# Patient Record
Sex: Male | Born: 1940 | Hispanic: No | Marital: Married | Smoking: Former smoker
Health system: Southern US, Community
[De-identification: ages and names within clinical notes are randomized; demographics above are authoritative.]

## PROBLEM LIST (undated history)

## (undated) DIAGNOSIS — I10 Essential (primary) hypertension: Secondary | ICD-10-CM

## (undated) DIAGNOSIS — N189 Chronic kidney disease, unspecified: Secondary | ICD-10-CM

## (undated) DIAGNOSIS — R918 Other nonspecific abnormal finding of lung field: Secondary | ICD-10-CM

## (undated) DIAGNOSIS — E1169 Type 2 diabetes mellitus with other specified complication: Secondary | ICD-10-CM

## (undated) DIAGNOSIS — E119 Type 2 diabetes mellitus without complications: Secondary | ICD-10-CM

## (undated) DIAGNOSIS — K219 Gastro-esophageal reflux disease without esophagitis: Secondary | ICD-10-CM

## (undated) HISTORY — DX: Other nonspecific abnormal finding of lung field: R91.8

## (undated) HISTORY — DX: Type 2 diabetes mellitus with other specified complication: E11.69

## (undated) HISTORY — PX: OTHER SURGICAL HISTORY: SHX169

## (undated) HISTORY — DX: Chronic kidney disease, unspecified: N18.9

---

## 2020-06-11 DIAGNOSIS — N183 Chronic kidney disease, stage 3 unspecified: Secondary | ICD-10-CM | POA: Insufficient documentation

## 2020-06-11 DIAGNOSIS — K219 Gastro-esophageal reflux disease without esophagitis: Secondary | ICD-10-CM | POA: Insufficient documentation

## 2020-06-11 DIAGNOSIS — Z79899 Other long term (current) drug therapy: Secondary | ICD-10-CM | POA: Insufficient documentation

## 2020-06-14 ENCOUNTER — Other Ambulatory Visit: Payer: Self-pay | Admitting: Internal Medicine

## 2020-06-14 DIAGNOSIS — R918 Other nonspecific abnormal finding of lung field: Secondary | ICD-10-CM

## 2020-06-20 DIAGNOSIS — C3491 Malignant neoplasm of unspecified part of right bronchus or lung: Secondary | ICD-10-CM

## 2020-06-20 HISTORY — DX: Malignant neoplasm of unspecified part of right bronchus or lung: C34.91

## 2020-06-28 ENCOUNTER — Other Ambulatory Visit: Payer: Self-pay

## 2020-06-28 ENCOUNTER — Ambulatory Visit
Admission: RE | Admit: 2020-06-28 | Discharge: 2020-06-28 | Disposition: A | Discharge status: Home / Self Care | Payer: Medicare PPO | Source: Ambulatory Visit | Attending: Internal Medicine | Admitting: Internal Medicine

## 2020-06-28 DIAGNOSIS — R918 Other nonspecific abnormal finding of lung field: Secondary | ICD-10-CM | POA: Diagnosis present

## 2020-06-28 HISTORY — DX: Type 2 diabetes mellitus without complications: E11.9

## 2020-06-28 HISTORY — DX: Essential (primary) hypertension: I10

## 2020-06-28 MED ORDER — IOHEXOL 300 MG/ML  SOLN
75.0000 mL | Freq: Once | INTRAMUSCULAR | Status: AC | PRN
  Administered 2020-06-28: 75 mL via INTRAVENOUS

## 2020-07-04 ENCOUNTER — Other Ambulatory Visit: Payer: Self-pay

## 2020-07-04 ENCOUNTER — Encounter: Payer: Self-pay | Admitting: Internal Medicine

## 2020-07-04 ENCOUNTER — Other Ambulatory Visit: Payer: Self-pay | Admitting: *Deleted

## 2020-07-05 ENCOUNTER — Encounter (INDEPENDENT_AMBULATORY_CARE_PROVIDER_SITE_OTHER): Payer: Self-pay

## 2020-07-05 ENCOUNTER — Inpatient Hospital Stay: Payer: Medicare PPO

## 2020-07-05 ENCOUNTER — Encounter: Payer: Self-pay | Admitting: Internal Medicine

## 2020-07-05 ENCOUNTER — Encounter: Payer: Self-pay | Admitting: *Deleted

## 2020-07-05 ENCOUNTER — Inpatient Hospital Stay: Payer: Medicare PPO | Attending: Internal Medicine | Admitting: Internal Medicine

## 2020-07-05 DIAGNOSIS — Z7982 Long term (current) use of aspirin: Secondary | ICD-10-CM | POA: Diagnosis not present

## 2020-07-05 DIAGNOSIS — C3431 Malignant neoplasm of lower lobe, right bronchus or lung: Secondary | ICD-10-CM | POA: Diagnosis not present

## 2020-07-05 DIAGNOSIS — N189 Chronic kidney disease, unspecified: Secondary | ICD-10-CM | POA: Diagnosis not present

## 2020-07-05 DIAGNOSIS — R918 Other nonspecific abnormal finding of lung field: Secondary | ICD-10-CM | POA: Insufficient documentation

## 2020-07-05 DIAGNOSIS — Z87891 Personal history of nicotine dependence: Secondary | ICD-10-CM | POA: Diagnosis not present

## 2020-07-05 DIAGNOSIS — E1122 Type 2 diabetes mellitus with diabetic chronic kidney disease: Secondary | ICD-10-CM | POA: Diagnosis not present

## 2020-07-05 DIAGNOSIS — E785 Hyperlipidemia, unspecified: Secondary | ICD-10-CM | POA: Diagnosis not present

## 2020-07-05 DIAGNOSIS — I2510 Atherosclerotic heart disease of native coronary artery without angina pectoris: Secondary | ICD-10-CM | POA: Insufficient documentation

## 2020-07-05 DIAGNOSIS — K219 Gastro-esophageal reflux disease without esophagitis: Secondary | ICD-10-CM | POA: Diagnosis not present

## 2020-07-05 DIAGNOSIS — Z79899 Other long term (current) drug therapy: Secondary | ICD-10-CM | POA: Diagnosis not present

## 2020-07-05 DIAGNOSIS — I129 Hypertensive chronic kidney disease with stage 1 through stage 4 chronic kidney disease, or unspecified chronic kidney disease: Secondary | ICD-10-CM | POA: Insufficient documentation

## 2020-07-05 LAB — COMPREHENSIVE METABOLIC PANEL
ALT: 34 U/L (ref 0–44)
AST: 18 U/L (ref 15–41)
Albumin: 3.5 g/dL (ref 3.5–5.0)
Alkaline Phosphatase: 67 U/L (ref 38–126)
Anion gap: 10 (ref 5–15)
BUN: 26 mg/dL — ABNORMAL HIGH (ref 8–23)
CO2: 23 mmol/L (ref 22–32)
Calcium: 8.7 mg/dL — ABNORMAL LOW (ref 8.9–10.3)
Chloride: 103 mmol/L (ref 98–111)
Creatinine, Ser: 1.16 mg/dL (ref 0.61–1.24)
GFR calc Af Amer: >60 mL/min (ref >60)
GFR calc non Af Amer: >60 mL/min (ref >60)
Glucose, Bld: 136 mg/dL — ABNORMAL HIGH (ref 70–99)
Potassium: 4 mmol/L (ref 3.5–5.1)
Sodium: 136 mmol/L (ref 135–145)
Total Bilirubin: 0.6 mg/dL (ref 0.3–1.2)
Total Protein: 7.4 g/dL (ref 6.5–8.1)

## 2020-07-05 LAB — CBC WITH DIFFERENTIAL/PLATELET
Abs Immature Granulocytes: 0.02 10*3/uL (ref 0.00–0.07)
Basophils Absolute: 0 10*3/uL (ref 0.0–0.1)
Basophils Relative: 0 %
Eosinophils Absolute: 1.3 10*3/uL — ABNORMAL HIGH (ref 0.0–0.5)
Eosinophils Relative: 14 %
HCT: 41.8 % (ref 39.0–52.0)
Hemoglobin: 14.3 g/dL (ref 13.0–17.0)
Immature Granulocytes: 0 %
Lymphocytes Relative: 18 %
Lymphs Abs: 1.7 10*3/uL (ref 0.7–4.0)
MCH: 29.2 pg (ref 26.0–34.0)
MCHC: 34.2 g/dL (ref 30.0–36.0)
MCV: 85.3 fL (ref 80.0–100.0)
Monocytes Absolute: 0.6 10*3/uL (ref 0.1–1.0)
Monocytes Relative: 7 %
Neutro Abs: 5.8 10*3/uL (ref 1.7–7.7)
Neutrophils Relative %: 61 %
Platelets: 222 10*3/uL (ref 150–400)
RBC: 4.9 MIL/uL (ref 4.22–5.81)
RDW: 13 % (ref 11.5–15.5)
WBC: 9.5 10*3/uL (ref 4.0–10.5)
nRBC: 0 % (ref 0.0–0.2)

## 2020-07-05 LAB — PROTIME-INR
INR: 1.2 (ref 0.8–1.2)
Prothrombin Time: 14.4 seconds (ref 11.4–15.2)

## 2020-07-05 LAB — APTT: aPTT: 33 seconds (ref 24–36)

## 2020-07-05 NOTE — Progress Notes (Signed)
[facility] CONSULT NOTE  Patient Care Team: Ezequiel Kayser, MD as PCP - General (Internal Medicine) Telford Nab, RN as Oncology Nurse Navigator  CHIEF COMPLAINTS/PURPOSE OF CONSULTATION: lung nodule/mass  #  Oncology History Overview Note  # July 2021- RLL- 9.4 cm x 7.5 cm x 6.7 cm lobulated low-attenuation heterogeneous lung mass within the posteromedial aspect of the right lower lobe, at the level of the right hilum; positive for hilar; mediastinal adenopathy  # CAD- 1995 s/p cath; No stents; # ? 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  male history of smoking is here for further evaluation and recommendations for lung mass/nodule.  Patient noted wheezing along with cough over the last few months.  Progressive getting worse; denies any significant shortness of breath.  This led to further evaluation chest x-ray with PCP; which was abnormal.  This led to a CT scan of the chest-as summarized above.  Denies any weight loss.  Denies any headaches.  Any nausea vomiting. No tingling or numbness.   Review of Systems  Constitutional: Negative for chills, diaphoresis, fever, malaise/fatigue and weight loss.  HENT: Negative for nosebleeds and sore throat.   Eyes: Negative for double vision.  Respiratory: Positive for cough and wheezing. Negative for hemoptysis, sputum production and shortness of breath.   Cardiovascular: Negative for chest pain, palpitations, orthopnea and leg swelling.  Gastrointestinal: Negative for abdominal pain, blood in stool, constipation, diarrhea, heartburn, melena, nausea and vomiting.  Genitourinary: Negative for dysuria, frequency and urgency.  Musculoskeletal: Negative for back pain and joint pain.  Skin: Negative.  Negative for itching and rash.  Neurological: Negative for dizziness, tingling,  focal weakness, weakness and headaches.  Endo/Heme/Allergies: Does not bruise/bleed easily.  Psychiatric/Behavioral: Negative for depression. The patient is not nervous/anxious and does not have insomnia.      MEDICAL HISTORY:  Past Medical History:  Diagnosis Date  . Chronic renal insufficiency   . Diabetes mellitus without complication ([place])    Pt states it's diet controlled.  . Hyperlipidemia associated with type 2 diabetes mellitus ([place])   . Hypertension   . Lung mass     SURGICAL HISTORY: History reviewed. No pertinent surgical history.  SOCIAL HISTORY: Social History   Socioeconomic History  . Marital status: Married    Spouse name: Not on file  . Number of children: Not on file  . Years of education: Not on file  . Highest education level: Not on file  Occupational History  . Not on file  Tobacco Use  . Smoking status: Not on file  Substance and Sexual Activity  . Alcohol use: Not on file  . Drug use: Not on file  . Sexual activity: Not on file  Other Topics Concern  . Not on file  Social History Narrative   Moved from [place]; lives in [place] in 2021; daughter lives next door. Quit smoking- 13 years ago. Social alcohol. 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    Social Determinants of Health   Financial Resource Strain:   . Difficulty of Paying Living Expenses:   Food Insecurity:   . Worried About Charity fundraiser in the Last Year:   . Arboriculturist in the Last Year:   Transportation Needs:   . Lack of Transportation (  Medical):   Marland Kitchen Lack of Transportation (Non-Medical):   Physical Activity:   . Days of Exercise per Week:   . Minutes of Exercise per Session:   Stress:   . Feeling of Stress :   Social Connections:   . Frequency of Communication with Friends and Family:   . Frequency of Social Gatherings with Friends and Family:   . Attends Religious Services:   . Active Member of Clubs or Organizations:   . Attends  Archivist Meetings:   Marland Kitchen Marital Status:   Intimate Partner Violence:   . Fear of Current or Ex-Partner:   . Emotionally Abused:   Marland Kitchen Physically Abused:   . Sexually Abused:     FAMILY HISTORY: Family History  Problem Relation Age of Onset  . Liver cancer Sister   . Cancer - Other Brother        bone cancer [2001]    ALLERGIES:  is allergic to niacin and related.  MEDICATIONS:  Current Outpatient Medications  Medication Sig Dispense Refill  . albuterol (VENTOLIN HFA) 108 (90 Base) MCG/ACT inhaler Inhale 2 puffs into the lungs in the morning, at noon, in the evening, and at bedtime.    Marland Kitchen aspirin 325 MG EC tablet Take 1 tablet by mouth daily.    Marland Kitchen atenolol (TENORMIN) 25 MG tablet Take 1 tablet by mouth daily.    Marland Kitchen atorvastatin (LIPITOR) 40 MG tablet Take 1 tablet by mouth daily.    . hydrochlorothiazide (HYDRODIURIL) 12.5 MG tablet Take 12.5 mg by mouth daily.    Marland Kitchen losartan (COZAAR) 50 MG tablet Take 1 tablet by mouth daily.    . Melatonin 10 MG TABS Take 1 tablet by mouth at bedtime.    . Omega-3 1000 MG CAPS Take 1,000 mg by mouth in the morning and at bedtime.    Marland Kitchen omeprazole (PRILOSEC) 20 MG capsule Take 20 mg by mouth in the morning and at bedtime.    . tadalafil (CIALIS) 20 MG tablet Take 1 tablet by mouth daily.    . Zinc 22.5 MG TABS Take 22 mg by mouth daily.    Marland Kitchen acetaminophen (TYLENOL) 500 MG tablet Take 2 tablets by mouth every 8 (eight) hours. (Patient not taking: Reported on 07/04/2020)     No current facility-administered medications for this visit.      Marland Kitchen  PHYSICAL EXAMINATION: ECOG PERFORMANCE STATUS: 1 - Symptomatic but completely ambulatory  Vitals:   07/05/20 0900  BP: 139/62  Pulse: 62  Temp: (!) 97.1 F (36.2 C)  SpO2: 98%   Filed Weights   07/05/20 0900  Weight: 229 lb 3.2 oz (104 kg)    Physical Exam Constitutional:      Comments: Ambulating independently.  Accompanied by his wife.  HENT:     Head: Normocephalic and  atraumatic.     Mouth/Throat:     Pharynx: No oropharyngeal exudate.  Eyes:     Pupils: Pupils are equal, round, and reactive to light.  Cardiovascular:     Rate and Rhythm: Normal rate and regular rhythm.  Pulmonary:     Effort: No respiratory distress.     Breath sounds: No wheezing.     Comments: Positive for wheezing mostly on the right side. Abdominal:     General: Bowel sounds are normal. There is no distension.     Palpations: Abdomen is soft. There is no mass.     Tenderness: There is no abdominal tenderness. There is no guarding or rebound.  Musculoskeletal:        General: No tenderness. Normal range of motion.     Cervical back: Normal range of motion and neck supple.  Skin:    General: Skin is warm.  Neurological:     Mental Status: He is alert and oriented to person, place, and time.  Psychiatric:        Mood and Affect: Affect normal.      LABORATORY DATA:  I have reviewed the data as listed Lab Results  Component Value Date   WBC 9.5 07/05/2020   HGB 14.3 07/05/2020   HCT 41.8 07/05/2020   MCV 85.3 07/05/2020   PLT 222 07/05/2020   Recent Labs    07/05/20 1002  NA 136  K 4.0  CL 103  CO2 23  GLUCOSE 136*  BUN 26*  CREATININE 1.16  CALCIUM 8.7*  GFRNONAA >60  GFRAA >60  PROT 7.4  ALBUMIN 3.5  AST 18  ALT 34  ALKPHOS 67  BILITOT 0.6    RADIOGRAPHIC STUDIES: I have personally reviewed the radiological images as listed and agreed with the findings in the report. CT CHEST W CONTRAST  Result Date: 06/28/2020 CLINICAL DATA:  Wheezing with congestion x6 months. EXAM: CT CHEST WITH CONTRAST TECHNIQUE: Multidetector CT imaging of the chest was performed during intravenous contrast administration. CONTRAST:  78mL OMNIPAQUE IOHEXOL 300 MG/ML  SOLN COMPARISON:  None. FINDINGS: Cardiovascular: No significant vascular findings. Normal heart size. No pericardial effusion. Mediastinum/Nodes: There is mild right hilar and mediastinal lymphadenopathy. Thyroid  gland, trachea, and esophagus demonstrate no significant findings. Lungs/Pleura: A 9.4 cm x 7.5 cm x 6.7 cm lobulated low-attenuation heterogeneous lung mass is seen within the posteromedial aspect of the right lower lobe, at the level of the right hilum. There is no evidence of a pleural effusion or pneumothorax. Upper Abdomen: Multiple simple cysts are seen within both kidneys. Musculoskeletal: No chest wall abnormality. No acute or significant osseous findings. IMPRESSION: 1. 9.4 cm x 7.5 cm x 6.7 cm lobulated low-attenuation heterogeneous lung mass within the posteromedial aspect of the right lower lobe, at the level of the right hilum. This is concerning for a primary lung malignancy. 2. Mild right hilar and mediastinal lymphadenopathy. 3. Multiple simple cysts within both kidneys. Electronically Signed   By: Virgina Norfolk M.D.   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  No distant disease noted on the CT of the chest.  Would recommend a PET scan for further evaluation.  Also recommend a biopsy of the lung mass.  #Clinically based on imaging-concerning for stage III lung cancer.  Discussed the treatment options usually includes chemoradiation indefinitely.  Surgery less likely; however not ruled out unless absolutely after above staging/biopsy.  Discussed with Hildred Alamin.  # CAD- 1995 s/p cath; No stents-clinically stable.  Discussed regarding holding aspirin approximately 5 days prior to biopsy.  Thank you Dr.Theis for allowing me to participate in the care of your pleasant patient. Please do not hesitate to contact me with questions or concerns in the interim.  The above plan of care was discussed with patient and wife in detail.  Patient declined my offer to speak to rest of the family.  # DISPOSITION: # labs today- cbc/cmp/CEA/PT/PTT- please order # PET ASAP #  CT guided Lung mass Bx # Follow up in 3 days  post Biopsy- Dr.B  # I reviewed the blood work- with the patient in detail; also reviewed the imaging independently [as summarized above]; and with the patient in detail.     All questions were answered. The patient knows to call the clinic with any problems, questions or concerns.    Cammie Sickle, MD 07/08/2020 8:13 AM

## 2020-07-05 NOTE — Assessment & Plan Note (Addendum)
#   RLL mass- ~9 cm; with hilar/mediastinal adenopathy.  No distant disease noted on the CT of the chest.  Would recommend a PET scan for further evaluation.  Also recommend a biopsy of the lung mass.  #Clinically based on imaging-concerning for stage III lung cancer.  Discussed the treatment options usually includes chemoradiation indefinitely.  Surgery less likely; however not ruled out unless absolutely after above staging/biopsy.  Discussed with Hildred Alamin.  # CAD- 1995 s/p cath; No stents-clinically stable.  Discussed regarding holding aspirin approximately 5 days prior to biopsy.  Thank you Dr.Theis for allowing me to participate in the care of your pleasant patient. Please do not hesitate to contact me with questions or concerns in the interim.  The above plan of care was discussed with patient and wife in detail.  Patient declined my offer to speak to rest of the family.  # DISPOSITION: # labs today- cbc/cmp/CEA/PT/PTT- please order # PET ASAP # CT guided Lung mass Bx # Follow up in 3 days post Biopsy- Dr.B  # I reviewed the blood work- with the patient in detail; also reviewed the imaging independently [as summarized above]; and with the patient in detail.

## 2020-07-05 NOTE — Progress Notes (Signed)
  Oncology Nurse Navigator Documentation  Navigator Location: [facility] (07/05/20 1300) Referral Date to RadOnc/MedOnc: 07/04/20 (07/05/20 1300) )Navigator Encounter Type: Clinic/MDC (07/05/20 1300)   Abnormal Finding Date: 06/28/20 (07/05/20 1300)           Multidisiplinary Clinic Date: 07/05/20 (07/05/20 1300) Multidisiplinary Clinic Type: Thoracic (07/05/20 1300)   Patient Visit Type: MedOnc (07/05/20 1300) Treatment Phase: Abnormal Scans (07/05/20 1300) Barriers/Navigation Needs: Coordination of Care (07/05/20 1300)   Interventions: Coordination of Care (07/05/20 1300)   Coordination of Care: Appts;Radiology (07/05/20 1300)        Acuity: Level 2-Minimal Needs (1-2 Barriers Identified) (07/05/20 1300)  met with patient during initial consult with Dr. Rogue Bussing. All questions answered during visit. Reviewed upcoming appts. Pt informed that will be notified once biopsy has been scheduled. Contact info given and instructed to call with any further questions or needs. Pt verbalized understanding.        Time Spent with Patient: 60 (07/05/20 1300)

## 2020-07-06 LAB — CEA: CEA: 2.7 ng/mL (ref 0.0–4.7)

## 2020-07-08 ENCOUNTER — Encounter: Payer: Self-pay | Admitting: Internal Medicine

## 2020-07-11 ENCOUNTER — Other Ambulatory Visit: Payer: Medicare PPO

## 2020-07-11 ENCOUNTER — Ambulatory Visit
Admission: RE | Admit: 2020-07-11 | Discharge: 2020-07-11 | Disposition: A | Discharge status: Home / Self Care | Payer: Medicare PPO | Source: Ambulatory Visit | Attending: Internal Medicine | Admitting: Internal Medicine

## 2020-07-11 ENCOUNTER — Ambulatory Visit: Payer: Medicare PPO

## 2020-07-11 ENCOUNTER — Telehealth: Payer: Self-pay | Admitting: Internal Medicine

## 2020-07-11 ENCOUNTER — Other Ambulatory Visit: Payer: Self-pay

## 2020-07-11 DIAGNOSIS — R590 Localized enlarged lymph nodes: Secondary | ICD-10-CM | POA: Diagnosis not present

## 2020-07-11 DIAGNOSIS — I2510 Atherosclerotic heart disease of native coronary artery without angina pectoris: Secondary | ICD-10-CM | POA: Insufficient documentation

## 2020-07-11 DIAGNOSIS — C3490 Malignant neoplasm of unspecified part of unspecified bronchus or lung: Secondary | ICD-10-CM | POA: Insufficient documentation

## 2020-07-11 DIAGNOSIS — C7972 Secondary malignant neoplasm of left adrenal gland: Secondary | ICD-10-CM | POA: Diagnosis not present

## 2020-07-11 DIAGNOSIS — I700 Atherosclerosis of aorta: Secondary | ICD-10-CM | POA: Diagnosis not present

## 2020-07-11 DIAGNOSIS — R918 Other nonspecific abnormal finding of lung field: Secondary | ICD-10-CM | POA: Diagnosis not present

## 2020-07-11 LAB — GLUCOSE, CAPILLARY: Glucose-Capillary: 103 mg/dL — ABNORMAL HIGH (ref 70–99)

## 2020-07-11 MED ORDER — FLUDEOXYGLUCOSE F - 18 (FDG) INJECTION
11.9000 | Freq: Once | INTRAVENOUS | Status: AC | PRN
  Administered 2020-07-11: 11.9 via INTRAVENOUS

## 2020-07-11 NOTE — Telephone Encounter (Signed)
Pt is aware of the plan at this time. Per specialty scheduling, they will work on getting him in for biopsy early next week. I will give him a call to inform him of his appt for biopsy once he is scheduled. Thank you!

## 2020-07-11 NOTE — Telephone Encounter (Signed)
hayley- please reach out to pt today re: plan for biopsy under CT guidance as per recommendations from Tumor conference.   Also, please order the CT guided Biopsy ASAP.  I will reach out to pt also later in the afternoon.  Thanks, GB

## 2020-07-12 ENCOUNTER — Ambulatory Visit: Payer: Medicare PPO | Admitting: Internal Medicine

## 2020-07-12 NOTE — Progress Notes (Signed)
Tumor Board Documentation  Ralph Williams was presented by Dr Rogue Bussing at our Tumor Board on 07/11/2020, which included representatives from medical oncology, radiation oncology, internal medicine, navigation, pathology, radiology, surgical, pharmacy, genetics, palliative care, pulmonology.  Ralph Williams currently presents as a new patient, for discussion with history of the following treatments: active survellience.  Additionally, we reviewed previous medical and familial history, history of present illness, and recent lab results along with all available histopathologic and imaging studies. The tumor board considered available treatment options and made the following recommendations: Biopsy, Additional screening (CT Guided Core Biopsy, MRI Brain)    The following procedures/referrals were also placed: No orders of the defined types were placed in this encounter.   Clinical Trial Status: not discussed   Staging used:    AJCC Staging:       Group: Cancer of Right Lower Lobe of Lung   National site-specific guidelines   were discussed with respect to the case.  Tumor board is a meeting of clinicians from various specialty areas who evaluate and discuss patients for whom a multidisciplinary approach is being considered. Final determinations in the plan of care are those of the provider(s). The responsibility for follow up of recommendations given during tumor board is that of the provider.   Today's extended care, comprehensive team conference, Ralph Williams was not present for the discussion and was not examined.   Multidisciplinary Tumor Board is a multidisciplinary case peer review process.  Decisions discussed in the Multidisciplinary Tumor Board reflect the opinions of the specialists present at the conference without having examined the patient.  Ultimately, treatment and diagnostic decisions rest with the primary provider(s) and the patient.

## 2020-07-15 ENCOUNTER — Other Ambulatory Visit: Payer: Self-pay | Admitting: Radiology

## 2020-07-15 ENCOUNTER — Other Ambulatory Visit: Payer: Self-pay | Admitting: Student

## 2020-07-16 ENCOUNTER — Other Ambulatory Visit: Payer: Self-pay

## 2020-07-16 ENCOUNTER — Ambulatory Visit
Admission: RE | Admit: 2020-07-16 | Discharge: 2020-07-16 | Disposition: A | Discharge status: Home / Self Care | Payer: Medicare PPO | Source: Ambulatory Visit | Attending: Interventional Radiology | Admitting: Interventional Radiology

## 2020-07-16 ENCOUNTER — Ambulatory Visit
Admission: RE | Admit: 2020-07-16 | Discharge: 2020-07-16 | Disposition: A | Discharge status: Home / Self Care | Payer: Medicare PPO | Source: Ambulatory Visit | Attending: Internal Medicine | Admitting: Internal Medicine

## 2020-07-16 DIAGNOSIS — C3431 Malignant neoplasm of lower lobe, right bronchus or lung: Secondary | ICD-10-CM | POA: Diagnosis present

## 2020-07-16 DIAGNOSIS — Z7982 Long term (current) use of aspirin: Secondary | ICD-10-CM | POA: Diagnosis not present

## 2020-07-16 DIAGNOSIS — I10 Essential (primary) hypertension: Secondary | ICD-10-CM | POA: Diagnosis not present

## 2020-07-16 DIAGNOSIS — C7972 Secondary malignant neoplasm of left adrenal gland: Secondary | ICD-10-CM | POA: Insufficient documentation

## 2020-07-16 DIAGNOSIS — Z87891 Personal history of nicotine dependence: Secondary | ICD-10-CM | POA: Insufficient documentation

## 2020-07-16 DIAGNOSIS — N281 Cyst of kidney, acquired: Secondary | ICD-10-CM | POA: Diagnosis not present

## 2020-07-16 DIAGNOSIS — I2510 Atherosclerotic heart disease of native coronary artery without angina pectoris: Secondary | ICD-10-CM | POA: Insufficient documentation

## 2020-07-16 DIAGNOSIS — Z79899 Other long term (current) drug therapy: Secondary | ICD-10-CM | POA: Diagnosis not present

## 2020-07-16 DIAGNOSIS — E785 Hyperlipidemia, unspecified: Secondary | ICD-10-CM | POA: Diagnosis not present

## 2020-07-16 DIAGNOSIS — I700 Atherosclerosis of aorta: Secondary | ICD-10-CM | POA: Insufficient documentation

## 2020-07-16 DIAGNOSIS — Z9889 Other specified postprocedural states: Secondary | ICD-10-CM

## 2020-07-16 DIAGNOSIS — E119 Type 2 diabetes mellitus without complications: Secondary | ICD-10-CM | POA: Insufficient documentation

## 2020-07-16 DIAGNOSIS — N289 Disorder of kidney and ureter, unspecified: Secondary | ICD-10-CM | POA: Diagnosis not present

## 2020-07-16 DIAGNOSIS — R590 Localized enlarged lymph nodes: Secondary | ICD-10-CM | POA: Insufficient documentation

## 2020-07-16 DIAGNOSIS — R918 Other nonspecific abnormal finding of lung field: Secondary | ICD-10-CM

## 2020-07-16 HISTORY — DX: Gastro-esophageal reflux disease without esophagitis: K21.9

## 2020-07-16 LAB — PROTIME-INR
INR: 1.2 (ref 0.8–1.2)
Prothrombin Time: 14.4 seconds (ref 11.4–15.2)

## 2020-07-16 LAB — CBC
HCT: 41.6 % (ref 39.0–52.0)
Hemoglobin: 13.8 g/dL (ref 13.0–17.0)
MCH: 29 pg (ref 26.0–34.0)
MCHC: 33.2 g/dL (ref 30.0–36.0)
MCV: 87.4 fL (ref 80.0–100.0)
Platelets: 221 10*3/uL (ref 150–400)
RBC: 4.76 MIL/uL (ref 4.22–5.81)
RDW: 12.9 % (ref 11.5–15.5)
WBC: 10.3 10*3/uL (ref 4.0–10.5)
nRBC: 0 % (ref 0.0–0.2)

## 2020-07-16 MED ORDER — FENTANYL CITRATE (PF) 100 MCG/2ML IJ SOLN
INTRAMUSCULAR | Status: AC
  Filled 2020-07-16: qty 2

## 2020-07-16 MED ORDER — MIDAZOLAM HCL 2 MG/2ML IJ SOLN
INTRAMUSCULAR | Status: AC
  Filled 2020-07-16: qty 4

## 2020-07-16 MED ORDER — SODIUM CHLORIDE 0.9 % IV SOLN: INTRAVENOUS | Status: DC

## 2020-07-16 MED ORDER — MIDAZOLAM HCL 2 MG/2ML IJ SOLN
INTRAMUSCULAR | Status: AC | PRN
  Administered 2020-07-16 (×2): 0.5 mg via INTRAVENOUS

## 2020-07-16 MED ORDER — FENTANYL CITRATE (PF) 100 MCG/2ML IJ SOLN
INTRAMUSCULAR | Status: AC | PRN
  Administered 2020-07-16 (×2): 25 ug via INTRAVENOUS

## 2020-07-16 NOTE — H&P (Signed)
Chief Complaint: Patient was seen in consultation today for right lower lobe lung mass biopsy.  Referring Physician(s): Cammie Sickle  Supervising Physician: Sandi Mariscal  Patient Status: [facility] - Out-pt  History of Present Illness: Ralph Williams is a 79 y.o. male with a past medical history significant for renal insufficiency, HTN, HLD, DM and tobacco use who presents today for a right lower lobe lung mass biopsy. Ralph Williams began to experienced a persistent cough associated with wheezing for several months which progressively worsened. Ralph Williams was seen by his PCP and had a CXR which was noted to be abnormal so a follow up CT chest with contrast was obtained on 7/9. Results of this CT showed a 9.4 x 7.5 x 6.7 cm lobulated low-attenuation heterogeneous lung mass within the posteromedial aspect of the RLL at the level of the right hilum, concerning for primary lung malignancy. 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 IR has been asked to perform a percutaneous biopsy of the RLL mass to further direct care.  Ralph Williams denies any complaints today and is in good spirits. Ralph Williams is very pleased that Ralph Williams will have medicine to keep him comfortable during the biopsy. Ralph Williams states understanding of the procedure as well as the need for possible chest tube placement and is agreeable to proceed as planned.  Past Medical History:  Diagnosis Date  . Chronic renal insufficiency   . Diabetes mellitus without complication ([place])    Pt states it's diet controlled.  . Hyperlipidemia associated with type 2 diabetes mellitus ([place])   . Hypertension   . Lung mass     No past surgical history on file.  Allergies: Niacin and related  Medications: Prior to Admission  medications   Medication Sig Start Date End Date Taking? Authorizing Provider  acetaminophen (TYLENOL) 500 MG tablet Take 2 tablets by mouth every 8 (eight) hours. Patient not taking: Reported on 07/04/2020    [provider]  albuterol (VENTOLIN HFA) 108 (90 Base) MCG/ACT inhaler Inhale 2 puffs into the lungs in the morning, at noon, in the evening, and at bedtime. 06/11/20 06/11/21  [provider]  aspirin 325 MG EC tablet Take 1 tablet by mouth daily.    [provider]  atenolol (TENORMIN) 25 MG tablet Take 1 tablet by mouth daily. 06/11/20   [provider]  atorvastatin (LIPITOR) 40 MG tablet Take 1 tablet by mouth daily. 06/11/20   [provider]  hydrochlorothiazide (HYDRODIURIL) 12.5 MG tablet Take 12.5 mg by mouth daily. 06/11/20   [provider]  losartan (COZAAR) 50 MG tablet Take 1 tablet by mouth daily. 06/11/20   [provider]  Melatonin 10 MG TABS Take 1 tablet by mouth at bedtime.    [provider]  Omega-3 1000 MG CAPS Take 1,000 mg by mouth in the morning and at bedtime.    [provider]  omeprazole (PRILOSEC) 20 MG capsule Take 20 mg by mouth in the morning and at bedtime. 02/09/18   [provider]  tadalafil (CIALIS) 20 MG tablet Take 1 tablet by mouth daily.    [provider]  Zinc 22.5 MG TABS Take 22 mg by mouth daily.    [provider]     Family History  Problem Relation Age of Onset  .  Liver cancer Sister   . Cancer - Other Brother        bone cancer [2001]    Social History   Socioeconomic History  . Marital status: Married    Spouse name: Not on file  . Number of children: Not on file  . Years of education: Not on file  . Highest education level: Not on file  Occupational History  . Not on file  Tobacco Use  . Smoking status: Not on file  Substance and Sexual Activity  . Alcohol use: Not on file  . Drug use: Not on file  . Sexual activity:  Not on file  Other Topics Concern  . Not on file  Social History Narrative   Moved from [place]; lives in [place] in 2021; daughter lives next door. Quit smoking- 13 years ago. Social alcohol. 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    Social Determinants of Health   Financial Resource Strain:   . Difficulty of Paying Living Expenses:   Food Insecurity:   . Worried About Charity fundraiser in the Last Year:   . Arboriculturist in the Last Year:   Transportation Needs:   . Film/video editor (Medical):   Marland Kitchen Lack of Transportation (Non-Medical):   Physical Activity:   . Days of Exercise per Week:   . Minutes of Exercise per Session:   Stress:   . Feeling of Stress :   Social Connections:   . Frequency of Communication with Friends and Family:   . Frequency of Social Gatherings with Friends and Family:   . Attends Religious Services:   . Active Member of Clubs or Organizations:   . Attends Archivist Meetings:   Marland Kitchen Marital Status:      Review of Systems: A 12 point ROS discussed and pertinent positives are indicated in the HPI above.  All other systems are negative.  Review of Systems  Constitutional: Negative for chills and fever.  Respiratory: Positive for cough. Negative for shortness of breath.   Cardiovascular: Negative for chest pain.  Gastrointestinal: Negative for abdominal pain, diarrhea, nausea and vomiting.  Genitourinary: Negative for dysuria and hematuria.  Musculoskeletal: Negative for back pain.  Neurological: Negative for dizziness and headaches.    Vital Signs: BP (!) 131/63   Pulse 55   Resp 20   SpO2 97%   Physical Exam Vitals reviewed.  Constitutional:      General: Ralph Williams is not in acute distress.    Comments: Very pleasant, good historian.  HENT:     Mouth/Throat:     Mouth: Mucous membranes are moist.     Pharynx: Oropharynx is clear. No oropharyngeal exudate or posterior oropharyngeal erythema.   Cardiovascular:     Rate and Rhythm: Normal rate and regular rhythm.  Pulmonary:     Effort: Pulmonary effort is normal.     Breath sounds: Normal breath sounds.  Abdominal:     General: Bowel sounds are normal. There is no distension.     Palpations: Abdomen is soft.     Tenderness: There is no abdominal tenderness.  Skin:    General: Skin is warm and dry.  Neurological:     Mental Status: Ralph Williams is alert and oriented to person, place, and time.  Psychiatric:        Mood and Affect: Mood normal.        Behavior: Behavior normal.        Thought Content: Thought content normal.  Judgment: Judgment normal.      MD Evaluation Airway: WNL Heart: WNL Abdomen: WNL Chest/ Lungs: WNL ASA  Classification: 3 Mallampati/Airway Score: One   Imaging: CT CHEST W CONTRAST  Result Date: 06/28/2020 CLINICAL DATA:  Wheezing with congestion x6 months. EXAM: CT CHEST WITH CONTRAST TECHNIQUE: Multidetector CT imaging of the chest was performed during intravenous contrast administration. CONTRAST:  7mL OMNIPAQUE IOHEXOL 300 MG/ML  SOLN COMPARISON:  None. FINDINGS: Cardiovascular: No significant vascular findings. Normal heart size. No pericardial effusion. Mediastinum/Nodes: There is mild right hilar and mediastinal lymphadenopathy. Thyroid gland, trachea, and esophagus demonstrate no significant findings. Lungs/Pleura: A 9.4 cm x 7.5 cm x 6.7 cm lobulated low-attenuation heterogeneous lung mass is seen within the posteromedial aspect of the right lower lobe, at the level of the right hilum. There is no evidence of a pleural effusion or pneumothorax. Upper Abdomen: Multiple simple cysts are seen within both kidneys. Musculoskeletal: No chest wall abnormality. No acute or significant osseous findings. IMPRESSION: 1. 9.4 cm x 7.5 cm x 6.7 cm lobulated low-attenuation heterogeneous lung mass within the posteromedial aspect of the right lower lobe, at the level of the right hilum. This is concerning for  a primary lung malignancy. 2. Mild right hilar and mediastinal lymphadenopathy. 3. Multiple simple cysts within both kidneys. Electronically Signed   By: Virgina Norfolk M.D.   On: 06/28/2020 20:37   NM PET Image Initial (PI) Skull Base To Thigh  Result Date: 07/11/2020 CLINICAL DATA:  Initial treatment strategy for chest CT demonstrating right lower lobe lung mass, thoracic adenopathy. EXAM: NUCLEAR MEDICINE PET SKULL BASE TO THIGH TECHNIQUE: 12.6 mCi F-18 FDG was injected intravenously. Full-ring PET imaging was performed from the skull base to thigh after the radiotracer. CT data was obtained and used for attenuation correction and anatomic localization. Fasting blood glucose: 103 mg/dl COMPARISON:  Chest CT 06/28/2020 FINDINGS: Mediastinal blood pool activity: SUV max 3.0 Liver activity: SUV max NA NECK: No areas of abnormal hypermetabolism. Incidental CT findings: Right maxillary sinus mucous retention cyst or polyp. No cervical adenopathy. Left carotid atherosclerosis. CHEST: Bilateral mediastinal hypermetabolic adenopathy. Example AP window node at 1.2 cm and a S.U.V. max of 11.4 on 79/3. Subcarinal nodal mass measures 2.6 cm and a S.U.V. max of 13.7 on 93/3. Similar to on the prior exam. The central right lower lobe lung mass with direct mediastinal invasion measures 8.8 x 6.7 cm and a S.U.V. max of 18.8 on 102/3. Incidental CT findings: Aortic and coronary artery atherosclerosis. ABDOMEN/PELVIS: Left adrenal nodule measures 9 mm and a S.U.V. max of 7.1 on 143/3. This is similar in size to on the prior diagnostic CT. No abdominopelvic nodal hypermetabolism. Low anal hypermetabolism is without CT correlate and favored to be physiologic. Incidental CT findings: Aortic atherosclerosis. An upper pole right renal 1.4 cm lesion measures 30 HU on 148/3, up to 76 HU on the prior, postcontrast CT. An upper pole left renal lesion measures 25 HU on 148/3, up to 53 HU on the prior diagnostic CT. 1.4 cm. SKELETON:  No abnormal marrow activity. Incidental CT findings: No acute osseous abnormality. IMPRESSION: 1. 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 2. Bilateral indeterminate renal lesions. Suspicious for solid renal cell carcinoma or carcinomas, given comparison to prior contrast enhanced chest CT. These could either be re-evaluated at follow-up or more entirely characterized with pre and post contrast renal protocol CT or MRI. 3. Incidental findings, including: Coronary artery atherosclerosis. Aortic Atherosclerosis (  ICD10-I70.0). Sinus disease. Electronically Signed   By: Abigail Miyamoto M.D.   On: 07/11/2020 14:22    Labs:  CBC: Recent Labs    07/05/20 1002  WBC 9.5  HGB 14.3  HCT 41.8  PLT 222    COAGS: Recent Labs    07/05/20 1002  INR 1.2  APTT 33    BMP: Recent Labs    07/05/20 1002  NA 136  K 4.0  CL 103  CO2 23  GLUCOSE 136*  BUN 26*  CALCIUM 8.7*  CREATININE 1.16  GFRNONAA >60  GFRAA >60    LIVER FUNCTION TESTS: Recent Labs    07/05/20 1002  BILITOT 0.6  AST 18  ALT 34  ALKPHOS 67  PROT 7.4  ALBUMIN 3.5    TUMOR MARKERS: No results for input(s): AFPTM, CEA, CA199, CHROMGRNA in the last 8760 hours.  Assessment and Plan:  79 y/o M with approximately 6 months of worsening cough and wheezing found to have a right lower lobe lung mass. IR has been asked by oncology to perform a percutaneous biopsy to further direct care.  Patient has been NPO since midnight except for a small sip of water with his AM medications, no current anticoagulation/antiplatelet medications. Afebrile, WBC 10.3, hgb 13.8, plt 221, INR pending at time of this note writing.  Risks and benefits discussed with the patient including, but not limited to bleeding, hemoptysis, respiratory failure requiring intubation, infection, pneumothorax requiring chest tube placement, stroke from air embolism or even death.  All of the  patient's questions were answered, patient is agreeable to proceed.  Consent signed and in chart.  Thank you for this interesting consult.  I greatly enjoyed meeting Ralph Williams and look forward to participating in their care.  A copy of this report was sent to the requesting provider on this date.  Electronically Signed: Joaquim Nam, PA-C 07/16/2020, 8:08 AM   I spent a total of 30 Minutes  in face to face in clinical consultation, greater than 50% of which was counseling/coordinating care for RLL mass biopsy.

## 2020-07-16 NOTE — Procedures (Signed)
Pre procedural Dx: Right lower lobe mass Post procedural Dx: Same  Technically successful CT guided biopsy of right lower lobe pulmonary mass   EBL: None.   Complications: None immediate.   Ronny Bacon, MD Pager #: [phone]

## 2020-07-18 ENCOUNTER — Other Ambulatory Visit: Payer: Self-pay | Admitting: Internal Medicine

## 2020-07-18 LAB — SURGICAL PATHOLOGY

## 2020-07-19 ENCOUNTER — Other Ambulatory Visit: Payer: Self-pay | Admitting: Student

## 2020-07-19 ENCOUNTER — Encounter: Payer: Self-pay | Admitting: *Deleted

## 2020-07-19 ENCOUNTER — Inpatient Hospital Stay (HOSPITAL_BASED_OUTPATIENT_CLINIC_OR_DEPARTMENT_OTHER): Payer: Medicare PPO | Admitting: Internal Medicine

## 2020-07-19 ENCOUNTER — Other Ambulatory Visit: Payer: Self-pay

## 2020-07-19 DIAGNOSIS — C3431 Malignant neoplasm of lower lobe, right bronchus or lung: Secondary | ICD-10-CM | POA: Diagnosis not present

## 2020-07-19 DIAGNOSIS — R918 Other nonspecific abnormal finding of lung field: Secondary | ICD-10-CM | POA: Diagnosis not present

## 2020-07-19 DIAGNOSIS — C3490 Malignant neoplasm of unspecified part of unspecified bronchus or lung: Secondary | ICD-10-CM | POA: Diagnosis not present

## 2020-07-19 DIAGNOSIS — Z7189 Other specified counseling: Secondary | ICD-10-CM | POA: Diagnosis not present

## 2020-07-19 MED ORDER — PROCHLORPERAZINE MALEATE 10 MG PO TABS
10.0000 mg | ORAL_TABLET | Freq: Four times a day (QID) | ORAL | 1 refills | Status: DC | PRN
Start: 2020-07-19 — End: 2021-01-13

## 2020-07-19 MED ORDER — ONDANSETRON HCL 8 MG PO TABS: ORAL_TABLET | ORAL | 1 refills | Status: AC

## 2020-07-19 MED ORDER — FOLIC ACID 1 MG PO TABS
1.0000 mg | ORAL_TABLET | Freq: Every day | ORAL | 1 refills | Status: DC
Start: 2020-07-19 — End: 2020-08-16

## 2020-07-19 MED ORDER — DEXAMETHASONE 4 MG PO TABS
ORAL_TABLET | ORAL | 0 refills | Status: DC
Start: 2020-07-19 — End: 2020-08-16

## 2020-07-19 MED ORDER — LIDOCAINE-PRILOCAINE 2.5-2.5 % EX CREA
1.0000 | TOPICAL_CREAM | CUTANEOUS | 0 refills | Status: DC | PRN
Start: 2020-07-19 — End: 2021-02-03

## 2020-07-19 NOTE — Progress Notes (Signed)
START OFF PATHWAY REGIMEN - Non-Small Cell Lung   [id number]:Pembrolizumab 200 mg  IV D1 + Pemetrexed 500 mg/m2 IV D1 + Carboplatin AUC=5 IV D1 q21 Days:   A cycle is every 21 days:     Pembrolizumab      Pemetrexed      Carboplatin   **Always confirm dose/schedule in your pharmacy ordering system**  Patient Characteristics: Stage IV Metastatic, Nonsquamous, Initial Chemotherapy/Immunotherapy, PS = 0, 1, ALK or EGFR or ROS1 or NTRK or MET or RET Genomic Alterations - Did Not Order Test/Quantity Not Sufficient Therapeutic Status: Stage IV Metastatic Histology: Nonsquamous Cell ROS1 Rearrangement Status: Awaiting Test Results Other Mutations/Biomarkers: Awaiting Test Results NTRK Gene Fusion Status: Awaiting Test Results PD-L1 Expression Status: Awaiting Test Results Chemotherapy/Immunotherapy LOT: Initial Chemotherapy/Immunotherapy Molecular Targeted Therapy: Not Appropriate MET Exon 14 Mutation Status: Awaiting Test Results RET Gene Fusion Status: Awaiting Test Results ALK Rearrangement Status: Awaiting Test Results EGFR Mutation Status: Awaiting Test Results BRAF [id number] Mutation Status: Awaiting Test Results ECOG Performance Status: 0 Biomarker Assessment Status Confirmation: Did not order genomic biomarker test(s) / Quantity not sufficient for analysis Intent of Therapy: Non-Curative / Palliative Intent, Discussed with Patient

## 2020-07-19 NOTE — Progress Notes (Signed)
[facility] CONSULT NOTE  Patient Care Team: Ezequiel Kayser, MD as PCP - General (Internal Medicine) Telford Nab, RN as Oncology Nurse Navigator  CHIEF COMPLAINTS/PURPOSE OF CONSULTATION: lung nodule/mass  #  Oncology History Overview Note  # July 2021- RLL- 9.4 cm x 7.5 cm x 6.7 cm lobulated low-attenuation heterogeneous lung mass within the posteromedial aspect of the right lower lobe, at the level of the right hilum; positive for hilar; mediastinal adenopathy; 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 ([place])  07/05/2020 Initial Diagnosis   Cancer of lower lobe of right lung ([place])   07/19/2020 -  Chemotherapy   The patient had dexamethasone (DECADRON) 4 MG tablet, 0 of 1 cycle, Start date: --, End date: -- PALONOSETRON HCL INJECTION 0.25 MG/5ML, 0.25 mg, Intravenous,  Once, 0 of 4 cycles PEMEtrexed (ALIMTA) 1,100 mg in sodium chloride 0.9 % 100 mL chemo infusion, 500 mg/m2, Intravenous,  Once, 0 of 6 cycles CARBOplatin (PARAPLATIN) in sodium chloride 0.9 % 100 mL chemo infusion, , Intravenous,  Once, 0 of 4 cycles FOSAPREPITANT IV INFUSION 150 MG, 150 mg, Intravenous,  Once, 0 of 4 cycles pembrolizumab (KEYTRUDA) 200 mg in sodium chloride 0.9 % 50 mL chemo infusion, 200 mg, Intravenous, Once, 0 of 6 cycles  for chemotherapy treatment.       HISTORY OF PRESENTING ILLNESS:  Ralph Williams 79 y.o.  male suspected diagnosis of lung cancer is here to review the results of the PET scan/CT-guided lung biopsy.  Patient denies any worsening shortness of breath or cough.  Appetite is fair.  Denies any falls.  Review of  Systems  Constitutional: Negative for chills, diaphoresis, fever, malaise/fatigue and weight loss.  HENT: Negative for nosebleeds and sore throat.   Eyes: Negative for double vision.  Respiratory: Positive for cough and wheezing. Negative for hemoptysis, sputum production and shortness of breath.   Cardiovascular: Negative for chest pain, palpitations, orthopnea and leg swelling.  Gastrointestinal: Negative for abdominal pain, blood in stool, constipation, diarrhea, heartburn, melena, nausea and vomiting.  Genitourinary: Negative for dysuria, frequency and urgency.  Musculoskeletal: Negative for back pain and joint pain.  Skin: Negative.  Negative for itching and rash.  Neurological: Negative for dizziness, tingling, focal weakness, weakness and headaches.  Endo/Heme/Allergies: Does not bruise/bleed easily.  Psychiatric/Behavioral: Negative for depression. The patient is not nervous/anxious and does not have insomnia.      MEDICAL HISTORY:  Past Medical History:  Diagnosis Date  . Chronic renal insufficiency   . Diabetes mellitus without complication ([place])    Pt states it's diet controlled.  Marland Kitchen GERD (gastroesophageal reflux disease)   . Hyperlipidemia associated with type 2 diabetes mellitus ([place])   . Hypertension   . Lung mass     SURGICAL HISTORY: Past Surgical History:  Procedure Laterality Date  . lymphoma removal     back    SOCIAL HISTORY: Social History   Socioeconomic History  . Marital status: Married    Spouse name: Not on file  . Number of children: Not on file  . Years of education: Not on file  . Highest  education level: Not on file  Occupational History  . Not on file  Tobacco Use  . Smoking status: Not on file  Substance and Sexual Activity  . Alcohol use: Not on file  . Drug use: Not on file  . Sexual activity: Not on file  Other Topics Concern  . Not on file  Social History Narrative   Moved from [place]; lives in [place] in 2021; daughter  lives next door. Quit smoking- 13 years ago. Social alcohol. 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    Social Determinants of Health   Financial Resource Strain:   . Difficulty of Paying Living Expenses:   Food Insecurity:   . Worried About Charity fundraiser in the Last Year:   . Arboriculturist in the Last Year:   Transportation Needs:   . Film/video editor (Medical):   Marland Kitchen Lack of Transportation (Non-Medical):   Physical Activity:   . Days of Exercise per Week:   . Minutes of Exercise per Session:   Stress:   . Feeling of Stress :   Social Connections:   . Frequency of Communication with Friends and Family:   . Frequency of Social Gatherings with Friends and Family:   . Attends Religious Services:   . Active Member of Clubs or Organizations:   . Attends Archivist Meetings:   Marland Kitchen Marital Status:   Intimate Partner Violence:   . Fear of Current or Ex-Partner:   . Emotionally Abused:   Marland Kitchen Physically Abused:   . Sexually Abused:     FAMILY HISTORY: Family History  Problem Relation Age of Onset  . Liver cancer Sister   . Cancer - Other Brother        bone cancer [2001]    ALLERGIES:  is allergic to niacin and related.  MEDICATIONS:  Current Outpatient Medications  Medication Sig Dispense Refill  . acetaminophen (TYLENOL) 500 MG tablet Take 2 tablets by mouth every 8 (eight) hours.     Marland Kitchen albuterol (VENTOLIN HFA) 108 (90 Base) MCG/ACT inhaler Inhale 2 puffs into the lungs in the morning, at noon, in the evening, and at bedtime.    Marland Kitchen aspirin 325 MG EC tablet Take 1 tablet by mouth daily.    Marland Kitchen atenolol (TENORMIN) 25 MG tablet Take 1 tablet by mouth daily.    Marland Kitchen atorvastatin (LIPITOR) 40 MG tablet Take 1 tablet by mouth daily.    . hydrochlorothiazide (HYDRODIURIL) 12.5 MG tablet Take 12.5 mg by mouth daily.    Marland Kitchen losartan (COZAAR) 50 MG tablet Take 1 tablet by mouth daily.    . Melatonin 10 MG TABS Take 1 tablet by mouth at bedtime.    .  Omega-3 1000 MG CAPS Take 1,000 mg by mouth in the morning and at bedtime.    Marland Kitchen omeprazole (PRILOSEC) 20 MG capsule Take 20 mg by mouth in the morning and at bedtime.    . tadalafil (CIALIS) 20 MG tablet Take 1 tablet by mouth daily.    . Zinc 22.5 MG TABS Take 22 mg by mouth daily.    Marland Kitchen dexamethasone (DECADRON) 4 MG tablet Take one pill AM & PM; TAKE the day before & day after chemo; DO NOT Take on the day of chemo. 60 tablet 0  . folic acid (FOLVITE) 1 MG tablet Take 1 tablet (1 mg total) by mouth daily. 90 tablet 1  . lidocaine-prilocaine (EMLA) cream Apply 1 application topically as needed. 30 g 0  .  ondansetron (ZOFRAN) 8 MG tablet One pill every 8 hours as needed for nausea/vomitting. 40 tablet 1  . prochlorperazine (COMPAZINE) 10 MG tablet Take 1 tablet (10 mg total) by mouth every 6 (six) hours as needed for nausea or vomiting. 40 tablet 1   No current facility-administered medications for this visit.      Marland Kitchen  PHYSICAL EXAMINATION: ECOG PERFORMANCE STATUS: 1 - Symptomatic but completely ambulatory  Vitals:   07/19/20 1454  BP: (!) 124/58  Pulse: 70  Resp: 20  Temp: 97.9 F (36.6 C)   Filed Weights   07/19/20 1454  Weight: (!) 227 lb (103 kg)    Physical Exam Constitutional:      Comments: Ambulating independently.  Accompanied by his wife.  HENT:     Head: Normocephalic and atraumatic.     Mouth/Throat:     Pharynx: No oropharyngeal exudate.  Eyes:     Pupils: Pupils are equal, round, and reactive to light.  Cardiovascular:     Rate and Rhythm: Normal rate and regular rhythm.  Pulmonary:     Effort: No respiratory distress.     Breath sounds: No wheezing.     Comments: Positive for wheezing mostly on the right side. Abdominal:     General: Bowel sounds are normal. There is no distension.     Palpations: Abdomen is soft. There is no mass.     Tenderness: There is no abdominal tenderness. There is no guarding or rebound.  Musculoskeletal:        General: No  tenderness. Normal range of motion.     Cervical back: Normal range of motion and neck supple.  Skin:    General: Skin is warm.  Neurological:     Mental Status: He is alert and oriented to person, place, and time.  Psychiatric:        Mood and Affect: Affect normal.      LABORATORY DATA:  I have reviewed the data as listed Lab Results  Component Value Date   WBC 10.3 07/16/2020   HGB 13.8 07/16/2020   HCT 41.6 07/16/2020   MCV 87.4 07/16/2020   PLT 221 07/16/2020   Recent Labs    07/05/20 1002  NA 136  K 4.0  CL 103  CO2 23  GLUCOSE 136*  BUN 26*  CREATININE 1.16  CALCIUM 8.7*  GFRNONAA >60  GFRAA >60  PROT 7.4  ALBUMIN 3.5  AST 18  ALT 34  ALKPHOS 67  BILITOT 0.6    RADIOGRAPHIC STUDIES: I have personally reviewed the radiological images as listed and agreed with the findings in the report. CT CHEST W CONTRAST  Result Date: 06/28/2020 CLINICAL DATA:  Wheezing with congestion x6 months. EXAM: CT CHEST WITH CONTRAST TECHNIQUE: Multidetector CT imaging of the chest was performed during intravenous contrast administration. CONTRAST:  75mL OMNIPAQUE IOHEXOL 300 MG/ML  SOLN COMPARISON:  None. FINDINGS: Cardiovascular: No significant vascular findings. Normal heart size. No pericardial effusion. Mediastinum/Nodes: There is mild right hilar and mediastinal lymphadenopathy. Thyroid gland, trachea, and esophagus demonstrate no significant findings. Lungs/Pleura: A 9.4 cm x 7.5 cm x 6.7 cm lobulated low-attenuation heterogeneous lung mass is seen within the posteromedial aspect of the right lower lobe, at the level of the right hilum. There is no evidence of a pleural effusion or pneumothorax. Upper Abdomen: Multiple simple cysts are seen within both kidneys. Musculoskeletal: No chest wall abnormality. No acute or significant osseous findings. IMPRESSION: 1. 9.4 cm x 7.5 cm x 6.7 cm lobulated  low-attenuation heterogeneous lung mass within the posteromedial aspect of the right  lower lobe, at the level of the right hilum. This is concerning for a primary lung malignancy. 2. Mild right hilar and mediastinal lymphadenopathy. 3. Multiple simple cysts within both kidneys. Electronically Signed   By: Virgina Norfolk M.D.   On: 06/28/2020 20:37   MR Brain W Wo Contrast  Result Date: 07/21/2020 CLINICAL DATA:  Malignant neoplasm of unspecified part of unspecified bronchus or lung. Non-small cell lung cancer, staging. EXAM: MRI HEAD WITHOUT AND WITH CONTRAST TECHNIQUE: Multiplanar, multiecho pulse sequences of the brain and surrounding structures were obtained without and with intravenous contrast. CONTRAST:  10 mL Gadavist intravenous contrast. COMPARISON:  No prior imaging of the head is available for comparison., prior PET-CT 07/11/2020. FINDINGS: Brain: Mild generalized parenchymal atrophy. Mild scattered T2/FLAIR hyperintensity within the cerebral white matter is nonspecific, but consistent with chronic small vessel ischemic disease. No abnormal enhance is demonstrated to suggest intracranial metastatic disease. There is no acute infarct. No evidence of intracranial mass. No chronic intracranial blood products. No extra-axial fluid collection. No midline shift. Vascular: Expected proximal arterial flow voids. Skull and upper cervical spine: Indeterminate 4 mm T1 hypointense lesion within the dens (series 9, image 13). No focal calvarial marrow lesion is identified. Sinuses/Orbits: Visualized orbits show no acute finding. Mild ethmoid sinus mucosal thickening. Bilateral maxillary sinus mucous retention cysts. No significant mastoid effusion. IMPRESSION: No evidence of intracranial metastatic disease. Indeterminate 4 mm well-circumscribed lesion within the dens. Attention recommended on follow-up. Mild generalized parenchymal atrophy and chronic small vessel ischemic changes. Mild ethmoid sinus mucosal thickening. Bilateral maxillary sinus mucous retention cysts. Electronically Signed    By: Kellie Simmering DO   On: 07/21/2020 17:18   NM PET Image Initial (PI) Skull Base To Thigh  Result Date: 07/11/2020 CLINICAL DATA:  Initial treatment strategy for chest CT demonstrating right lower lobe lung mass, thoracic adenopathy. EXAM: NUCLEAR MEDICINE PET SKULL BASE TO THIGH TECHNIQUE: 12.6 mCi F-18 FDG was injected intravenously. Full-ring PET imaging was performed from the skull base to thigh after the radiotracer. CT data was obtained and used for attenuation correction and anatomic localization. Fasting blood glucose: 103 mg/dl COMPARISON:  Chest CT 06/28/2020 FINDINGS: Mediastinal blood pool activity: SUV max 3.0 Liver activity: SUV max NA NECK: No areas of abnormal hypermetabolism. Incidental CT findings: Right maxillary sinus mucous retention cyst or polyp. No cervical adenopathy. Left carotid atherosclerosis. CHEST: Bilateral mediastinal hypermetabolic adenopathy. Example AP window node at 1.2 cm and a S.U.V. max of 11.4 on 79/3. Subcarinal nodal mass measures 2.6 cm and a S.U.V. max of 13.7 on 93/3. Similar to on the prior exam. The central right lower lobe lung mass with direct mediastinal invasion measures 8.8 x 6.7 cm and a S.U.V. max of 18.8 on 102/3. Incidental CT findings: Aortic and coronary artery atherosclerosis. ABDOMEN/PELVIS: Left adrenal nodule measures 9 mm and a S.U.V. max of 7.1 on 143/3. This is similar in size to on the prior diagnostic CT. No abdominopelvic nodal hypermetabolism. Low anal hypermetabolism is without CT correlate and favored to be physiologic. Incidental CT findings: Aortic atherosclerosis. An upper pole right renal 1.4 cm lesion measures 30 HU on 148/3, up to 76 HU on the prior, postcontrast CT. An upper pole left renal lesion measures 25 HU on 148/3, up to 53 HU on the prior diagnostic CT. 1.4 cm. SKELETON: No abnormal marrow activity. Incidental CT findings: No acute osseous abnormality. IMPRESSION: 1. Right lower lobe primary bronchogenic  carcinoma with  bilateral mediastinal nodal and left adrenal metastasis. 2. Bilateral indeterminate renal lesions. Suspicious for solid renal cell carcinoma or carcinomas, given comparison to prior contrast enhanced chest CT. These could either be re-evaluated at follow-up or more entirely characterized with pre and post contrast renal protocol CT or MRI. 3. Incidental findings, including: Coronary artery atherosclerosis. Aortic Atherosclerosis (ICD10-I70.0). Sinus disease. Electronically Signed   By: Abigail Miyamoto M.D.   On: 07/11/2020 14:22   CT Biopsy  Result Date: 07/16/2020 INDICATION: Concern for bronchogenic carcinoma. Please perform CT-guided biopsy for tissue diagnostic purposes. EXAM: CT-GUIDED BIOPSY OF HYPERMETABOLIC RIGHT LOWER LOBE PULMONARY MASS COMPARISON:  PET-CT-07/11/2020; chest CT-06/28/2020 MEDICATIONS: None. ANESTHESIA/SEDATION: Fentanyl 50 mcg IV; Versed 1 mg IV Sedation time: 15 minutes; The patient was continuously monitored during the procedure by the interventional radiology nurse under my direct supervision. CONTRAST:  None COMPLICATIONS: None immediate. PROCEDURE: Informed consent was obtained from the patient following an explanation of the procedure, risks, benefits and alternatives. The patient understands,agrees and consents for the procedure. All questions were addressed. A time out was performed prior to the initiation of the procedure. The patient was positioned prone on the CT table and a limited chest CT was performed for procedural planning demonstrating unchanged size and appearance the at least 8.0 x 6.1 cm right lower lobe pulmonary mass (image 29, series 2). The operative site was prepped and draped in the usual sterile fashion. Under sterile conditions and local anesthesia, a 17 gauge coaxial needle was advanced into the peripheral aspect of the nodule. Positioning was confirmed with intermittent CT fluoroscopy and followed by the acquisition of 3 core needle biopsy samples with an 18  gauge core needle biopsy device. The coaxial needle was removed following deployment of a Biosentry plug and superficial hemostasis was achieved with manual compression. Limited post procedural chest CT was negative for pneumothorax or additional complication. A dressing was placed. The patient tolerated the procedure well without immediate postprocedural complication. The patient was escorted to have an upright chest radiograph. IMPRESSION: Technically successful CT guided core needle core biopsy of hypermetabolic right lower lobe pulmonary mass. Electronically Signed   By: Sandi Mariscal M.D.   On: 07/16/2020 10:33   DG Chest Port 1 View  Result Date: 07/16/2020 CLINICAL DATA:  Post right lower lobe pulmonary mass biopsy. EXAM: PORTABLE CHEST 1 VIEW COMPARISON:  06/11/2020; chest CT-06/28/2020; PET-CT-07/11/2020 FINDINGS: Grossly unchanged cardiac silhouette and mediastinal contours with persistent partial obscuration of the right heart border secondary to known large right lower lobe pulmonary mass. No evidence of complication following recent right lower lobe pulmonary mass biopsy. Specifically, no pneumothorax or pleural effusion. No new focal airspace opacities. No evidence of edema. No acute osseous abnormalities. Degenerative change the bilateral glenohumeral joints is suspected though incompletely evaluated. IMPRESSION: No evidence of complication following right lower lobe pulmonary mass biopsy. Specifically, no pneumothorax. Electronically Signed   By: Sandi Mariscal M.D.   On: 07/16/2020 10:32    ASSESSMENT & PLAN:   Cancer of lower lobe of right lung ([place]) # Lung cancer-non-small cell favor adeno ca; Stage IV RLL mass- ~9 cm; with hilar/mediastinal adenopathy; with left adrenal metastases.   #Reviewed the pathology/stage with the patient and family.  Reviewed treatments are palliative not curative.  # Recommend carboplatin and Alimta chemotherapy+ Keytruda every 3 weeks x4 cycles.  We will plan  restaging scans in between.  Also will plan maintenance therapy with Keytruda-indefinite until intolerance/progression.  Discussed the response rates ~50%. Will get  MRI Brain.   Discussed the potential side effects including but not limited to-increasing fatigue, nausea vomiting, diarrhea, hair loss, sores in the mouth, increase risk of infection and also neuropathy.  Recommend folic YKDX/I33.  Prescription for Zofran/Compazine/EMLA cream.  Discussed port-interested; will make a referral.  Chemotherapy education.  Discussed the potential side effects of immunotherapy including but not limited to diarrhea; skin rash; elevated LFTs/endocrine abnormalities etc.  # DISPOSITION: # chemo education [ carbo-alimta-keytruda] # MRI Brain ASAP # mediport referral ASAP # 1 week-MD; labs- cbc/cmp; carbo-alimta-keytruda- [new]- Dr.B  # I reviewed the blood work- with the patient in detail; also reviewed the imaging independently [as summarized above]; and with the patient in detail.   # 40 minutes face-to-face with the patient discussing the above plan of care; more than 50% of time spent on prognosis/ natural history; counseling and coordination.  All questions were answered. The patient knows to call the clinic with any problems, questions or concerns.    Cammie Sickle, MD 07/21/2020 8:58 PM

## 2020-07-19 NOTE — Assessment & Plan Note (Addendum)
#   Lung cancer-non-small cell favor adeno ca; Stage IV RLL mass- ~9 cm; with hilar/mediastinal adenopathy; with left adrenal metastases.   #Reviewed the pathology/stage with the patient and family.  Reviewed treatments are palliative not curative.  # Recommend carboplatin and Alimta chemotherapy+ Keytruda every 3 weeks x4 cycles.  We will plan restaging scans in between.  Also will plan maintenance therapy with Keytruda-indefinite until intolerance/progression.  Discussed the response rates ~50%. Will get MRI Brain.   Discussed the potential side effects including but not limited to-increasing fatigue, nausea vomiting, diarrhea, hair loss, sores in the mouth, increase risk of infection and also neuropathy.  Recommend folic WOEH/O12.  Prescription for Zofran/Compazine/EMLA cream.  Discussed port-interested; will make a referral.  Chemotherapy education.  Discussed the potential side effects of immunotherapy including but not limited to diarrhea; skin rash; elevated LFTs/endocrine abnormalities etc.  # DISPOSITION: # chemo education [ carbo-alimta-keytruda] # MRI Brain ASAP # mediport referral ASAP # 1 week-MD; labs- cbc/cmp; carbo-alimta-keytruda- [new]- Dr.B  # I reviewed the blood work- with the patient in detail; also reviewed the imaging independently [as summarized above]; and with the patient in detail.   # 40 minutes face-to-face with the patient discussing the above plan of care; more than 50% of time spent on prognosis/ natural history; counseling and coordination.

## 2020-07-21 ENCOUNTER — Ambulatory Visit
Admission: RE | Admit: 2020-07-21 | Discharge: 2020-07-21 | Disposition: A | Discharge status: Home / Self Care | Payer: Medicare PPO | Source: Ambulatory Visit | Attending: Internal Medicine | Admitting: Internal Medicine

## 2020-07-21 DIAGNOSIS — C3490 Malignant neoplasm of unspecified part of unspecified bronchus or lung: Secondary | ICD-10-CM | POA: Insufficient documentation

## 2020-07-21 MED ORDER — GADOBUTROL 1 MMOL/ML IV SOLN
10.0000 mL | Freq: Once | INTRAVENOUS | Status: AC | PRN
  Administered 2020-07-21: 10 mL via INTRAVENOUS

## 2020-07-22 ENCOUNTER — Other Ambulatory Visit: Payer: Self-pay

## 2020-07-22 ENCOUNTER — Ambulatory Visit
Admission: RE | Admit: 2020-07-22 | Discharge: 2020-07-22 | Disposition: A | Discharge status: Home / Self Care | Payer: Medicare PPO | Source: Ambulatory Visit | Attending: Internal Medicine | Admitting: Internal Medicine

## 2020-07-22 DIAGNOSIS — Z7982 Long term (current) use of aspirin: Secondary | ICD-10-CM | POA: Insufficient documentation

## 2020-07-22 DIAGNOSIS — E785 Hyperlipidemia, unspecified: Secondary | ICD-10-CM | POA: Diagnosis not present

## 2020-07-22 DIAGNOSIS — I129 Hypertensive chronic kidney disease with stage 1 through stage 4 chronic kidney disease, or unspecified chronic kidney disease: Secondary | ICD-10-CM | POA: Diagnosis not present

## 2020-07-22 DIAGNOSIS — Z79899 Other long term (current) drug therapy: Secondary | ICD-10-CM | POA: Insufficient documentation

## 2020-07-22 DIAGNOSIS — N189 Chronic kidney disease, unspecified: Secondary | ICD-10-CM | POA: Insufficient documentation

## 2020-07-22 DIAGNOSIS — Z87891 Personal history of nicotine dependence: Secondary | ICD-10-CM | POA: Diagnosis not present

## 2020-07-22 DIAGNOSIS — C3431 Malignant neoplasm of lower lobe, right bronchus or lung: Secondary | ICD-10-CM | POA: Diagnosis present

## 2020-07-22 DIAGNOSIS — E1122 Type 2 diabetes mellitus with diabetic chronic kidney disease: Secondary | ICD-10-CM | POA: Insufficient documentation

## 2020-07-22 HISTORY — PX: IR IMAGING GUIDED PORT INSERTION: IMG5740

## 2020-07-22 MED ORDER — CEFAZOLIN SODIUM-DEXTROSE 2-4 GM/100ML-% IV SOLN
INTRAVENOUS | Status: AC
  Filled 2020-07-22: qty 100

## 2020-07-22 MED ORDER — MIDAZOLAM HCL 2 MG/2ML IJ SOLN
INTRAMUSCULAR | Status: AC
  Filled 2020-07-22: qty 2

## 2020-07-22 MED ORDER — FENTANYL CITRATE (PF) 100 MCG/2ML IJ SOLN
INTRAMUSCULAR | Status: AC
  Filled 2020-07-22: qty 2

## 2020-07-22 MED ORDER — FENTANYL CITRATE (PF) 100 MCG/2ML IJ SOLN
INTRAMUSCULAR | Status: AC | PRN
  Administered 2020-07-22: 50 ug via INTRAVENOUS

## 2020-07-22 MED ORDER — MIDAZOLAM HCL 2 MG/2ML IJ SOLN
INTRAMUSCULAR | Status: AC | PRN
  Administered 2020-07-22: 1 mg via INTRAVENOUS

## 2020-07-22 MED ORDER — CEFAZOLIN SODIUM-DEXTROSE 2-4 GM/100ML-% IV SOLN: 2.0000 g | INTRAVENOUS | Status: AC

## 2020-07-22 MED ORDER — SODIUM CHLORIDE 0.9 % IV SOLN
INTRAVENOUS | Status: DC
  Administered 2020-07-22: 1000 mL via INTRAVENOUS

## 2020-07-22 NOTE — Progress Notes (Signed)
Patient ID: Ralph Williams, male   DOB: 11-12-1941, 79 y.o.   MRN: [id number]   Patient is well-known to the interventional radiology service after undergoing CT-guided lung mass biopsy on 07/16/2020.  He returns today for image guided placement of portacatheter.  Patient is again without complaint.  Please refer to dedicated H&P performed 07/16/2020 for additional details.  Risks and benefits of image guided port-a-catheter placement was discussed with the patient including, but not limited to bleeding, infection, pneumothorax, or fibrin sheath development and need for additional procedures.  All of the patient's questions were answered, patient is agreeable to proceed. Consent signed and in chart.  Ronny Bacon, MD Pager #: [phone]

## 2020-07-22 NOTE — Procedures (Signed)
Pre Procedure Dx: Poor venous access Post Procedural Dx: Same  Successful placement of right IJ approach port-a-cath with tip at the superior caval atrial junction. The catheter is ready for immediate use.  Estimated Blood Loss: Minimal  Complications: None immediate.  Jay Mackensi Mahadeo, MD Pager #: [phone]   

## 2020-07-22 NOTE — Patient Instructions (Signed)
Pembrolizumab injection What is this medicine? PEMBROLIZUMAB (pem broe liz ue mab) is a monoclonal antibody. It is used to treat certain types of cancer. This medicine may be used for other purposes; ask your health care provider or pharmacist if you have questions. COMMON BRAND NAME(S): Keytruda What should I tell my health care provider before I take this medicine? They need to know if you have any of these conditions:  diabetes  immune system problems  inflammatory bowel disease  liver disease  lung or breathing disease  lupus  received or scheduled to receive an organ transplant or a stem-cell transplant that uses donor stem cells  an unusual or allergic reaction to pembrolizumab, other medicines, foods, dyes, or preservatives  pregnant or trying to get pregnant  breast-feeding How should I use this medicine? This medicine is for infusion into a vein. It is given by a health care professional in a hospital or clinic setting. A special MedGuide will be given to you before each treatment. Be sure to read this information carefully each time. Talk to your pediatrician regarding the use of this medicine in children. While this drug may be prescribed for children as young as 6 months for selected conditions, precautions do apply. Overdosage: If you think you have taken too much of this medicine contact a poison control center or emergency room at once. NOTE: This medicine is only for you. Do not share this medicine with others. What if I miss a dose? It is important not to miss your dose. Call your doctor or health care professional if you are unable to keep an appointment. What may interact with this medicine? Interactions have not been studied. Give your health care provider a list of all the medicines, herbs, non-prescription drugs, or dietary supplements you use. Also tell them if you smoke, drink alcohol, or use illegal drugs. Some items may interact with your medicine. This  list may not describe all possible interactions. Give your health care provider a list of all the medicines, herbs, non-prescription drugs, or dietary supplements you use. Also tell them if you smoke, drink alcohol, or use illegal drugs. Some items may interact with your medicine. What should I watch for while using this medicine? Your condition will be monitored carefully while you are receiving this medicine. You may need blood work done while you are taking this medicine. Do not become pregnant while taking this medicine or for 4 months after stopping it. Women should inform their doctor if they wish to become pregnant or think they might be pregnant. There is a potential for serious side effects to an unborn child. Talk to your health care professional or pharmacist for more information. Do not breast-feed an infant while taking this medicine or for 4 months after the last dose. What side effects may I notice from receiving this medicine? Side effects that you should report to your doctor or health care professional as soon as possible:  allergic reactions like skin rash, itching or hives, swelling of the face, lips, or tongue  bloody or black, tarry  breathing problems  changes in vision  chest pain  chills  confusion  constipation  cough  diarrhea  dizziness or feeling faint or lightheaded  fast or irregular heartbeat  fever  flushing  joint pain  low blood counts - this medicine may decrease the number of white blood cells, red blood cells and platelets. You may be at increased risk for infections and bleeding.  muscle pain  muscle   weakness  pain, tingling, numbness in the hands or feet  persistent headache  redness, blistering, peeling or loosening of the skin, including inside the mouth  signs and symptoms of high blood sugar such as dizziness; dry mouth; dry skin; fruity breath; nausea; stomach pain; increased hunger or thirst; increased urination  signs  and symptoms of kidney injury like trouble passing urine or change in the amount of urine  signs and symptoms of liver injury like dark urine, light-colored stools, loss of appetite, nausea, right upper belly pain, yellowing of the eyes or skin  sweating  swollen lymph nodes  weight loss Side effects that usually do not require medical attention (report to your doctor or health care professional if they continue or are bothersome):  decreased appetite  hair loss  muscle pain  tiredness This list may not describe all possible side effects. Call your doctor for medical advice about side effects. You may report side effects to FDA at 1-800-FDA-1088. Where should I keep my medicine? This drug is given in a hospital or clinic and will not be stored at home. NOTE: This sheet is a summary. It may not cover all possible information. If you have questions about this medicine, talk to your doctor, pharmacist, or health care provider.  2020 Elsevier/Gold Standard (2019-10-13 18:07:58) Pemetrexed injection What is this medicine? PEMETREXED (PEM e TREX ed) is a chemotherapy drug used to treat lung cancers like non-small cell lung cancer and mesothelioma. It may also be used to treat other cancers. This medicine may be used for other purposes; ask your health care provider or pharmacist if you have questions. COMMON BRAND NAME(S): Alimta What should I tell my health care provider before I take this medicine? They need to know if you have any of these conditions:  infection (especially a virus infection such as chickenpox, cold sores, or herpes)  kidney disease  low blood counts, like low white cell, platelet, or red cell counts  lung or breathing disease, like asthma  radiation therapy  an unusual or allergic reaction to pemetrexed, other medicines, foods, dyes, or preservative  pregnant or trying to get pregnant  breast-feeding How should I use this medicine? This drug is given as  an infusion into a vein. It is administered in a hospital or clinic by a specially trained health care professional. Talk to your pediatrician regarding the use of this medicine in children. Special care may be needed. Overdosage: If you think you have taken too much of this medicine contact a poison control center or emergency room at once. NOTE: This medicine is only for you. Do not share this medicine with others. What if I miss a dose? It is important not to miss your dose. Call your doctor or health care professional if you are unable to keep an appointment. What may interact with this medicine? This medicine may interact with the following medications:  Ibuprofen This list may not describe all possible interactions. Give your health care provider a list of all the medicines, herbs, non-prescription drugs, or dietary supplements you use. Also tell them if you smoke, drink alcohol, or use illegal drugs. Some items may interact with your medicine. What should I watch for while using this medicine? Visit your doctor for checks on your progress. This drug may make you feel generally unwell. This is not uncommon, as chemotherapy can affect healthy cells as well as cancer cells. Report any side effects. Continue your course of treatment even though you feel ill unless   your doctor tells you to stop. In some cases, you may be given additional medicines to help with side effects. Follow all directions for their use. Call your doctor or health care professional for advice if you get a fever, chills or sore throat, or other symptoms of a cold or flu. Do not treat yourself. This drug decreases your body's ability to fight infections. Try to avoid being around people who are sick. This medicine may increase your risk to bruise or bleed. Call your doctor or health care professional if you notice any unusual bleeding. Be careful brushing and flossing your teeth or using a toothpick because you may get an  infection or bleed more easily. If you have any dental work done, tell your dentist you are receiving this medicine. Avoid taking products that contain aspirin, acetaminophen, ibuprofen, naproxen, or ketoprofen unless instructed by your doctor. These medicines may hide a fever. Call your doctor or health care professional if you get diarrhea or mouth sores. Do not treat yourself. To protect your kidneys, drink water or other fluids as directed while you are taking this medicine. Do not become pregnant while taking this medicine or for 6 months after stopping it. Women should inform their doctor if they wish to become pregnant or think they might be pregnant. Men should not father a child while taking this medicine and for 3 months after stopping it. This may interfere with the ability to father a child. You should talk to your doctor or health care professional if you are concerned about your fertility. There is a potential for serious side effects to an unborn child. Talk to your health care professional or pharmacist for more information. Do not breast-feed an infant while taking this medicine or for 1 week after stopping it. What side effects may I notice from receiving this medicine? Side effects that you should report to your doctor or health care professional as soon as possible:  allergic reactions like skin rash, itching or hives, swelling of the face, lips, or tongue  breathing problems  redness, blistering, peeling or loosening of the skin, including inside the mouth  signs and symptoms of bleeding such as bloody or black, tarry stools; red or dark-brown urine; spitting up blood or brown material that looks like coffee grounds; red spots on the skin; unusual bruising or bleeding from the eye, gums, or nose  signs and symptoms of infection like fever or chills; cough; sore throat; pain or trouble passing urine  signs and symptoms of kidney injury like trouble passing urine or change in the  amount of urine  signs and symptoms of liver injury like dark yellow or brown urine; general ill feeling or flu-like symptoms; light-colored stools; loss of appetite; nausea; right upper belly pain; unusually weak or tired; yellowing of the eyes or skin Side effects that usually do not require medical attention (report to your doctor or health care professional if they continue or are bothersome):  constipation  mouth sores  nausea, vomiting  unusually weak or tired This list may not describe all possible side effects. Call your doctor for medical advice about side effects. You may report side effects to FDA at 1-800-FDA-1088. Where should I keep my medicine? This drug is given in a hospital or clinic and will not be stored at home. NOTE: This sheet is a summary. It may not cover all possible information. If you have questions about this medicine, talk to your doctor, pharmacist, or health care provider.  2020   Elsevier/Gold Standard (2018-01-26 16:11:33) Carboplatin injection What is this medicine? CARBOPLATIN (KAR boe pla tin) is a chemotherapy drug. It targets fast dividing cells, like cancer cells, and causes these cells to die. This medicine is used to treat ovarian cancer and many other cancers. This medicine may be used for other purposes; ask your health care provider or pharmacist if you have questions. COMMON BRAND NAME(S): Paraplatin What should I tell my health care provider before I take this medicine? They need to know if you have any of these conditions:  blood disorders  hearing problems  kidney disease  recent or ongoing radiation therapy  an unusual or allergic reaction to carboplatin, cisplatin, other chemotherapy, other medicines, foods, dyes, or preservatives  pregnant or trying to get pregnant  breast-feeding How should I use this medicine? This drug is usually given as an infusion into a vein. It is administered in a hospital or clinic by a specially  trained health care professional. Talk to your pediatrician regarding the use of this medicine in children. Special care may be needed. Overdosage: If you think you have taken too much of this medicine contact a poison control center or emergency room at once. NOTE: This medicine is only for you. Do not share this medicine with others. What if I miss a dose? It is important not to miss a dose. Call your doctor or health care professional if you are unable to keep an appointment. What may interact with this medicine?  medicines for seizures  medicines to increase blood counts like filgrastim, pegfilgrastim, sargramostim  some antibiotics like amikacin, gentamicin, neomycin, streptomycin, tobramycin  vaccines Talk to your doctor or health care professional before taking any of these medicines:  acetaminophen  aspirin  ibuprofen  ketoprofen  naproxen This list may not describe all possible interactions. Give your health care provider a list of all the medicines, herbs, non-prescription drugs, or dietary supplements you use. Also tell them if you smoke, drink alcohol, or use illegal drugs. Some items may interact with your medicine. What should I watch for while using this medicine? Your condition will be monitored carefully while you are receiving this medicine. You will need important blood work done while you are taking this medicine. This drug may make you feel generally unwell. This is not uncommon, as chemotherapy can affect healthy cells as well as cancer cells. Report any side effects. Continue your course of treatment even though you feel ill unless your doctor tells you to stop. In some cases, you may be given additional medicines to help with side effects. Follow all directions for their use. Call your doctor or health care professional for advice if you get a fever, chills or sore throat, or other symptoms of a cold or flu. Do not treat yourself. This drug decreases your body's  ability to fight infections. Try to avoid being around people who are sick. This medicine may increase your risk to bruise or bleed. Call your doctor or health care professional if you notice any unusual bleeding. Be careful brushing and flossing your teeth or using a toothpick because you may get an infection or bleed more easily. If you have any dental work done, tell your dentist you are receiving this medicine. Avoid taking products that contain aspirin, acetaminophen, ibuprofen, naproxen, or ketoprofen unless instructed by your doctor. These medicines may hide a fever. Do not become pregnant while taking this medicine. Women should inform their doctor if they wish to become pregnant or think they might   be pregnant. There is a potential for serious side effects to an unborn child. Talk to your health care professional or pharmacist for more information. Do not breast-feed an infant while taking this medicine. What side effects may I notice from receiving this medicine? Side effects that you should report to your doctor or health care professional as soon as possible:  allergic reactions like skin rash, itching or hives, swelling of the face, lips, or tongue  signs of infection - fever or chills, cough, sore throat, pain or difficulty passing urine  signs of decreased platelets or bleeding - bruising, pinpoint red spots on the skin, black, tarry stools, nosebleeds  signs of decreased red blood cells - unusually weak or tired, fainting spells, lightheadedness  breathing problems  changes in hearing  changes in vision  chest pain  high blood pressure  low blood counts - This drug may decrease the number of white blood cells, red blood cells and platelets. You may be at increased risk for infections and bleeding.  nausea and vomiting  pain, swelling, redness or irritation at the injection site  pain, tingling, numbness in the hands or feet  problems with balance, talking,  walking  trouble passing urine or change in the amount of urine Side effects that usually do not require medical attention (report to your doctor or health care professional if they continue or are bothersome):  hair loss  loss of appetite  metallic taste in the mouth or changes in taste This list may not describe all possible side effects. Call your doctor for medical advice about side effects. You may report side effects to FDA at 1-800-FDA-1088. Where should I keep my medicine? This drug is given in a hospital or clinic and will not be stored at home. NOTE: This sheet is a summary. It may not cover all possible information. If you have questions about this medicine, talk to your doctor, pharmacist, or health care provider.  2020 Elsevier/Gold Standard (2008-03-13 14:38:05)  

## 2020-07-22 NOTE — Progress Notes (Signed)
  Oncology Nurse Navigator Documentation  Navigator Location: [facility] (07/19/20 1600)   )Navigator Encounter Type: Follow-up Appt (07/19/20 1600)     Confirmed Diagnosis Date: 07/18/20 (07/19/20 1600)               Patient Visit Type: MedOnc (07/19/20 1600) Treatment Phase: Pre-Tx/Tx Discussion (07/19/20 1600) Barriers/Navigation Needs: Coordination of Care;Education (07/19/20 1600) Education: Newly Diagnosed Cancer Education;Understanding Cancer/ Treatment Options (07/19/20 1600) Interventions: Coordination of Care;Education (07/19/20 1600)   Coordination of Care: Appts;Chemo (07/19/20 1600) Education Method: Verbal;Written (07/19/20 1600)         met with patient during follow up visit with Dr. Rogue Bussing to review pathology results and discuss treatment options. All questions answered during visit. Pt given resources regarding supportive services and treatment options. Reviewed upcoming appts. Nothing further needed at this time. Instructed to call with any further questions or needs. Pt verbalized understanding.       Time Spent with Patient: 60 (07/19/20 1600)

## 2020-07-23 ENCOUNTER — Inpatient Hospital Stay (HOSPITAL_BASED_OUTPATIENT_CLINIC_OR_DEPARTMENT_OTHER): Payer: Medicare PPO | Admitting: Oncology

## 2020-07-23 ENCOUNTER — Inpatient Hospital Stay: Payer: Medicare PPO

## 2020-07-23 ENCOUNTER — Inpatient Hospital Stay: Payer: Medicare PPO | Attending: Oncology

## 2020-07-23 DIAGNOSIS — I129 Hypertensive chronic kidney disease with stage 1 through stage 4 chronic kidney disease, or unspecified chronic kidney disease: Secondary | ICD-10-CM | POA: Diagnosis not present

## 2020-07-23 DIAGNOSIS — C7972 Secondary malignant neoplasm of left adrenal gland: Secondary | ICD-10-CM | POA: Insufficient documentation

## 2020-07-23 DIAGNOSIS — E1122 Type 2 diabetes mellitus with diabetic chronic kidney disease: Secondary | ICD-10-CM | POA: Insufficient documentation

## 2020-07-23 DIAGNOSIS — Z5112 Encounter for antineoplastic immunotherapy: Secondary | ICD-10-CM | POA: Insufficient documentation

## 2020-07-23 DIAGNOSIS — Z87891 Personal history of nicotine dependence: Secondary | ICD-10-CM | POA: Diagnosis not present

## 2020-07-23 DIAGNOSIS — E785 Hyperlipidemia, unspecified: Secondary | ICD-10-CM | POA: Insufficient documentation

## 2020-07-23 DIAGNOSIS — N189 Chronic kidney disease, unspecified: Secondary | ICD-10-CM | POA: Insufficient documentation

## 2020-07-23 DIAGNOSIS — C3431 Malignant neoplasm of lower lobe, right bronchus or lung: Secondary | ICD-10-CM | POA: Diagnosis not present

## 2020-07-23 DIAGNOSIS — M489 Spondylopathy, unspecified: Secondary | ICD-10-CM | POA: Diagnosis not present

## 2020-07-23 DIAGNOSIS — Z5111 Encounter for antineoplastic chemotherapy: Secondary | ICD-10-CM | POA: Diagnosis not present

## 2020-07-23 DIAGNOSIS — K219 Gastro-esophageal reflux disease without esophagitis: Secondary | ICD-10-CM | POA: Diagnosis not present

## 2020-07-23 DIAGNOSIS — G4700 Insomnia, unspecified: Secondary | ICD-10-CM | POA: Insufficient documentation

## 2020-07-23 MED ORDER — CYANOCOBALAMIN 1000 MCG/ML IJ SOLN
1000.0000 ug | Freq: Once | INTRAMUSCULAR | Status: AC
  Administered 2020-07-23: 1000 ug via INTRAMUSCULAR
  Filled 2020-07-23: qty 1

## 2020-07-23 NOTE — Progress Notes (Addendum)
[name]  [phone] [phone]  Patient Care Team: Ezequiel Kayser, MD as PCP - General (Internal Medicine) Telford Nab, RN as Oncology Nurse Navigator   Name of the patient: Ralph Williams  [id number]  06-17-1941   Date of visit: 07/23/20  Diagnosis-lung cancer  Chief complaint/Reason for visit- Initial Meeting for [facility], preparing for starting chemotherapy  Heme/Onc history:  Oncology History Overview Note  # July 2021- RLL- 9.4 cm x 7.5 cm x 6.7 cm lobulated low-attenuation heterogeneous lung mass within the posteromedial aspect of the right lower lobe, at the level of the right hilum; positive for hilar; mediastinal adenopathy; 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 ([place])  07/05/2020 Initial Diagnosis   Cancer of lower lobe of right lung ([place])   07/24/2020 -  Chemotherapy   The patient had dexamethasone (DECADRON) 4 MG tablet, 1 of 1 cycle, Start date: --, End date: -- PALONOSETRON HCL INJECTION 0.25 MG/5ML, 0.25 mg, Intravenous,  Once, 0 of 4 cycles PEMEtrexed (ALIMTA) 1,100 mg in sodium chloride 0.9 % 100 mL chemo infusion, 500 mg/m2, Intravenous,  Once, 0 of 6 cycles CARBOplatin (PARAPLATIN) in sodium chloride 0.9 % 100 mL chemo infusion, , Intravenous,  Once, 0 of 4 cycles FOSAPREPITANT IV INFUSION 150 MG, 150 mg, Intravenous,  Once, 0 of 4 cycles pembrolizumab (KEYTRUDA) 200 mg in sodium chloride 0.9 % 50 mL chemo infusion, 200 mg, Intravenous, Once, 0 of 6 cycles  for chemotherapy treatment.      Interval history-  Mr.  Williams is a 79 yo male who presents to chemo care clinic today for initial meeting in preparation for starting chemotherapy. I introduced the chemo care clinic and we discussed that the role of the clinic is to assist those who are at an increased risk of emergency room visits and/or complications during the course of chemotherapy treatment. We discussed that the increased risk takes into account factors such as age, performance status, and co-morbidities. We also discussed that for some, this might include barriers to care such as not having a primary care provider, lack of insurance/transportation, or not being able to afford medications. We discussed that the goal of the program is to help prevent unplanned ER visits and help reduce complications during chemotherapy. We do this by discussing specific risk factors to each individual and identifying ways that we can help improve these risk factors and reduce barriers to care.   ECOG FS:1 - Symptomatic but completely ambulatory  Review of systems- Review of Systems  Constitutional: Negative.  Negative for chills, fever, malaise/fatigue and weight loss.  HENT: Negative for congestion, ear pain and tinnitus.   Eyes: Negative.  Negative for blurred vision and double vision.  Respiratory: Positive for cough and wheezing. Negative for sputum production and shortness of breath.   Cardiovascular: Negative.  Negative for chest pain, palpitations and leg swelling.  Gastrointestinal: Negative.  Negative for abdominal pain, constipation, diarrhea, nausea and vomiting.  Genitourinary: Negative for dysuria, frequency and urgency.  Musculoskeletal: Negative for back pain and falls.  Skin:  Negative.  Negative for rash.  Neurological: Negative.  Negative for weakness and headaches.  Endo/Heme/Allergies: Negative.  Does not bruise/bleed easily.  Psychiatric/Behavioral: Negative.  Negative for depression. The patient is not nervous/anxious and does not have insomnia.       Current treatment-carbo/Alimta plus Keytruda  Allergies  Allergen Reactions   Niacin And Related Rash    Past Medical History:  Diagnosis Date   Chronic renal insufficiency    Diabetes mellitus without complication ([place])    Pt states it's diet controlled.   GERD (gastroesophageal reflux disease)    Hyperlipidemia associated with type 2 diabetes mellitus ([facility])    Hypertension    Lung mass     Past Surgical History:  Procedure Laterality Date   IR IMAGING GUIDED PORT INSERTION  07/22/2020   lymphoma removal     back    Social History   Socioeconomic History   Marital status: Married    Spouse name: Not on file   Number of children: Not on file   Years of education: Not on file   Highest education level: Not on file  Occupational History   Not on file  Tobacco Use   Smoking status: Former Smoker    Quit date: 07/23/2007    Years since quitting: 13.0   Smokeless tobacco: Never Used  Vaping Use   Vaping Use: Never used  Substance and Sexual Activity   Alcohol use: Not on file    Comment: rarely   Drug use: Never   Sexual activity: Not on file  Other Topics Concern   Not on file  Social History Narrative   Moved from [place]; lives in [place] in 2021; daughter lives next door. Quit smoking- 13 years ago. Social alcohol. 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    Social Determinants of Health   Financial Resource Strain:    Difficulty of Paying Living Expenses:   Food Insecurity:    Worried About Charity fundraiser in the Last Year:    Arboriculturist in the Last Year:   Transportation Needs:    Film/video editor (Medical):    Lack of Transportation (Non-Medical):   Physical Activity:    Days of Exercise per Week:    Minutes of Exercise per Session:   Stress:    Feeling of Stress :   Social Connections:    Frequency of Communication with Friends and Family:    Frequency of Social  Gatherings with Friends and Family:    Attends Religious Services:    Active Member of Clubs or Organizations:    Attends Music therapist:    Marital Status:   Intimate Partner Violence:    Fear of Current or Ex-Partner:    Emotionally Abused:    Physically Abused:    Sexually Abused:     Family History  Problem Relation Age of Onset   Liver cancer Sister    Cancer - Other Brother        bone cancer [2001]     Current Outpatient Medications:    acetaminophen (TYLENOL) 500 MG tablet, Take 2 tablets by mouth every 8 (eight) hours. , Disp: , Rfl:    albuterol (VENTOLIN HFA) 108 (90 Base) MCG/ACT inhaler, Inhale 2 puffs into the lungs in the morning, at noon, in the evening, and at bedtime., Disp: , Rfl:    aspirin 325 MG EC tablet, Take 1 tablet by mouth daily., Disp: , Rfl:    atenolol (TENORMIN) 25  MG tablet, Take 1 tablet by mouth daily., Disp: , Rfl:    atorvastatin (LIPITOR) 40 MG tablet, Take 1 tablet by mouth daily., Disp: , Rfl:    dexamethasone (DECADRON) 4 MG tablet, Take one pill AM & PM; TAKE the day before & day after chemo; DO NOT Take on the day of chemo., Disp: 60 tablet, Rfl: 0   folic acid (FOLVITE) 1 MG tablet, Take 1 tablet (1 mg total) by mouth daily., Disp: 90 tablet, Rfl: 1   hydrochlorothiazide (HYDRODIURIL) 12.5 MG tablet, Take 12.5 mg by mouth daily., Disp: , Rfl:    lidocaine-prilocaine (EMLA) cream, Apply 1 application topically as needed., Disp: 30 g, Rfl: 0   losartan (COZAAR) 50 MG tablet, Take 1 tablet by mouth daily., Disp: , Rfl:    Melatonin 10 MG TABS, Take 1 tablet by mouth at bedtime., Disp: , Rfl:    Omega-3 1000 MG CAPS, Take 1,000 mg by mouth in the morning and at bedtime., Disp: , Rfl:    omeprazole (PRILOSEC) 20 MG capsule, Take 20 mg by mouth in the morning and at bedtime., Disp: , Rfl:    ondansetron (ZOFRAN) 8 MG tablet, One pill every 8 hours as needed for nausea/vomitting., Disp: 40 tablet, Rfl: 1    prochlorperazine (COMPAZINE) 10 MG tablet, Take 1 tablet (10 mg total) by mouth every 6 (six) hours as needed for nausea or vomiting., Disp: 40 tablet, Rfl: 1   tadalafil (CIALIS) 20 MG tablet, Take 1 tablet by mouth daily., Disp: , Rfl:    Zinc 22.5 MG TABS, Take 22 mg by mouth daily., Disp: , Rfl:   Physical exam: There were no vitals filed for this visit. Physical Exam Constitutional:      Appearance: Normal appearance.  HENT:     Head: Normocephalic and atraumatic.  Eyes:     Pupils: Pupils are equal, round, and reactive to light.  Cardiovascular:     Rate and Rhythm: Normal rate and regular rhythm.     Heart sounds: Normal heart sounds. No murmur heard.   Pulmonary:     Effort: Pulmonary effort is normal.     Breath sounds: Normal breath sounds. No wheezing.  Abdominal:     General: Bowel sounds are normal. There is no distension.     Palpations: Abdomen is soft.     Tenderness: There is no abdominal tenderness.  Musculoskeletal:        General: Normal range of motion.     Cervical back: Normal range of motion.  Skin:    General: Skin is warm and dry.     Findings: No rash.  Neurological:     Mental Status: He is alert and oriented to person, place, and time.  Psychiatric:        Judgment: Judgment normal.      CMP Latest Ref Rng & Units 07/05/2020  Glucose 70 - 99 mg/dL 136(H)  BUN 8 - 23 mg/dL 26(H)  Creatinine 0.61 - 1.24 mg/dL 1.16  Sodium 135 - 145 mmol/L 136  Potassium 3.5 - 5.1 mmol/L 4.0  Chloride 98 - 111 mmol/L 103  CO2 22 - 32 mmol/L 23  Calcium 8.9 - 10.3 mg/dL 8.7(L)  Total Protein 6.5 - 8.1 g/dL 7.4  Total Bilirubin 0.3 - 1.2 mg/dL 0.6  Alkaline Phos 38 - 126 U/L 67  AST 15 - 41 U/L 18  ALT 0 - 44 U/L 34   CBC Latest Ref Rng & Units 07/16/2020  WBC 4.0 -  10.5 K/uL 10.3  Hemoglobin 13.0 - 17.0 g/dL 13.8  Hematocrit 39 - 52 % 41.6  Platelets 150 - 400 K/uL 221    No images are attached to the encounter.  CT CHEST W CONTRAST  Result Date:  06/28/2020 CLINICAL DATA:  Wheezing with congestion x6 months. EXAM: CT CHEST WITH CONTRAST TECHNIQUE: Multidetector CT imaging of the chest was performed during intravenous contrast administration. CONTRAST:  76m OMNIPAQUE IOHEXOL 300 MG/ML  SOLN COMPARISON:  None. FINDINGS: Cardiovascular: No significant vascular findings. Normal heart size. No pericardial effusion. Mediastinum/Nodes: There is mild right hilar and mediastinal lymphadenopathy. Thyroid gland, trachea, and esophagus demonstrate no significant findings. Lungs/Pleura: A 9.4 cm x 7.5 cm x 6.7 cm lobulated low-attenuation heterogeneous lung mass is seen within the posteromedial aspect of the right lower lobe, at the level of the right hilum. There is no evidence of a pleural effusion or pneumothorax. Upper Abdomen: Multiple simple cysts are seen within both kidneys. Musculoskeletal: No chest wall abnormality. No acute or significant osseous findings. IMPRESSION: 1. 9.4 cm x 7.5 cm x 6.7 cm lobulated low-attenuation heterogeneous lung mass within the posteromedial aspect of the right lower lobe, at the level of the right hilum. This is concerning for a primary lung malignancy. 2. Mild right hilar and mediastinal lymphadenopathy. 3. Multiple simple cysts within both kidneys. Electronically Signed   By: TVirgina NorfolkM.D.   On: 06/28/2020 20:37   MR Brain W Wo Contrast  Result Date: 07/21/2020 CLINICAL DATA:  Malignant neoplasm of unspecified part of unspecified bronchus or lung. Non-small cell lung cancer, staging. EXAM: MRI HEAD WITHOUT AND WITH CONTRAST TECHNIQUE: Multiplanar, multiecho pulse sequences of the brain and surrounding structures were obtained without and with intravenous contrast. CONTRAST:  10 mL Gadavist intravenous contrast. COMPARISON:  No prior imaging of the head is available for comparison., prior PET-CT 07/11/2020. FINDINGS: Brain: Mild generalized parenchymal atrophy. Mild scattered T2/FLAIR hyperintensity within the cerebral  white matter is nonspecific, but consistent with chronic small vessel ischemic disease. No abnormal enhance is demonstrated to suggest intracranial metastatic disease. There is no acute infarct. No evidence of intracranial mass. No chronic intracranial blood products. No extra-axial fluid collection. No midline shift. Vascular: Expected proximal arterial flow voids. Skull and upper cervical spine: Indeterminate 4 mm T1 hypointense lesion within the dens (series 9, image 13). No focal calvarial marrow lesion is identified. Sinuses/Orbits: Visualized orbits show no acute finding. Mild ethmoid sinus mucosal thickening. Bilateral maxillary sinus mucous retention cysts. No significant mastoid effusion. IMPRESSION: No evidence of intracranial metastatic disease. Indeterminate 4 mm well-circumscribed lesion within the dens. Attention recommended on follow-up. Mild generalized parenchymal atrophy and chronic small vessel ischemic changes. Mild ethmoid sinus mucosal thickening. Bilateral maxillary sinus mucous retention cysts. Electronically Signed   By: KKellie SimmeringDO   On: 07/21/2020 17:18   NM PET Image Initial (PI) Skull Base To Thigh  Result Date: 07/11/2020 CLINICAL DATA:  Initial treatment strategy for chest CT demonstrating right lower lobe lung mass, thoracic adenopathy. EXAM: NUCLEAR MEDICINE PET SKULL BASE TO THIGH TECHNIQUE: 12.6 mCi F-18 FDG was injected intravenously. Full-ring PET imaging was performed from the skull base to thigh after the radiotracer. CT data was obtained and used for attenuation correction and anatomic localization. Fasting blood glucose: 103 mg/dl COMPARISON:  Chest CT 06/28/2020 FINDINGS: Mediastinal blood pool activity: SUV max 3.0 Liver activity: SUV max NA NECK: No areas of abnormal hypermetabolism. Incidental CT findings: Right maxillary sinus mucous retention cyst or polyp. No cervical  adenopathy. Left carotid atherosclerosis. CHEST: Bilateral mediastinal hypermetabolic  adenopathy. Example AP window node at 1.2 cm and a S.U.V. max of 11.4 on 79/3. Subcarinal nodal mass measures 2.6 cm and a S.U.V. max of 13.7 on 93/3. Similar to on the prior exam. The central right lower lobe lung mass with direct mediastinal invasion measures 8.8 x 6.7 cm and a S.U.V. max of 18.8 on 102/3. Incidental CT findings: Aortic and coronary artery atherosclerosis. ABDOMEN/PELVIS: Left adrenal nodule measures 9 mm and a S.U.V. max of 7.1 on 143/3. This is similar in size to on the prior diagnostic CT. No abdominopelvic nodal hypermetabolism. Low anal hypermetabolism is without CT correlate and favored to be physiologic. Incidental CT findings: Aortic atherosclerosis. An upper pole right renal 1.4 cm lesion measures 30 HU on 148/3, up to 76 HU on the prior, postcontrast CT. An upper pole left renal lesion measures 25 HU on 148/3, up to 53 HU on the prior diagnostic CT. 1.4 cm. SKELETON: No abnormal marrow activity. Incidental CT findings: No acute osseous abnormality. IMPRESSION: 1. 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 2. Bilateral indeterminate renal lesions. Suspicious for solid renal cell carcinoma or carcinomas, given comparison to prior contrast enhanced chest CT. These could either be re-evaluated at follow-up or more entirely characterized with pre and post contrast renal protocol CT or MRI. 3. Incidental findings, including: Coronary artery atherosclerosis. Aortic Atherosclerosis (ICD10-I70.0). Sinus disease. Electronically Signed   By: Abigail Miyamoto M.D.   On: 07/11/2020 14:22   CT Biopsy  Result Date: 07/16/2020 INDICATION: Concern for bronchogenic carcinoma. Please perform CT-guided biopsy for tissue diagnostic purposes. EXAM: CT-GUIDED BIOPSY OF HYPERMETABOLIC RIGHT LOWER LOBE PULMONARY MASS COMPARISON:  PET-CT-07/11/2020; chest CT-06/28/2020 MEDICATIONS: None. ANESTHESIA/SEDATION: Fentanyl 50 mcg IV; Versed 1 mg IV  Sedation time: 15 minutes; The patient was continuously monitored during the procedure by the interventional radiology nurse under my direct supervision. CONTRAST:  None COMPLICATIONS: None immediate. PROCEDURE: Informed consent was obtained from the patient following an explanation of the procedure, risks, benefits and alternatives. The patient understands,agrees and consents for the procedure. All questions were addressed. A time out was performed prior to the initiation of the procedure. The patient was positioned prone on the CT table and a limited chest CT was performed for procedural planning demonstrating unchanged size and appearance the at least 8.0 x 6.1 cm right lower lobe pulmonary mass (image 29, series 2). The operative site was prepped and draped in the usual sterile fashion. Under sterile conditions and local anesthesia, a 17 gauge coaxial needle was advanced into the peripheral aspect of the nodule. Positioning was confirmed with intermittent CT fluoroscopy and followed by the acquisition of 3 core needle biopsy samples with an 18 gauge core needle biopsy device. The coaxial needle was removed following deployment of a Biosentry plug and superficial hemostasis was achieved with manual compression. Limited post procedural chest CT was negative for pneumothorax or additional complication. A dressing was placed. The patient tolerated the procedure well without immediate postprocedural complication. The patient was escorted to have an upright chest radiograph. IMPRESSION: Technically successful CT guided core needle core biopsy of hypermetabolic right lower lobe pulmonary mass. Electronically Signed   By: Sandi Mariscal M.D.   On: 07/16/2020 10:33   DG Chest Port 1 View  Result Date: 07/16/2020 CLINICAL DATA:  Post right lower lobe pulmonary mass biopsy. EXAM: PORTABLE CHEST 1 VIEW COMPARISON:  06/11/2020; chest CT-06/28/2020; PET-CT-07/11/2020 FINDINGS: Grossly unchanged cardiac  silhouette and  mediastinal contours with persistent partial obscuration of the right heart border secondary to known large right lower lobe pulmonary mass. No evidence of complication following recent right lower lobe pulmonary mass biopsy. Specifically, no pneumothorax or pleural effusion. No new focal airspace opacities. No evidence of edema. No acute osseous abnormalities. Degenerative change the bilateral glenohumeral joints is suspected though incompletely evaluated. IMPRESSION: No evidence of complication following right lower lobe pulmonary mass biopsy. Specifically, no pneumothorax. Electronically Signed   By: Sandi Mariscal M.D.   On: 07/16/2020 10:32   IR IMAGING GUIDED PORT INSERTION  Result Date: 07/22/2020 INDICATION: History of metastatic lung cancer. Patient presents today for [facility] a catheter placement for durable intravenous access for chemotherapy administration. EXAM: IMPLANTED PORT A CATH PLACEMENT WITH ULTRASOUND AND FLUOROSCOPIC GUIDANCE COMPARISON:  PET-CT-07/16/2020 MEDICATIONS: Ancef 2 gm IV; The antibiotic was administered within an appropriate time interval prior to skin puncture. ANESTHESIA/SEDATION: Moderate (conscious) sedation was employed during this procedure. A total of Versed 1 mg and Fentanyl 50 mcg was administered intravenously. Moderate Sedation Time: 25 minutes. The patient's level of consciousness and vital signs were monitored continuously by radiology nursing throughout the procedure under my direct supervision. CONTRAST:  None FLUOROSCOPY TIME:  30 seconds (5.5 mGy) COMPLICATIONS: None immediate. PROCEDURE: The procedure, risks, benefits, and alternatives were explained to the patient. Questions regarding the procedure were encouraged and answered. The patient understands and consents to the procedure. The right neck and chest were prepped with chlorhexidine in a sterile fashion, and a sterile drape was applied covering the operative field. Maximum barrier sterile technique with sterile  gowns and gloves were used for the procedure. A timeout was performed prior to the initiation of the procedure. Local anesthesia was provided with 1% lidocaine with epinephrine. After creating a small venotomy incision, a micropuncture kit was utilized to access the internal jugular vein. Real-time ultrasound guidance was utilized for vascular access including the acquisition of a permanent ultrasound image documenting patency of the accessed vessel. The microwire was utilized to measure appropriate catheter length. A subcutaneous port pocket was then created along the upper chest wall utilizing a combination of sharp and blunt dissection. The pocket was irrigated with sterile saline. A single lumen "standard sized" power injectable port was chosen for placement. The 8 Fr catheter was tunneled from the port pocket site to the venotomy incision. The port was placed in the pocket. The external catheter was trimmed to appropriate length. At the venotomy, an 8 Fr peel-away sheath was placed over a guidewire under fluoroscopic guidance. The catheter was then placed through the sheath and the sheath was removed. Final catheter positioning was confirmed and documented with a fluoroscopic spot radiograph. The port was accessed with a Huber needle, aspirated and flushed with heparinized saline. The venotomy site was closed with an interrupted 4-0 Vicryl suture. The port pocket incision was closed with interrupted 2-0 Vicryl suture. Dermabond and Steri-strips were applied to both incisions. Dressings were applied. The patient tolerated the procedure well without immediate post procedural complication. FINDINGS: After catheter placement, the tip lies within the superior cavoatrial junction. The catheter aspirates and flushes normally and is ready for immediate use. IMPRESSION: Successful placement of a right internal jugular approach power injectable Port-A-Cath. The catheter is ready for immediate use. Electronically Signed    By: Sandi Mariscal M.D.   On: 07/22/2020 11:52     Assessment and plan- Patient is a 79 y.o. male who presents to [facility] for initial  meeting in preparation for starting chemotherapy for the treatment of lung cancer.    1. HPI: Mr. Tressa Busman is a 79 year old male with past medical history significant for right lower lung mass (July 2021).  Initially noticed wheezing with a cough over the past few months.  Progressively getting worse which led to a chest x-ray by his PCP revealing large right lower lobe pulmonary mass (06/11/2020) .  Subsequent CT chest (06/28/2020) showed lung mass and right hilar mediastinal lymphadenopathy.  PET scan (07/11/2020) showed metastatic disease to left adrenal gland.  He had a Port-A-Cath placed yesterday (07/22/2020) in preparation to begin treatment with [place] Alimta and Keytruda this week (07/26/2020).  MRI of his brain from 07/21/2020 was negative for brain metastasis.  2. Chemo Care Clinic/High Risk for ER/Hospitalization during chemotherapy- We discussed the role of the chemo care clinic and identified patient specific risk factors. I discusse can d that patient was identified as high risk primarily based on: stage of disease and age.  Patient has past medical history positive for: Past Medical History:  Diagnosis Date   Chronic renal insufficiency    Diabetes mellitus without complication ([place])    Pt states it's diet controlled.   GERD (gastroesophageal reflux disease)    Hyperlipidemia associated with type 2 diabetes mellitus ([place])    Hypertension    Lung mass     Patient has past surgical history positive for: Past Surgical History:  Procedure Laterality Date   IR IMAGING GUIDED PORT INSERTION  07/22/2020   lymphoma removal     back    Based on our high risk symptom management report; this patient has a high risk of ED utilization.  The percentage below indicates how "at risk "  this patient based on the factors in this table within one year.    General Risk Score: 2  Values used to calculate this score:   Points  Metrics      1        Age: [facility] Admissions: 0      0        ED Visits: 0      0        Has Chronic Obstructive Pulmonary Disease: No      1        Has Diabetes: Yes      0        Has Congestive Heart Failure: No      0        Has liver disease: No      0        Has Depression: No      0        Current PCP: Ezequiel Kayser, MD      0        Has Medicaid: No  3. We discussed that social determinants of health may have significant impacts on health and outcomes for cancer patients.  Today we discussed specific social determinants of performance status, alcohol use, depression, financial needs, food insecurity, housing, interpersonal violence, social connections, stress, tobacco use, and transportation.    After lengthy discussion the following were identified as areas of need: None at this time.   Outpatient services: We discussed options including home based and outpatient services, DME and care program. We discusssed that patients who participate in regular physical activity report fewer negative impacts of cancer and treatments and report less fatigue.  Financial Concerns: We discussed that living with cancer can create tremendous financial burden.  We discussed options for assistance. I asked that if assistance is needed in affording medications or paying bills to please let us know so that we can provide assistance. We discussed options for food including social services, [employer] ($50 every 2 weeks) and onsite food pantry.  We will also notify [facility] to see if cancer center can provide additional support.  Referral to Social work: Introduced Education officer, museum Elease Etienne and the services he can provide such as support with [employer], cell phone and gas vouchers.   Support groups: We discussed options for support groups at the cancer center. If interested, please notify nurse  navigator to enroll. We discussed options for managing stress including healthy eating, exercise as well as participating in no charge counseling services at the cancer center and support groups.  If these are of interest, patient can notify either myself or primary nursing team.We discussed options for management including medications and referral to quit Smart program  Transportation: We discussed options for transportation including acta, paratransit, bus routes, link transit, taxi/uber/lyft, and cancer center [place].  I have notified primary oncology team who will help assist with arranging Lucianne Lei transportation for appointments when/if needed. We also discussed options for transportation on short notice/acute visits.  Palliative care services: We have palliative care services available in the cancer center to discuss goals of care and advanced care planning.  Please let us know if you have any questions or would like to speak to our palliative nurse practitioner.  Symptom Management Clinic: We discussed our symptom management clinic which is available for acute concerns while receiving treatment such as nausea, vomiting or diarrhea.  We can be reached via telephone at [phone] or through my chart.  We are available for virtual or in person visits on the same day from 830 to 4 PM Monday through Friday. He denies needing specific assistance at this time and He will be followed by Dr. Rogue Bussing clinical team.  Plan: Discussed symptom management clinic. Discussed palliative care services. Discussed resources that are available here at the cancer center. Discussed medications and new prescriptions to begin treatment such as anti-nausea or steroids.   Disposition: RTC on 07/26/2020 for labs, MD assessment and cycle 1 carbo/Alimta plus Keytruda. Referral placed for palliative care.   Visit Diagnosis 1. 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 He also understands that He can call clinic at any time with any questions, concerns, or complaints.   Greater than 50% was spent in counseling and coordination of care with this patient including but not limited to discussion of the relevant topics above (See A&P) including, but not limited to diagnosis and management of acute and chronic medical conditions.    [place] [facility] [place]  CC: Dr. Rogue Bussing

## 2020-07-24 NOTE — Addendum Note (Signed)
Addended by: Faythe Casa E on: 07/24/2020 11:15 AM   Modules accepted: Orders

## 2020-07-26 ENCOUNTER — Inpatient Hospital Stay (HOSPITAL_BASED_OUTPATIENT_CLINIC_OR_DEPARTMENT_OTHER): Payer: Medicare PPO | Admitting: Internal Medicine

## 2020-07-26 ENCOUNTER — Inpatient Hospital Stay (HOSPITAL_BASED_OUTPATIENT_CLINIC_OR_DEPARTMENT_OTHER): Payer: Medicare PPO | Admitting: Hospice and Palliative Medicine

## 2020-07-26 ENCOUNTER — Other Ambulatory Visit: Payer: Self-pay

## 2020-07-26 ENCOUNTER — Inpatient Hospital Stay: Payer: Medicare PPO

## 2020-07-26 ENCOUNTER — Encounter: Payer: Self-pay | Admitting: Internal Medicine

## 2020-07-26 ENCOUNTER — Encounter: Payer: Self-pay | Admitting: *Deleted

## 2020-07-26 DIAGNOSIS — C3431 Malignant neoplasm of lower lobe, right bronchus or lung: Secondary | ICD-10-CM

## 2020-07-26 DIAGNOSIS — Z515 Encounter for palliative care: Secondary | ICD-10-CM

## 2020-07-26 DIAGNOSIS — Z5112 Encounter for antineoplastic immunotherapy: Secondary | ICD-10-CM | POA: Diagnosis not present

## 2020-07-26 DIAGNOSIS — C3490 Malignant neoplasm of unspecified part of unspecified bronchus or lung: Secondary | ICD-10-CM

## 2020-07-26 LAB — COMPREHENSIVE METABOLIC PANEL
ALT: 26 U/L (ref 0–44)
AST: 18 U/L (ref 15–41)
Albumin: 3.3 g/dL — ABNORMAL LOW (ref 3.5–5.0)
Alkaline Phosphatase: 73 U/L (ref 38–126)
Anion gap: 9 (ref 5–15)
BUN: 33 mg/dL — ABNORMAL HIGH (ref 8–23)
CO2: 22 mmol/L (ref 22–32)
Calcium: 8.5 mg/dL — ABNORMAL LOW (ref 8.9–10.3)
Chloride: 105 mmol/L (ref 98–111)
Creatinine, Ser: 1.16 mg/dL (ref 0.61–1.24)
GFR calc Af Amer: >60 mL/min (ref >60)
GFR calc non Af Amer: >60 mL/min (ref >60)
Glucose, Bld: 201 mg/dL — ABNORMAL HIGH (ref 70–99)
Potassium: 4.4 mmol/L (ref 3.5–5.1)
Sodium: 136 mmol/L (ref 135–145)
Total Bilirubin: 0.6 mg/dL (ref 0.3–1.2)
Total Protein: 7.2 g/dL (ref 6.5–8.1)

## 2020-07-26 LAB — CBC WITH DIFFERENTIAL/PLATELET
Abs Immature Granulocytes: 0.08 10*3/uL — ABNORMAL HIGH (ref 0.00–0.07)
Basophils Absolute: 0 10*3/uL (ref 0.0–0.1)
Basophils Relative: 0 %
Eosinophils Absolute: 0.1 10*3/uL (ref 0.0–0.5)
Eosinophils Relative: 1 %
HCT: 41.2 % (ref 39.0–52.0)
Hemoglobin: 13.7 g/dL (ref 13.0–17.0)
Immature Granulocytes: 1 %
Lymphocytes Relative: 13 %
Lymphs Abs: 1.6 10*3/uL (ref 0.7–4.0)
MCH: 28.6 pg (ref 26.0–34.0)
MCHC: 33.3 g/dL (ref 30.0–36.0)
MCV: 86 fL (ref 80.0–100.0)
Monocytes Absolute: 0.5 10*3/uL (ref 0.1–1.0)
Monocytes Relative: 4 %
Neutro Abs: 10.1 10*3/uL — ABNORMAL HIGH (ref 1.7–7.7)
Neutrophils Relative %: 81 %
Platelets: 240 10*3/uL (ref 150–400)
RBC: 4.79 MIL/uL (ref 4.22–5.81)
RDW: 12.8 % (ref 11.5–15.5)
WBC: 12.4 10*3/uL — ABNORMAL HIGH (ref 4.0–10.5)
nRBC: 0 % (ref 0.0–0.2)

## 2020-07-26 LAB — TSH: TSH: 0.298 u[IU]/mL — ABNORMAL LOW (ref 0.350–4.500)

## 2020-07-26 MED ORDER — SODIUM CHLORIDE 0.9 % IV SOLN
150.0000 mg | Freq: Once | INTRAVENOUS | Status: AC
  Administered 2020-07-26: 150 mg via INTRAVENOUS
  Filled 2020-07-26: qty 5

## 2020-07-26 MED ORDER — HEPARIN SOD (PORK) LOCK FLUSH 100 UNIT/ML IV SOLN
500.0000 [IU] | Freq: Once | INTRAVENOUS | Status: DC | PRN
  Filled 2020-07-26: qty 5

## 2020-07-26 MED ORDER — SODIUM CHLORIDE 0.9 % IV SOLN
513.5000 mg | Freq: Once | INTRAVENOUS | Status: AC
  Administered 2020-07-26: 510 mg via INTRAVENOUS
  Filled 2020-07-26: qty 51

## 2020-07-26 MED ORDER — PALONOSETRON HCL INJECTION 0.25 MG/5ML
0.2500 mg | Freq: Once | INTRAVENOUS | Status: AC
  Administered 2020-07-26: 0.25 mg via INTRAVENOUS
  Filled 2020-07-26: qty 5

## 2020-07-26 MED ORDER — SODIUM CHLORIDE 0.9 % IV SOLN
Freq: Once | INTRAVENOUS | Status: AC
  Filled 2020-07-26: qty 250

## 2020-07-26 MED ORDER — HEPARIN SOD (PORK) LOCK FLUSH 100 UNIT/ML IV SOLN
500.0000 [IU] | Freq: Once | INTRAVENOUS | Status: AC
  Administered 2020-07-26: 500 [IU] via INTRAVENOUS
  Filled 2020-07-26: qty 5

## 2020-07-26 MED ORDER — SODIUM CHLORIDE 0.9 % IV SOLN
10.0000 mg | Freq: Once | INTRAVENOUS | Status: AC
  Administered 2020-07-26: 10 mg via INTRAVENOUS
  Filled 2020-07-26: qty 10

## 2020-07-26 MED ORDER — SODIUM CHLORIDE 0.9% FLUSH
10.0000 mL | Freq: Once | INTRAVENOUS | Status: AC
  Administered 2020-07-26: 10 mL via INTRAVENOUS
  Filled 2020-07-26: qty 10

## 2020-07-26 MED ORDER — SODIUM CHLORIDE 0.9 % IV SOLN
500.0000 mg/m2 | Freq: Once | INTRAVENOUS | Status: AC
  Administered 2020-07-26: 1100 mg via INTRAVENOUS
  Filled 2020-07-26: qty 40

## 2020-07-26 MED ORDER — HEPARIN SOD (PORK) LOCK FLUSH 100 UNIT/ML IV SOLN
INTRAVENOUS | Status: AC
  Filled 2020-07-26: qty 5

## 2020-07-26 NOTE — Assessment & Plan Note (Addendum)
#   Lung cancer-non-small cell favor adeno ca; Stage IV RLL mass- ~9 cm; with hilar/mediastinal adenopathy; with left adrenal metastases.   #Reviewed the pathology/stage with the patient and son;  Reviewed treatments are palliative not curative.  # Proceed with carboplatin and Alimta chemotherapy; HOLD keytruda cycle #1 [awaiting on NGS]. Labs today reviewed;  acceptable for treatment today.   Also  plan maintenance therapy with Keytruda-indefinite until intolerance/progression.  Discussed the response rates ~50%. MRI Brain-negative for any intracranial metastasis.  #4 mm cervical spine vertebral body-indeterminate lesion monitor for now  Discussed the potential side effects including but not limited to-increasing fatigue, nausea vomiting, diarrhea, hair loss, sores in the mouth, increase risk of infection and also neuropathy. On  folic DXAJ/O87.  Zofran/Compazine/EMLA cream.    I spoke at length with the patient's son regarding the patient's clinical status/plan of care.  Family agreement. Patient also meeting with Josh.  # DISPOSITION: # carbo- alimta today; [name]. # 10 days- labs- cbc/bmp # 3 week-MD; labs- cbc/cmp; carbo-alimta-keytruda- Dr.B  # I reviewed the blood work- with the patient in detail; also reviewed the imaging independently [as summarized above]; and with the patient in detail.

## 2020-07-26 NOTE — Progress Notes (Signed)
[facility] CONSULT NOTE  Patient Care Team: Ezequiel Kayser, MD as PCP - General (Internal Medicine) Telford Nab, RN as Oncology Nurse Navigator  CHIEF COMPLAINTS/PURPOSE OF CONSULTATION: lung nodule/mass  #  Oncology History Overview Note  # July 2021- RLL- 9.4 cm x 7.5 cm x 6.7 cm lobulated low-attenuation heterogeneous lung mass within the posteromedial aspect of the right lower lobe, at the level of the right hilum; positive for hilar; mediastinal adenopathy; 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 ([place])  07/05/2020 Initial Diagnosis   Cancer of lower lobe of right lung ([place])   07/26/2020 -  Chemotherapy   The patient had dexamethasone (DECADRON) 4 MG tablet, 1 of 1 cycle, Start date: --, End date: -- palonosetron (ALOXI) injection 0.25 mg, 0.25 mg, Intravenous,  Once, 0 of 4 cycles PEMEtrexed (ALIMTA) 1,100 mg in sodium chloride 0.9 % 100 mL chemo infusion, 500 mg/m2 = 1,100 mg, Intravenous,  Once, 0 of 6 cycles CARBOplatin (PARAPLATIN) 510 mg in sodium chloride 0.9 % 250 mL chemo infusion, 510 mg (original dose ), Intravenous,  Once, 0 of 4 cycles Dose modification:   (Cycle 1) fosaprepitant (EMEND) 150 mg in sodium chloride 0.9 % 145 mL IVPB, 150 mg, Intravenous,  Once, 0 of 4 cycles pembrolizumab (KEYTRUDA) 200 mg in sodium chloride 0.9 % 50 mL chemo infusion, 200 mg, Intravenous, Once, 0 of 5 cycles  for chemotherapy treatment.       HISTORY OF PRESENTING ILLNESS:  Ralph Williams 79 y.o.  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  In the interim patient had a port placed.   States his breathing has gotten better since he is using albuterol. No headaches. No nausea or vomiting.   Review of Systems  Constitutional: Negative for chills, diaphoresis, fever, malaise/fatigue and weight loss.  HENT: Negative for nosebleeds and sore throat.   Eyes: Negative for double vision.  Respiratory: Positive for cough and wheezing. Negative for hemoptysis, sputum production and shortness of breath.   Cardiovascular: Negative for chest pain, palpitations, orthopnea and leg swelling.  Gastrointestinal: Negative for abdominal pain, blood in stool, constipation, diarrhea, heartburn, melena, nausea and vomiting.  Genitourinary: Negative for dysuria, frequency and urgency.  Musculoskeletal: Negative for back pain and joint pain.  Skin: Negative.  Negative for itching and rash.  Neurological: Negative for dizziness, tingling, focal weakness, weakness and headaches.  Endo/Heme/Allergies: Does not bruise/bleed easily.  Psychiatric/Behavioral: Negative for depression. The patient is not nervous/anxious and does not have insomnia.      MEDICAL HISTORY:  Past Medical History:  Diagnosis Date   Chronic renal insufficiency    Diabetes mellitus without complication ([place])    Pt states it's diet controlled.   GERD (gastroesophageal reflux disease)    Hyperlipidemia associated with type 2 diabetes mellitus ([place])    Hypertension    Lung mass     SURGICAL HISTORY: Past Surgical History:  Procedure Laterality Date   IR IMAGING GUIDED PORT INSERTION  07/22/2020   lymphoma removal     back  SOCIAL HISTORY: Social History   Socioeconomic History   Marital status: Married    Spouse name: Not on file   Number of children: Not on file   Years of education: Not on file   Highest education level: Not on file  Occupational History   Not on file  Tobacco Use   Smoking status: Former Smoker    Quit date: 07/23/2007     Years since quitting: 13.0   Smokeless tobacco: Never Used  Vaping Use   Vaping Use: Never used  Substance and Sexual Activity   Alcohol use: Not on file    Comment: rarely   Drug use: Never   Sexual activity: Not on file  Other Topics Concern   Not on file  Social History Narrative   Moved from [place]; lives in [place] in 2021; daughter lives next door. Quit smoking- 13 years ago. Social alcohol. 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    Social Determinants of Health   Financial Resource Strain:    Difficulty of Paying Living Expenses:   Food Insecurity:    Worried About Charity fundraiser in the Last Year:    Arboriculturist in the Last Year:   Transportation Needs:    Film/video editor (Medical):    Lack of Transportation (Non-Medical):   Physical Activity:    Days of Exercise per Week:    Minutes of Exercise per Session:   Stress:    Feeling of Stress :   Social Connections:    Frequency of Communication with Friends and Family:    Frequency of Social Gatherings with Friends and Family:    Attends Religious Services:    Active Member of Clubs or Organizations:    Attends Music therapist:    Marital Status:   Intimate Partner Violence:    Fear of Current or Ex-Partner:    Emotionally Abused:    Physically Abused:    Sexually Abused:     FAMILY HISTORY: Family History  Problem Relation Age of Onset   Liver cancer Sister    Cancer - Other Brother        bone cancer [2001]    ALLERGIES:  is allergic to niacin and related.  MEDICATIONS:  Current Outpatient Medications  Medication Sig Dispense Refill   acetaminophen (TYLENOL) 500 MG tablet Take 2 tablets by mouth every 8 (eight) hours.      albuterol (VENTOLIN HFA) 108 (90 Base) MCG/ACT inhaler Inhale 2 puffs into the lungs in the morning, at noon, in the evening, and at bedtime.     aspirin 325 MG EC tablet Take 1 tablet by mouth  daily.     atenolol (TENORMIN) 25 MG tablet Take 1 tablet by mouth daily.     atorvastatin (LIPITOR) 40 MG tablet Take 1 tablet by mouth daily.     dexamethasone (DECADRON) 4 MG tablet Take one pill AM & PM; TAKE the day before & day after chemo; DO NOT Take on the day of chemo. 60 tablet 0   folic acid (FOLVITE) 1 MG tablet Take 1 tablet (1 mg total) by mouth daily. 90 tablet 1   hydrochlorothiazide (HYDRODIURIL) 12.5 MG tablet Take 12.5 mg by mouth daily.     lidocaine-prilocaine (EMLA) cream Apply 1 application topically as needed. 30 g 0   losartan (COZAAR) 50 MG tablet Take 1 tablet by mouth daily.     Melatonin 10 MG TABS Take 1 tablet by mouth at bedtime.  Omega-3 1000 MG CAPS Take 1,000 mg by mouth in the morning and at bedtime.     omeprazole (PRILOSEC) 20 MG capsule Take 20 mg by mouth in the morning and at bedtime.     ondansetron (ZOFRAN) 8 MG tablet One pill every 8 hours as needed for nausea/vomitting. 40 tablet 1   prochlorperazine (COMPAZINE) 10 MG tablet Take 1 tablet (10 mg total) by mouth every 6 (six) hours as needed for nausea or vomiting. 40 tablet 1   tadalafil (CIALIS) 20 MG tablet Take 1 tablet by mouth daily.     Zinc 22.5 MG TABS Take 22 mg by mouth daily.     No current facility-administered medications for this visit.   Facility-Administered Medications Ordered in Other Visits  Medication Dose Route Frequency Provider Last Rate Last Admin   heparin lock flush 100 unit/mL  500 Units Intravenous Once Charlaine Dalton R, MD          .  PHYSICAL EXAMINATION: ECOG PERFORMANCE STATUS: 1 - Symptomatic but completely ambulatory  Vitals:   07/26/20 0829  BP: 134/63  Pulse: 74  Resp: 16  Temp: (!) 96.1 F (35.6 C)  SpO2: 98%   Filed Weights   07/26/20 0829  Weight: 229 lb (103.9 kg)    Physical Exam Constitutional:      Comments: Ambulating independently.  Accompanied by his son.   HENT:     Head: Normocephalic and atraumatic.      Mouth/Throat:     Pharynx: No oropharyngeal exudate.  Eyes:     Pupils: Pupils are equal, round, and reactive to light.  Cardiovascular:     Rate and Rhythm: Normal rate and regular rhythm.  Pulmonary:     Effort: Pulmonary effort is normal. No respiratory distress.     Breath sounds: Normal breath sounds. No wheezing.  Abdominal:     General: Bowel sounds are normal. There is no distension.     Palpations: Abdomen is soft. There is no mass.     Tenderness: There is no abdominal tenderness. There is no guarding or rebound.  Musculoskeletal:        General: No tenderness. Normal range of motion.     Cervical back: Normal range of motion and neck supple.  Skin:    General: Skin is warm.  Neurological:     Mental Status: He is alert and oriented to person, place, and time.  Psychiatric:        Mood and Affect: Affect normal.      LABORATORY DATA:  I have reviewed the data as listed Lab Results  Component Value Date   WBC 12.4 (H) 07/26/2020   HGB 13.7 07/26/2020   HCT 41.2 07/26/2020   MCV 86.0 07/26/2020   PLT 240 07/26/2020   Recent Labs    07/05/20 1002 07/26/20 0816  NA 136 136  K 4.0 4.4  CL 103 105  CO2 23 22  GLUCOSE 136* 201*  BUN 26* 33*  CREATININE 1.16 1.16  CALCIUM 8.7* 8.5*  GFRNONAA >60 >60  GFRAA >60 >60  PROT 7.4 7.2  ALBUMIN 3.5 3.3*  AST 18 18  ALT 34 26  ALKPHOS 67 73  BILITOT 0.6 0.6    RADIOGRAPHIC STUDIES: I have personally reviewed the radiological images as listed and agreed with the findings in the report. CT CHEST W CONTRAST  Result Date: 06/28/2020 CLINICAL DATA:  Wheezing with congestion x6 months. EXAM: CT CHEST WITH CONTRAST TECHNIQUE: Multidetector CT imaging of the chest  was performed during intravenous contrast administration. CONTRAST:  66m OMNIPAQUE IOHEXOL 300 MG/ML  SOLN COMPARISON:  None. FINDINGS: Cardiovascular: No significant vascular findings. Normal heart size. No pericardial effusion. Mediastinum/Nodes: There is  mild right hilar and mediastinal lymphadenopathy. Thyroid gland, trachea, and esophagus demonstrate no significant findings. Lungs/Pleura: A 9.4 cm x 7.5 cm x 6.7 cm lobulated low-attenuation heterogeneous lung mass is seen within the posteromedial aspect of the right lower lobe, at the level of the right hilum. There is no evidence of a pleural effusion or pneumothorax. Upper Abdomen: Multiple simple cysts are seen within both kidneys. Musculoskeletal: No chest wall abnormality. No acute or significant osseous findings. IMPRESSION: 1. 9.4 cm x 7.5 cm x 6.7 cm lobulated low-attenuation heterogeneous lung mass within the posteromedial aspect of the right lower lobe, at the level of the right hilum. This is concerning for a primary lung malignancy. 2. Mild right hilar and mediastinal lymphadenopathy. 3. Multiple simple cysts within both kidneys. Electronically Signed   By: TVirgina NorfolkM.D.   On: 06/28/2020 20:37   MR Brain W Wo Contrast  Result Date: 07/21/2020 CLINICAL DATA:  Malignant neoplasm of unspecified part of unspecified bronchus or lung. Non-small cell lung cancer, staging. EXAM: MRI HEAD WITHOUT AND WITH CONTRAST TECHNIQUE: Multiplanar, multiecho pulse sequences of the brain and surrounding structures were obtained without and with intravenous contrast. CONTRAST:  10 mL Gadavist intravenous contrast. COMPARISON:  No prior imaging of the head is available for comparison., prior PET-CT 07/11/2020. FINDINGS: Brain: Mild generalized parenchymal atrophy. Mild scattered T2/FLAIR hyperintensity within the cerebral white matter is nonspecific, but consistent with chronic small vessel ischemic disease. No abnormal enhance is demonstrated to suggest intracranial metastatic disease. There is no acute infarct. No evidence of intracranial mass. No chronic intracranial blood products. No extra-axial fluid collection. No midline shift. Vascular: Expected proximal arterial flow voids. Skull and upper cervical  spine: Indeterminate 4 mm T1 hypointense lesion within the dens (series 9, image 13). No focal calvarial marrow lesion is identified. Sinuses/Orbits: Visualized orbits show no acute finding. Mild ethmoid sinus mucosal thickening. Bilateral maxillary sinus mucous retention cysts. No significant mastoid effusion. IMPRESSION: No evidence of intracranial metastatic disease. Indeterminate 4 mm well-circumscribed lesion within the dens. Attention recommended on follow-up. Mild generalized parenchymal atrophy and chronic small vessel ischemic changes. Mild ethmoid sinus mucosal thickening. Bilateral maxillary sinus mucous retention cysts. Electronically Signed   By: KKellie SimmeringDO   On: 07/21/2020 17:18   NM PET Image Initial (PI) Skull Base To Thigh  Result Date: 07/11/2020 CLINICAL DATA:  Initial treatment strategy for chest CT demonstrating right lower lobe lung mass, thoracic adenopathy. EXAM: NUCLEAR MEDICINE PET SKULL BASE TO THIGH TECHNIQUE: 12.6 mCi F-18 FDG was injected intravenously. Full-ring PET imaging was performed from the skull base to thigh after the radiotracer. CT data was obtained and used for attenuation correction and anatomic localization. Fasting blood glucose: 103 mg/dl COMPARISON:  Chest CT 06/28/2020 FINDINGS: Mediastinal blood pool activity: SUV max 3.0 Liver activity: SUV max NA NECK: No areas of abnormal hypermetabolism. Incidental CT findings: Right maxillary sinus mucous retention cyst or polyp. No cervical adenopathy. Left carotid atherosclerosis. CHEST: Bilateral mediastinal hypermetabolic adenopathy. Example AP window node at 1.2 cm and a S.U.V. max of 11.4 on 79/3. Subcarinal nodal mass measures 2.6 cm and a S.U.V. max of 13.7 on 93/3. Similar to on the prior exam. The central right lower lobe lung mass with direct mediastinal invasion measures 8.8 x 6.7 cm and a S.U.V.  max of 18.8 on 102/3. Incidental CT findings: Aortic and coronary artery atherosclerosis. ABDOMEN/PELVIS: Left  adrenal nodule measures 9 mm and a S.U.V. max of 7.1 on 143/3. This is similar in size to on the prior diagnostic CT. No abdominopelvic nodal hypermetabolism. Low anal hypermetabolism is without CT correlate and favored to be physiologic. Incidental CT findings: Aortic atherosclerosis. An upper pole right renal 1.4 cm lesion measures 30 HU on 148/3, up to 76 HU on the prior, postcontrast CT. An upper pole left renal lesion measures 25 HU on 148/3, up to 53 HU on the prior diagnostic CT. 1.4 cm. SKELETON: No abnormal marrow activity. Incidental CT findings: No acute osseous abnormality. IMPRESSION: 1. 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 2. Bilateral indeterminate renal lesions. Suspicious for solid renal cell carcinoma or carcinomas, given comparison to prior contrast enhanced chest CT. These could either be re-evaluated at follow-up or more entirely characterized with pre and post contrast renal protocol CT or MRI. 3. Incidental findings, including: Coronary artery atherosclerosis. Aortic Atherosclerosis (ICD10-I70.0). Sinus disease. Electronically Signed   By: Abigail Miyamoto M.D.   On: 07/11/2020 14:22   CT Biopsy  Result Date: 07/16/2020 INDICATION: Concern for bronchogenic carcinoma. Please perform CT-guided biopsy for tissue diagnostic purposes. EXAM: CT-GUIDED BIOPSY OF HYPERMETABOLIC RIGHT LOWER LOBE PULMONARY MASS COMPARISON:  PET-CT-07/11/2020; chest CT-06/28/2020 MEDICATIONS: None. ANESTHESIA/SEDATION: Fentanyl 50 mcg IV; Versed 1 mg IV Sedation time: 15 minutes; The patient was continuously monitored during the procedure by the interventional radiology nurse under my direct supervision. CONTRAST:  None COMPLICATIONS: None immediate. PROCEDURE: Informed consent was obtained from the patient following an explanation of the procedure, risks, benefits and alternatives. The patient understands,agrees and consents for the procedure. All  questions were addressed. A time out was performed prior to the initiation of the procedure. The patient was positioned prone on the CT table and a limited chest CT was performed for procedural planning demonstrating unchanged size and appearance the at least 8.0 x 6.1 cm right lower lobe pulmonary mass (image 29, series 2). The operative site was prepped and draped in the usual sterile fashion. Under sterile conditions and local anesthesia, a 17 gauge coaxial needle was advanced into the peripheral aspect of the nodule. Positioning was confirmed with intermittent CT fluoroscopy and followed by the acquisition of 3 core needle biopsy samples with an 18 gauge core needle biopsy device. The coaxial needle was removed following deployment of a Biosentry plug and superficial hemostasis was achieved with manual compression. Limited post procedural chest CT was negative for pneumothorax or additional complication. A dressing was placed. The patient tolerated the procedure well without immediate postprocedural complication. The patient was escorted to have an upright chest radiograph. IMPRESSION: Technically successful CT guided core needle core biopsy of hypermetabolic right lower lobe pulmonary mass. Electronically Signed   By: Sandi Mariscal M.D.   On: 07/16/2020 10:33   DG Chest Port 1 View  Result Date: 07/16/2020 CLINICAL DATA:  Post right lower lobe pulmonary mass biopsy. EXAM: PORTABLE CHEST 1 VIEW COMPARISON:  06/11/2020; chest CT-06/28/2020; PET-CT-07/11/2020 FINDINGS: Grossly unchanged cardiac silhouette and mediastinal contours with persistent partial obscuration of the right heart border secondary to known large right lower lobe pulmonary mass. No evidence of complication following recent right lower lobe pulmonary mass biopsy. Specifically, no pneumothorax or pleural effusion. No new focal airspace opacities. No evidence of edema. No acute osseous abnormalities. Degenerative change the bilateral glenohumeral  joints is suspected though  incompletely evaluated. IMPRESSION: No evidence of complication following right lower lobe pulmonary mass biopsy. Specifically, no pneumothorax. Electronically Signed   By: Sandi Mariscal M.D.   On: 07/16/2020 10:32   IR IMAGING GUIDED PORT INSERTION  Result Date: 07/22/2020 INDICATION: History of metastatic lung cancer. Patient presents today for [facility] a catheter placement for durable intravenous access for chemotherapy administration. EXAM: IMPLANTED PORT A CATH PLACEMENT WITH ULTRASOUND AND FLUOROSCOPIC GUIDANCE COMPARISON:  PET-CT-07/16/2020 MEDICATIONS: Ancef 2 gm IV; The antibiotic was administered within an appropriate time interval prior to skin puncture. ANESTHESIA/SEDATION: Moderate (conscious) sedation was employed during this procedure. A total of Versed 1 mg and Fentanyl 50 mcg was administered intravenously. Moderate Sedation Time: 25 minutes. The patient's level of consciousness and vital signs were monitored continuously by radiology nursing throughout the procedure under my direct supervision. CONTRAST:  None FLUOROSCOPY TIME:  30 seconds (5.5 mGy) COMPLICATIONS: None immediate. PROCEDURE: The procedure, risks, benefits, and alternatives were explained to the patient. Questions regarding the procedure were encouraged and answered. The patient understands and consents to the procedure. The right neck and chest were prepped with chlorhexidine in a sterile fashion, and a sterile drape was applied covering the operative field. Maximum barrier sterile technique with sterile gowns and gloves were used for the procedure. A timeout was performed prior to the initiation of the procedure. Local anesthesia was provided with 1% lidocaine with epinephrine. After creating a small venotomy incision, a micropuncture kit was utilized to access the internal jugular vein. Real-time ultrasound guidance was utilized for vascular access including the acquisition of a permanent ultrasound image  documenting patency of the accessed vessel. The microwire was utilized to measure appropriate catheter length. A subcutaneous port pocket was then created along the upper chest wall utilizing a combination of sharp and blunt dissection. The pocket was irrigated with sterile saline. A single lumen "standard sized" power injectable port was chosen for placement. The 8 Fr catheter was tunneled from the port pocket site to the venotomy incision. The port was placed in the pocket. The external catheter was trimmed to appropriate length. At the venotomy, an 8 Fr peel-away sheath was placed over a guidewire under fluoroscopic guidance. The catheter was then placed through the sheath and the sheath was removed. Final catheter positioning was confirmed and documented with a fluoroscopic spot radiograph. The port was accessed with a Huber needle, aspirated and flushed with heparinized saline. The venotomy site was closed with an interrupted 4-0 Vicryl suture. The port pocket incision was closed with interrupted 2-0 Vicryl suture. Dermabond and Steri-strips were applied to both incisions. Dressings were applied. The patient tolerated the procedure well without immediate post procedural complication. FINDINGS: After catheter placement, the tip lies within the superior cavoatrial junction. The catheter aspirates and flushes normally and is ready for immediate use. IMPRESSION: Successful placement of a right internal jugular approach power injectable Port-A-Cath. The catheter is ready for immediate use. Electronically Signed   By: Sandi Mariscal M.D.   On: 07/22/2020 11:52    ASSESSMENT & PLAN:   Cancer of lower lobe of right lung ([place]) # Lung cancer-non-small cell favor adeno ca; Stage IV RLL mass- ~9 cm; with hilar/mediastinal adenopathy; with left adrenal metastases.   #Reviewed the pathology/stage with the patient and son;  Reviewed treatments are palliative not curative.  # Proceed with carboplatin and Alimta  chemotherapy; HOLD keytruda cycle #1 [awaiting on NGS]. Labs today reviewed;  acceptable for treatment today.   Also  plan maintenance therapy with  Keytruda-indefinite until intolerance/progression.  Discussed the response rates ~50%. MRI Brain-negative for any intracranial metastasis.  #4 mm cervical spine vertebral body-indeterminate lesion monitor for now  Discussed the potential side effects including but not limited to-increasing fatigue, nausea vomiting, diarrhea, hair loss, sores in the mouth, increase risk of infection and also neuropathy. On  folic LDJT/T01.  Zofran/Compazine/EMLA cream.    I spoke at length with the patient's son regarding the patient's clinical status/plan of care.  Family agreement. Patient also meeting with Josh.  # DISPOSITION: # carbo- alimta today; [name]. # 10 days- labs- cbc/bmp # 3 week-MD; labs- cbc/cmp; carbo-alimta-keytruda- Dr.B  # I reviewed the blood work- with the patient in detail; also reviewed the imaging independently [as summarized above]; and with the patient in detail.     All questions were answered. The patient knows to call the clinic with any problems, questions or concerns.    Cammie Sickle, MD 07/26/2020 9:24 AM

## 2020-07-26 NOTE — Progress Notes (Signed)
  Oncology Nurse Navigator Documentation  Navigator Location: [facility]-Med Onc (07/26/20 1000)   )Navigator Encounter Type: Follow-up Appt;Treatment (07/26/20 1000)                   Treatment Initiated Date: 07/26/20 (07/26/20 1000) Patient Visit Type: MedOnc (07/26/20 1000) Treatment Phase: First Chemo Tx (07/26/20 1000) Barriers/Navigation Needs: No Barriers At This Time (07/26/20 1000)   Interventions: None Required (07/26/20 1000)           met with patient during follow up visit with Dr. Rogue Bussing prior to first chemo treatment today. All questions answered during visit. Reviewed upcoming appts. Pt instructed to call with any further questions or needs. Pt verbalized understanding.            Time Spent with Patient: 30 (07/26/20 1000)

## 2020-07-26 NOTE — Progress Notes (Signed)
[facility]  Telephone:([phone] [phone]   Name: Ralph Williams Date: 07/26/2020 MRN: [id number]  DOB: 20-Jan-1941  Patient Care Team: Ezequiel Kayser, MD as PCP - General (Internal Medicine) Telford Nab, RN as Oncology Nurse Navigator    REASON FOR CONSULTATION: Ralph Williams is a 79 y.o. male with multiple medical problems including stage IV non-small cell lung cancer diagnosed July 2021 and was on systemic chemotherapy.  Palliative care was referred to discuss goals and support patient for treatment.  SOCIAL HISTORY:     reports that he quit smoking about 13 years ago. He has never used smokeless tobacco. He reports that he does not use drugs.   Patient is married and lives at home with his wife. Patient moved to [place] from [facility] to live near his daughter, who now lives next door.  Patient has 4 children.  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  ADVANCE DIRECTIVES:  Not on file  CODE STATUS:   PAST MEDICAL HISTORY: Past Medical History:  Diagnosis Date  . Chronic renal insufficiency   . Diabetes mellitus without complication ([place])    Pt states it's diet controlled.  Marland Kitchen GERD (gastroesophageal reflux disease)   . Hyperlipidemia associated with type 2 diabetes mellitus ([place])   . Hypertension   . Lung mass     PAST SURGICAL HISTORY:  Past Surgical History:  Procedure Laterality Date  . IR IMAGING GUIDED PORT INSERTION  07/22/2020  . lymphoma removal     back    HEMATOLOGY/ONCOLOGY HISTORY:  Oncology History Overview Note  # July 2021- RLL- 9.4 cm x 7.5 cm x 6.7 cm lobulated low-attenuation heterogeneous lung mass within the posteromedial aspect of the right lower lobe, at the level of the right hilum; positive for hilar; mediastinal adenopathy; PET scan-July 2021 + for adrenal metastases; CT-guided biopsy-non-small cell favor adenocarcinoma; necrosis  #  carbo-alimta-keytruda  # CAD- 1995 s/p cath; No stents; # ? 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 ([place])  07/05/2020 Initial Diagnosis   Cancer of lower lobe of right lung ([place])   07/26/2020 -  Chemotherapy   The patient had dexamethasone (DECADRON) 4 MG tablet, 1 of 1 cycle, Start date: --, End date: -- palonosetron (ALOXI) injection 0.25 mg, 0.25 mg, Intravenous,  Once, 1 of 4 cycles PEMEtrexed (ALIMTA) 1,100 mg in sodium chloride 0.9 % 100 mL chemo infusion, 500 mg/m2 = 1,100 mg, Intravenous,  Once, 1 of 6 cycles CARBOplatin (PARAPLATIN) 510 mg in sodium chloride 0.9 % 250 mL chemo infusion, 510 mg (100 % of original dose 513.5 mg), Intravenous,  Once, 1 of 4 cycles Dose modification:   (original dose 513.5 mg, Cycle 1) fosaprepitant (EMEND) 150 mg in sodium chloride 0.9 % 145 mL IVPB, 150 mg, Intravenous,  Once, 1 of 4 cycles pembrolizumab (KEYTRUDA) 200 mg in sodium chloride 0.9 % 50 mL chemo infusion, 200 mg, Intravenous, Once, 0 of 5 cycles  for chemotherapy treatment.      ALLERGIES:  is allergic to niacin and related.  MEDICATIONS:  Current Outpatient Medications  Medication Sig Dispense Refill  . acetaminophen (TYLENOL) 500 MG tablet Take 2 tablets by mouth every 8 (eight) hours.     Marland Kitchen  albuterol (VENTOLIN HFA) 108 (90 Base) MCG/ACT inhaler Inhale 2 puffs into the lungs in the morning, at noon, in the evening, and at bedtime.    Marland Kitchen aspirin 325 MG EC tablet Take 1 tablet by mouth daily.    Marland Kitchen atenolol (TENORMIN) 25 MG tablet Take 1 tablet by mouth daily.    Marland Kitchen atorvastatin (LIPITOR) 40 MG tablet Take 1 tablet by mouth daily.    Marland Kitchen dexamethasone (DECADRON) 4 MG tablet Take one pill AM & PM; TAKE the day before & day after chemo; DO NOT Take on the day of chemo. 60 tablet 0  . folic acid  (FOLVITE) 1 MG tablet Take 1 tablet (1 mg total) by mouth daily. 90 tablet 1  . hydrochlorothiazide (HYDRODIURIL) 12.5 MG tablet Take 12.5 mg by mouth daily.    Marland Kitchen lidocaine-prilocaine (EMLA) cream Apply 1 application topically as needed. 30 g 0  . losartan (COZAAR) 50 MG tablet Take 1 tablet by mouth daily.    . Melatonin 10 MG TABS Take 1 tablet by mouth at bedtime.    . Omega-3 1000 MG CAPS Take 1,000 mg by mouth in the morning and at bedtime.    Marland Kitchen omeprazole (PRILOSEC) 20 MG capsule Take 20 mg by mouth in the morning and at bedtime.    . ondansetron (ZOFRAN) 8 MG tablet One pill every 8 hours as needed for nausea/vomitting. 40 tablet 1  . prochlorperazine (COMPAZINE) 10 MG tablet Take 1 tablet (10 mg total) by mouth every 6 (six) hours as needed for nausea or vomiting. 40 tablet 1  . tadalafil (CIALIS) 20 MG tablet Take 1 tablet by mouth daily.    . Zinc 22.5 MG TABS Take 22 mg by mouth daily.     No current facility-administered medications for this visit.   Facility-Administered Medications Ordered in Other Visits  Medication Dose Route Frequency Provider Last Rate Last Admin  . CARBOplatin (PARAPLATIN) 510 mg in sodium chloride 0.9 % 250 mL chemo infusion  510 mg Intravenous Once Charlaine Dalton R, MD      . dexamethasone (DECADRON) 10 mg in sodium chloride 0.9 % 50 mL IVPB  10 mg Intravenous Once Charlaine Dalton R, MD      . fosaprepitant (EMEND) 150 mg in sodium chloride 0.9 % 145 mL IVPB  150 mg Intravenous Once Charlaine Dalton R, MD      . heparin lock flush 100 unit/mL  500 Units Intravenous Once Charlaine Dalton R, MD      . heparin lock flush 100 unit/mL  500 Units Intracatheter Once PRN Cammie Sickle, MD      . palonosetron (ALOXI) injection 0.25 mg  0.25 mg Intravenous Once Charlaine Dalton R, MD      . PEMEtrexed (ALIMTA) 1,100 mg in sodium chloride 0.9 % 100 mL chemo infusion  500 mg/m2 (Treatment Plan Recorded) Intravenous Once Cammie Sickle,  MD        VITAL SIGNS: There were no vitals taken for this visit. There were no vitals filed for this visit.  Estimated body mass index is 35.34 kg/m as calculated from the following:   Height as of an earlier encounter on 07/26/20: 5' 7.5" (1.715 m).   Weight as of an earlier encounter on 07/26/20: 229 lb (103.9 kg).  LABS: CBC:    Component Value Date/Time   WBC 12.4 (H) 07/26/2020 0816   HGB 13.7 07/26/2020 0816   HCT 41.2 07/26/2020 0816   PLT 240 07/26/2020 0816   MCV  86.0 07/26/2020 0816   NEUTROABS 10.1 (H) 07/26/2020 0816   LYMPHSABS 1.6 07/26/2020 0816   MONOABS 0.5 07/26/2020 0816   EOSABS 0.1 07/26/2020 0816   BASOSABS 0.0 07/26/2020 0816   Comprehensive Metabolic Panel:    Component Value Date/Time   NA 136 07/26/2020 0816   K 4.4 07/26/2020 0816   CL 105 07/26/2020 0816   CO2 22 07/26/2020 0816   BUN 33 (H) 07/26/2020 0816   CREATININE 1.16 07/26/2020 0816   GLUCOSE 201 (H) 07/26/2020 0816   CALCIUM 8.5 (L) 07/26/2020 0816   AST 18 07/26/2020 0816   ALT 26 07/26/2020 0816   ALKPHOS 73 07/26/2020 0816   BILITOT 0.6 07/26/2020 0816   PROT 7.2 07/26/2020 0816   ALBUMIN 3.3 (L) 07/26/2020 0816    RADIOGRAPHIC STUDIES: CT CHEST W CONTRAST  Result Date: 06/28/2020 CLINICAL DATA:  Wheezing with congestion x6 months. EXAM: CT CHEST WITH CONTRAST TECHNIQUE: Multidetector CT imaging of the chest was performed during intravenous contrast administration. CONTRAST:  75m OMNIPAQUE IOHEXOL 300 MG/ML  SOLN COMPARISON:  None. FINDINGS: Cardiovascular: No significant vascular findings. Normal heart size. No pericardial effusion. Mediastinum/Nodes: There is mild right hilar and mediastinal lymphadenopathy. Thyroid gland, trachea, and esophagus demonstrate no significant findings. Lungs/Pleura: A 9.4 cm x 7.5 cm x 6.7 cm lobulated low-attenuation heterogeneous lung mass is seen within the posteromedial aspect of the right lower lobe, at the level of the right hilum. There is no  evidence of a pleural effusion or pneumothorax. Upper Abdomen: Multiple simple cysts are seen within both kidneys. Musculoskeletal: No chest wall abnormality. No acute or significant osseous findings. IMPRESSION: 1. 9.4 cm x 7.5 cm x 6.7 cm lobulated low-attenuation heterogeneous lung mass within the posteromedial aspect of the right lower lobe, at the level of the right hilum. This is concerning for a primary lung malignancy. 2. Mild right hilar and mediastinal lymphadenopathy. 3. Multiple simple cysts within both kidneys. Electronically Signed   By: TVirgina NorfolkM.D.   On: 06/28/2020 20:37   MR Brain W Wo Contrast  Result Date: 07/21/2020 CLINICAL DATA:  Malignant neoplasm of unspecified part of unspecified bronchus or lung. Non-small cell lung cancer, staging. EXAM: MRI HEAD WITHOUT AND WITH CONTRAST TECHNIQUE: Multiplanar, multiecho pulse sequences of the brain and surrounding structures were obtained without and with intravenous contrast. CONTRAST:  10 mL Gadavist intravenous contrast. COMPARISON:  No prior imaging of the head is available for comparison., prior PET-CT 07/11/2020. FINDINGS: Brain: Mild generalized parenchymal atrophy. Mild scattered T2/FLAIR hyperintensity within the cerebral white matter is nonspecific, but consistent with chronic small vessel ischemic disease. No abnormal enhance is demonstrated to suggest intracranial metastatic disease. There is no acute infarct. No evidence of intracranial mass. No chronic intracranial blood products. No extra-axial fluid collection. No midline shift. Vascular: Expected proximal arterial flow voids. Skull and upper cervical spine: Indeterminate 4 mm T1 hypointense lesion within the dens (series 9, image 13). No focal calvarial marrow lesion is identified. Sinuses/Orbits: Visualized orbits show no acute finding. Mild ethmoid sinus mucosal thickening. Bilateral maxillary sinus mucous retention cysts. No significant mastoid effusion. IMPRESSION: No  evidence of intracranial metastatic disease. Indeterminate 4 mm well-circumscribed lesion within the dens. Attention recommended on follow-up. Mild generalized parenchymal atrophy and chronic small vessel ischemic changes. Mild ethmoid sinus mucosal thickening. Bilateral maxillary sinus mucous retention cysts. Electronically Signed   By: KKellie SimmeringDO   On: 07/21/2020 17:18   NM PET Image Initial (PI) Skull Base To Thigh  Result  Date: 07/11/2020 CLINICAL DATA:  Initial treatment strategy for chest CT demonstrating right lower lobe lung mass, thoracic adenopathy. EXAM: NUCLEAR MEDICINE PET SKULL BASE TO THIGH TECHNIQUE: 12.6 mCi F-18 FDG was injected intravenously. Full-ring PET imaging was performed from the skull base to thigh after the radiotracer. CT data was obtained and used for attenuation correction and anatomic localization. Fasting blood glucose: 103 mg/dl COMPARISON:  Chest CT 06/28/2020 FINDINGS: Mediastinal blood pool activity: SUV max 3.0 Liver activity: SUV max NA NECK: No areas of abnormal hypermetabolism. Incidental CT findings: Right maxillary sinus mucous retention cyst or polyp. No cervical adenopathy. Left carotid atherosclerosis. CHEST: Bilateral mediastinal hypermetabolic adenopathy. Example AP window node at 1.2 cm and a S.U.V. max of 11.4 on 79/3. Subcarinal nodal mass measures 2.6 cm and a S.U.V. max of 13.7 on 93/3. Similar to on the prior exam. The central right lower lobe lung mass with direct mediastinal invasion measures 8.8 x 6.7 cm and a S.U.V. max of 18.8 on 102/3. Incidental CT findings: Aortic and coronary artery atherosclerosis. ABDOMEN/PELVIS: Left adrenal nodule measures 9 mm and a S.U.V. max of 7.1 on 143/3. This is similar in size to on the prior diagnostic CT. No abdominopelvic nodal hypermetabolism. Low anal hypermetabolism is without CT correlate and favored to be physiologic. Incidental CT findings: Aortic atherosclerosis. An upper pole right renal 1.4 cm lesion  measures 30 HU on 148/3, up to 76 HU on the prior, postcontrast CT. An upper pole left renal lesion measures 25 HU on 148/3, up to 53 HU on the prior diagnostic CT. 1.4 cm. SKELETON: No abnormal marrow activity. Incidental CT findings: No acute osseous abnormality. IMPRESSION: 1. 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 2. Bilateral indeterminate renal lesions. Suspicious for solid renal cell carcinoma or carcinomas, given comparison to prior contrast enhanced chest CT. These could either be re-evaluated at follow-up or more entirely characterized with pre and post contrast renal protocol CT or MRI. 3. Incidental findings, including: Coronary artery atherosclerosis. Aortic Atherosclerosis (ICD10-I70.0). Sinus disease. Electronically Signed   By: Abigail Miyamoto M.D.   On: 07/11/2020 14:22   CT Biopsy  Result Date: 07/16/2020 INDICATION: Concern for bronchogenic carcinoma. Please perform CT-guided biopsy for tissue diagnostic purposes. EXAM: CT-GUIDED BIOPSY OF HYPERMETABOLIC RIGHT LOWER LOBE PULMONARY MASS COMPARISON:  PET-CT-07/11/2020; chest CT-06/28/2020 MEDICATIONS: None. ANESTHESIA/SEDATION: Fentanyl 50 mcg IV; Versed 1 mg IV Sedation time: 15 minutes; The patient was continuously monitored during the procedure by the interventional radiology nurse under my direct supervision. CONTRAST:  None COMPLICATIONS: None immediate. PROCEDURE: Informed consent was obtained from the patient following an explanation of the procedure, risks, benefits and alternatives. The patient understands,agrees and consents for the procedure. All questions were addressed. A time out was performed prior to the initiation of the procedure. The patient was positioned prone on the CT table and a limited chest CT was performed for procedural planning demonstrating unchanged size and appearance the at least 8.0 x 6.1 cm right lower lobe pulmonary mass (image 29, series 2).  The operative site was prepped and draped in the usual sterile fashion. Under sterile conditions and local anesthesia, a 17 gauge coaxial needle was advanced into the peripheral aspect of the nodule. Positioning was confirmed with intermittent CT fluoroscopy and followed by the acquisition of 3 core needle biopsy samples with an 18 gauge core needle biopsy device. The coaxial needle was removed following deployment of a Biosentry plug and superficial hemostasis was achieved with manual  compression. Limited post procedural chest CT was negative for pneumothorax or additional complication. A dressing was placed. The patient tolerated the procedure well without immediate postprocedural complication. The patient was escorted to have an upright chest radiograph. IMPRESSION: Technically successful CT guided core needle core biopsy of hypermetabolic right lower lobe pulmonary mass. Electronically Signed   By: Sandi Mariscal M.D.   On: 07/16/2020 10:33   DG Chest Port 1 View  Result Date: 07/16/2020 CLINICAL DATA:  Post right lower lobe pulmonary mass biopsy. EXAM: PORTABLE CHEST 1 VIEW COMPARISON:  06/11/2020; chest CT-06/28/2020; PET-CT-07/11/2020 FINDINGS: Grossly unchanged cardiac silhouette and mediastinal contours with persistent partial obscuration of the right heart border secondary to known large right lower lobe pulmonary mass. No evidence of complication following recent right lower lobe pulmonary mass biopsy. Specifically, no pneumothorax or pleural effusion. No new focal airspace opacities. No evidence of edema. No acute osseous abnormalities. Degenerative change the bilateral glenohumeral joints is suspected though incompletely evaluated. IMPRESSION: No evidence of complication following right lower lobe pulmonary mass biopsy. Specifically, no pneumothorax. Electronically Signed   By: Sandi Mariscal M.D.   On: 07/16/2020 10:32   IR IMAGING GUIDED PORT INSERTION  Result Date: 07/22/2020 INDICATION: History of  metastatic lung cancer. Patient presents today for [facility] a catheter placement for durable intravenous access for chemotherapy administration. EXAM: IMPLANTED PORT A CATH PLACEMENT WITH ULTRASOUND AND FLUOROSCOPIC GUIDANCE COMPARISON:  PET-CT-07/16/2020 MEDICATIONS: Ancef 2 gm IV; The antibiotic was administered within an appropriate time interval prior to skin puncture. ANESTHESIA/SEDATION: Moderate (conscious) sedation was employed during this procedure. A total of Versed 1 mg and Fentanyl 50 mcg was administered intravenously. Moderate Sedation Time: 25 minutes. The patient's level of consciousness and vital signs were monitored continuously by radiology nursing throughout the procedure under my direct supervision. CONTRAST:  None FLUOROSCOPY TIME:  30 seconds (5.5 mGy) COMPLICATIONS: None immediate. PROCEDURE: The procedure, risks, benefits, and alternatives were explained to the patient. Questions regarding the procedure were encouraged and answered. The patient understands and consents to the procedure. The right neck and chest were prepped with chlorhexidine in a sterile fashion, and a sterile drape was applied covering the operative field. Maximum barrier sterile technique with sterile gowns and gloves were used for the procedure. A timeout was performed prior to the initiation of the procedure. Local anesthesia was provided with 1% lidocaine with epinephrine. After creating a small venotomy incision, a micropuncture kit was utilized to access the internal jugular vein. Real-time ultrasound guidance was utilized for vascular access including the acquisition of a permanent ultrasound image documenting patency of the accessed vessel. The microwire was utilized to measure appropriate catheter length. A subcutaneous port pocket was then created along the upper chest wall utilizing a combination of sharp and blunt dissection. The pocket was irrigated with sterile saline. A single lumen "standard sized" power  injectable port was chosen for placement. The 8 Fr catheter was tunneled from the port pocket site to the venotomy incision. The port was placed in the pocket. The external catheter was trimmed to appropriate length. At the venotomy, an 8 Fr peel-away sheath was placed over a guidewire under fluoroscopic guidance. The catheter was then placed through the sheath and the sheath was removed. Final catheter positioning was confirmed and documented with a fluoroscopic spot radiograph. The port was accessed with a Huber needle, aspirated and flushed with heparinized saline. The venotomy site was closed with an interrupted 4-0 Vicryl suture. The port pocket incision was closed with interrupted 2-0  Vicryl suture. Dermabond and Steri-strips were applied to both incisions. Dressings were applied. The patient tolerated the procedure well without immediate post procedural complication. FINDINGS: After catheter placement, the tip lies within the superior cavoatrial junction. The catheter aspirates and flushes normally and is ready for immediate use. IMPRESSION: Successful placement of a right internal jugular approach power injectable Port-A-Cath. The catheter is ready for immediate use. Electronically Signed   By: Sandi Mariscal M.D.   On: 07/22/2020 11:52    PERFORMANCE STATUS (ECOG) : 0 - Asymptomatic  Review of Systems Unless otherwise noted, a complete review of systems is negative.  Physical Exam General: NAD Pulmonary: Unlabored Extremities: no edema, no joint deformities Skin: no rashes Neurological:  nonfocal  IMPRESSION: I met with patient and his son today.  I introduced palliative care services and attempted to establish therapeutic rapport.  Patient reports that he is doing extremely well.  He has no symptoms at present.  He is functionally independent with all of his own care.  He reports having a good support system from his family.  Patient's goals are aligned with continued treatment.  He says  he is trying to remain optimistic.  Patient says that he has previous advanced directives in his safe at home.  He will bring those to the clinic to be scanned into the chart.  Patient says that his brother was resuscitated at end-of-life in [place].  He has some context for making end-of-life decisions.  I did review with him a MOST form, which he took him to discuss with family.  PLAN: -Continue current scope of treatment -Patient to bring in ACP documents -MOST Form reviewed -Follow-up MyChart visit in about 6 weeks  Case and plan discussed with Dr. Rogue Bussing   Patient expressed understanding and was in agreement with this plan. He also understands that He can call the clinic at any time with any questions, concerns, or complaints.     Time Total: 15 minutes  Visit consisted of counseling and education dealing with the complex and emotionally intense issues of symptom management and palliative care in the setting of serious and potentially life-threatening illness.Greater than 50%  of this time was spent counseling and coordinating care related to the above assessment and plan.  Signed by: Altha Harm, PhD, NP-C

## 2020-07-26 NOTE — Progress Notes (Signed)
H/T- please order Thyroid profile at next visit in 3 weeks.  ---------------------------------------------------------------------------------- Hi- your thyroid labs are slightly; abnormal. We will repeat at next visit. GB

## 2020-07-29 ENCOUNTER — Telehealth: Payer: Self-pay | Admitting: *Deleted

## 2020-07-29 ENCOUNTER — Telehealth: Payer: Self-pay

## 2020-07-29 DIAGNOSIS — C3431 Malignant neoplasm of lower lobe, right bronchus or lung: Secondary | ICD-10-CM

## 2020-07-29 NOTE — Telephone Encounter (Signed)
Telephone call to patient for follow up after receiving first infusion.   Patient states infusion went great.  States eating good and drinking plenty of fluids.   Denies any nausea or vomiting.  Encouraged patient to call for any concerns or questions. 

## 2020-07-29 NOTE — Telephone Encounter (Signed)
Labs added per md order 

## 2020-07-29 NOTE — Telephone Encounter (Signed)
-----   Message from Cammie Sickle, MD sent at 07/26/2020 10:58 PM EDT ----- H/T- please order Thyroid profile at next visit in 3 weeks.  ---------------------------------------------------------------------------------- Hi- your thyroid labs are slightly; abnormal. We will repeat at next visit. GB

## 2020-08-05 ENCOUNTER — Other Ambulatory Visit: Payer: Self-pay

## 2020-08-05 ENCOUNTER — Inpatient Hospital Stay: Payer: Medicare PPO

## 2020-08-05 DIAGNOSIS — C3431 Malignant neoplasm of lower lobe, right bronchus or lung: Secondary | ICD-10-CM

## 2020-08-05 DIAGNOSIS — Z5112 Encounter for antineoplastic immunotherapy: Secondary | ICD-10-CM | POA: Diagnosis not present

## 2020-08-05 LAB — BASIC METABOLIC PANEL
Anion gap: 7 (ref 5–15)
BUN: 42 mg/dL — ABNORMAL HIGH (ref 8–23)
CO2: 28 mmol/L (ref 22–32)
Calcium: 8.5 mg/dL — ABNORMAL LOW (ref 8.9–10.3)
Chloride: 102 mmol/L (ref 98–111)
Creatinine, Ser: 1.27 mg/dL — ABNORMAL HIGH (ref 0.61–1.24)
GFR calc Af Amer: >60 mL/min (ref >60)
GFR calc non Af Amer: 54 mL/min — ABNORMAL LOW (ref >60)
Glucose, Bld: 194 mg/dL — ABNORMAL HIGH (ref 70–99)
Potassium: 5.6 mmol/L — ABNORMAL HIGH (ref 3.5–5.1)
Sodium: 137 mmol/L (ref 135–145)

## 2020-08-05 LAB — CBC WITH DIFFERENTIAL/PLATELET
Abs Immature Granulocytes: 0.01 10*3/uL (ref 0.00–0.07)
Basophils Absolute: 0 10*3/uL (ref 0.0–0.1)
Basophils Relative: 0 %
Eosinophils Absolute: 0 10*3/uL (ref 0.0–0.5)
Eosinophils Relative: 0 %
HCT: 39.9 % (ref 39.0–52.0)
Hemoglobin: 13.4 g/dL (ref 13.0–17.0)
Immature Granulocytes: 0 %
Lymphocytes Relative: 28 %
Lymphs Abs: 0.8 10*3/uL (ref 0.7–4.0)
MCH: 28.6 pg (ref 26.0–34.0)
MCHC: 33.6 g/dL (ref 30.0–36.0)
MCV: 85.3 fL (ref 80.0–100.0)
Monocytes Absolute: 0.2 10*3/uL (ref 0.1–1.0)
Monocytes Relative: 6 %
Neutro Abs: 1.8 10*3/uL (ref 1.7–7.7)
Neutrophils Relative %: 66 %
Platelets: 72 10*3/uL — ABNORMAL LOW (ref 150–400)
RBC: 4.68 MIL/uL (ref 4.22–5.81)
RDW: 12.7 % (ref 11.5–15.5)
WBC: 2.7 10*3/uL — ABNORMAL LOW (ref 4.0–10.5)
nRBC: 0 % (ref 0.0–0.2)

## 2020-08-06 ENCOUNTER — Encounter: Payer: Self-pay | Admitting: Internal Medicine

## 2020-08-06 ENCOUNTER — Telehealth: Payer: Self-pay | Admitting: *Deleted

## 2020-08-06 ENCOUNTER — Other Ambulatory Visit: Payer: Self-pay

## 2020-08-06 ENCOUNTER — Inpatient Hospital Stay: Payer: Medicare PPO

## 2020-08-06 DIAGNOSIS — E875 Hyperkalemia: Secondary | ICD-10-CM

## 2020-08-06 DIAGNOSIS — Z5112 Encounter for antineoplastic immunotherapy: Secondary | ICD-10-CM | POA: Diagnosis not present

## 2020-08-06 LAB — BASIC METABOLIC PANEL
Anion gap: 7 (ref 5–15)
BUN: 42 mg/dL — ABNORMAL HIGH (ref 8–23)
CO2: 25 mmol/L (ref 22–32)
Calcium: 8.3 mg/dL — ABNORMAL LOW (ref 8.9–10.3)
Chloride: 102 mmol/L (ref 98–111)
Creatinine, Ser: 1.08 mg/dL (ref 0.61–1.24)
GFR calc Af Amer: >60 mL/min (ref >60)
GFR calc non Af Amer: >60 mL/min (ref >60)
Glucose, Bld: 142 mg/dL — ABNORMAL HIGH (ref 70–99)
Potassium: 4.7 mmol/L (ref 3.5–5.1)
Sodium: 134 mmol/L — ABNORMAL LOW (ref 135–145)

## 2020-08-06 NOTE — Telephone Encounter (Signed)
Patient would like to know the timing of getting the Covid booster with his next chemotherapy treatment. Please let patient know. Thanks.

## 2020-08-06 NOTE — Telephone Encounter (Signed)
Per md - pt may proceed with covid vaccine booster - when available.

## 2020-08-06 NOTE — Telephone Encounter (Signed)
Voice mail msg left for patient. Requested pt to call back to schedule an additional lab apt today.

## 2020-08-06 NOTE — Telephone Encounter (Signed)
Spoke with Dr. Rogue Bussing. Repeat potassium 4.7-improved/better. Pt informed.

## 2020-08-12 ENCOUNTER — Ambulatory Visit: Payer: Self-pay | Attending: Internal Medicine

## 2020-08-12 DIAGNOSIS — Z23 Encounter for immunization: Secondary | ICD-10-CM

## 2020-08-12 NOTE — Progress Notes (Signed)
   Covid-19 Vaccination Clinic  Name:  Ralph Williams    MRN: [id number] DOB: 1941/10/13  08/12/2020  Ralph Williams was observed post Covid-19 immunization for 15 minutes without incident. He was provided with Vaccine Information Sheet and instruction to access the V-Safe system.   Ralph Williams was instructed to call 911 with any severe reactions post vaccine: Marland Kitchen Difficulty breathing  . Swelling of face and throat  . A fast heartbeat  . A bad rash all over body  . Dizziness and weakness

## 2020-08-15 ENCOUNTER — Other Ambulatory Visit: Payer: Self-pay

## 2020-08-15 DIAGNOSIS — C3431 Malignant neoplasm of lower lobe, right bronchus or lung: Secondary | ICD-10-CM

## 2020-08-15 DIAGNOSIS — E875 Hyperkalemia: Secondary | ICD-10-CM

## 2020-08-16 ENCOUNTER — Inpatient Hospital Stay: Payer: Medicare PPO

## 2020-08-16 ENCOUNTER — Inpatient Hospital Stay (HOSPITAL_BASED_OUTPATIENT_CLINIC_OR_DEPARTMENT_OTHER): Payer: Medicare PPO | Admitting: Internal Medicine

## 2020-08-16 ENCOUNTER — Encounter: Payer: Self-pay | Admitting: Internal Medicine

## 2020-08-16 ENCOUNTER — Other Ambulatory Visit: Payer: Self-pay

## 2020-08-16 VITALS — BP 152/73 | HR 55 | Resp 16

## 2020-08-16 DIAGNOSIS — Z95828 Presence of other vascular implants and grafts: Secondary | ICD-10-CM

## 2020-08-16 DIAGNOSIS — C3431 Malignant neoplasm of lower lobe, right bronchus or lung: Secondary | ICD-10-CM

## 2020-08-16 DIAGNOSIS — Z5112 Encounter for antineoplastic immunotherapy: Secondary | ICD-10-CM | POA: Diagnosis not present

## 2020-08-16 DIAGNOSIS — E875 Hyperkalemia: Secondary | ICD-10-CM

## 2020-08-16 LAB — CBC WITH DIFFERENTIAL/PLATELET
Abs Immature Granulocytes: 0.14 10*3/uL — ABNORMAL HIGH (ref 0.00–0.07)
Basophils Absolute: 0 10*3/uL (ref 0.0–0.1)
Basophils Relative: 0 %
Eosinophils Absolute: 0 10*3/uL (ref 0.0–0.5)
Eosinophils Relative: 0 %
HCT: 39 % (ref 39.0–52.0)
Hemoglobin: 13.3 g/dL (ref 13.0–17.0)
Immature Granulocytes: 4 %
Lymphocytes Relative: 34 %
Lymphs Abs: 1.1 10*3/uL (ref 0.7–4.0)
MCH: 29.4 pg (ref 26.0–34.0)
MCHC: 34.1 g/dL (ref 30.0–36.0)
MCV: 86.3 fL (ref 80.0–100.0)
Monocytes Absolute: 0.5 10*3/uL (ref 0.1–1.0)
Monocytes Relative: 15 %
Neutro Abs: 1.6 10*3/uL — ABNORMAL LOW (ref 1.7–7.7)
Neutrophils Relative %: 47 %
Platelets: 137 10*3/uL — ABNORMAL LOW (ref 150–400)
RBC: 4.52 MIL/uL (ref 4.22–5.81)
RDW: 14.3 % (ref 11.5–15.5)
WBC: 3.4 10*3/uL — ABNORMAL LOW (ref 4.0–10.5)
nRBC: 1.2 % — ABNORMAL HIGH (ref 0.0–0.2)

## 2020-08-16 LAB — COMPREHENSIVE METABOLIC PANEL
ALT: 39 U/L (ref 0–44)
AST: 18 U/L (ref 15–41)
Albumin: 3.3 g/dL — ABNORMAL LOW (ref 3.5–5.0)
Alkaline Phosphatase: 55 U/L (ref 38–126)
Anion gap: 9 (ref 5–15)
BUN: 49 mg/dL — ABNORMAL HIGH (ref 8–23)
CO2: 25 mmol/L (ref 22–32)
Calcium: 8.6 mg/dL — ABNORMAL LOW (ref 8.9–10.3)
Chloride: 102 mmol/L (ref 98–111)
Creatinine, Ser: 1.13 mg/dL (ref 0.61–1.24)
GFR calc Af Amer: >60 mL/min (ref >60)
GFR calc non Af Amer: >60 mL/min (ref >60)
Glucose, Bld: 176 mg/dL — ABNORMAL HIGH (ref 70–99)
Potassium: 4.6 mmol/L (ref 3.5–5.1)
Sodium: 136 mmol/L (ref 135–145)
Total Bilirubin: 0.9 mg/dL (ref 0.3–1.2)
Total Protein: 6 g/dL — ABNORMAL LOW (ref 6.5–8.1)

## 2020-08-16 MED ORDER — HEPARIN SOD (PORK) LOCK FLUSH 100 UNIT/ML IV SOLN
500.0000 [IU] | Freq: Once | INTRAVENOUS | Status: AC | PRN
  Administered 2020-08-16: 500 [IU]
  Filled 2020-08-16: qty 5

## 2020-08-16 MED ORDER — HEPARIN SOD (PORK) LOCK FLUSH 100 UNIT/ML IV SOLN
INTRAVENOUS | Status: AC
  Filled 2020-08-16: qty 5

## 2020-08-16 MED ORDER — SODIUM CHLORIDE 0.9 % IV SOLN
500.0000 mg/m2 | Freq: Once | INTRAVENOUS | Status: AC
  Administered 2020-08-16: 1100 mg via INTRAVENOUS
  Filled 2020-08-16: qty 40

## 2020-08-16 MED ORDER — SODIUM CHLORIDE 0.9 % IV SOLN
510.0000 mg | Freq: Once | INTRAVENOUS | Status: AC
  Administered 2020-08-16: 510 mg via INTRAVENOUS
  Filled 2020-08-16: qty 51

## 2020-08-16 MED ORDER — SODIUM CHLORIDE 0.9 % IV SOLN
200.0000 mg | Freq: Once | INTRAVENOUS | Status: AC
  Administered 2020-08-16: 200 mg via INTRAVENOUS
  Filled 2020-08-16: qty 8

## 2020-08-16 MED ORDER — DEXAMETHASONE 4 MG PO TABS: ORAL_TABLET | ORAL | 0 refills | Status: DC

## 2020-08-16 MED ORDER — SODIUM CHLORIDE 0.9 % IV SOLN
10.0000 mg | Freq: Once | INTRAVENOUS | Status: AC
  Administered 2020-08-16: 10 mg via INTRAVENOUS
  Filled 2020-08-16: qty 10

## 2020-08-16 MED ORDER — FOLIC ACID 1 MG PO TABS: 1.0000 mg | ORAL_TABLET | Freq: Every day | ORAL | 1 refills | Status: DC

## 2020-08-16 MED ORDER — SODIUM CHLORIDE 0.9 % IV SOLN
Freq: Once | INTRAVENOUS | Status: AC
  Filled 2020-08-16: qty 250

## 2020-08-16 MED ORDER — SODIUM CHLORIDE 0.9 % IV SOLN
150.0000 mg | Freq: Once | INTRAVENOUS | Status: AC
  Administered 2020-08-16: 150 mg via INTRAVENOUS
  Filled 2020-08-16: qty 150

## 2020-08-16 MED ORDER — PALONOSETRON HCL INJECTION 0.25 MG/5ML
0.2500 mg | Freq: Once | INTRAVENOUS | Status: AC
  Administered 2020-08-16: 0.25 mg via INTRAVENOUS
  Filled 2020-08-16: qty 5

## 2020-08-16 MED ORDER — SODIUM CHLORIDE 0.9% FLUSH
10.0000 mL | INTRAVENOUS | Status: DC | PRN
  Administered 2020-08-16: 10 mL via INTRAVENOUS
  Filled 2020-08-16: qty 10

## 2020-08-16 NOTE — Progress Notes (Signed)
[facility] CONSULT NOTE  Patient Care Team: Ezequiel Kayser, MD as PCP - General (Internal Medicine) Telford Nab, RN as Oncology Nurse Navigator  CHIEF COMPLAINTS/PURPOSE OF CONSULTATION: lung nodule/mass  #  Oncology History Overview Note  # July 2021- RLL- 9.4 cm x 7.5 cm x 6.7 cm lobulated low-attenuation heterogeneous lung mass within the posteromedial aspect of the right lower lobe, at the level of the right hilum; positive for hilar; mediastinal adenopathy; PET scan-July 2021 + for adrenal metastases; CT-guided biopsy-non-small cell favor adenocarcinoma; necrosis  # carbo-alimtac  [name]; NGS pending] cycle #2 carbo Alimta Keytruda.  # 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 CKD  # NGS/MOLECULAR TESTS: TPS =70%; rest **  # PALLIATIVE CARE EVALUATION:     DIAGNOSIS: Lung cancer  STAGE: 4       ;  GOALS: Palliative  CURRENT/MOST RECENT THERAPY : Carbo Alimta Keytruda    Cancer of lower lobe of right lung ([place])  07/05/2020 Initial Diagnosis   Cancer of lower lobe of right lung ([place])   07/26/2020 -  Chemotherapy   The patient had dexamethasone (DECADRON) 4 MG tablet, 1 of 1 cycle, Start date: --, End date: -- palonosetron (ALOXI) injection 0.25 mg, 0.25 mg, Intravenous,  Once, 1 of 4 cycles Administration: 0.25 mg (07/26/2020) PEMEtrexed (ALIMTA) 1,100 mg in sodium chloride 0.9 % 100 mL chemo infusion, 500 mg/m2 = 1,100 mg, Intravenous,  Once, 1 of 6 cycles Administration: 1,100 mg (07/26/2020) CARBOplatin (PARAPLATIN) 510 mg in sodium chloride 0.9 % 250 mL chemo infusion, 510 mg (100 % of original dose 513.5 mg), Intravenous,  Once, 1 of 4 cycles Dose modification:   (original dose 513.5 mg, Cycle 1) Administration: 510 mg (07/26/2020) fosaprepitant (EMEND) 150 mg in sodium chloride 0.9 % 145 mL IVPB, 150 mg, Intravenous,  Once, 1 of 4 cycles Administration: 150 mg  (07/26/2020) pembrolizumab (KEYTRUDA) 200 mg in sodium chloride 0.9 % 50 mL chemo infusion, 200 mg, Intravenous, Once, 0 of 5 cycles  for chemotherapy treatment.       HISTORY OF PRESENTING ILLNESS:  Ralph Williams 79 y.o.  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   Patient denies any nausea vomiting.  Appetite is too good.  Complains of insomnia.  Unfortunately, inadvertently patient has been taking dexamethasone 4 mg twice a day throughout 3 weeks.  His cough is improved.  Breathing is improved.   Review of Systems  Constitutional: Negative for chills, diaphoresis, fever, malaise/fatigue and weight loss.  HENT: Negative for nosebleeds and sore throat.   Eyes: Negative for double vision.  Respiratory: Negative for hemoptysis, sputum production and shortness of breath.   Cardiovascular: Negative for chest pain, palpitations, orthopnea and leg swelling.  Gastrointestinal: Negative for abdominal pain, blood in stool, constipation, diarrhea, heartburn, melena, nausea and vomiting.  Genitourinary: Negative for dysuria, frequency and urgency.  Musculoskeletal: Negative for back pain and joint pain.  Skin: Negative.  Negative for itching and rash.  Neurological: Negative for dizziness, tingling, focal weakness, weakness and headaches.  Endo/Heme/Allergies: Does not bruise/bleed easily.  Psychiatric/Behavioral: Negative for depression. The patient has insomnia. The patient is not nervous/anxious.      MEDICAL HISTORY:  Past Medical History:  Diagnosis Date  . Chronic renal insufficiency   . Diabetes mellitus without complication ([place])    Pt states it's diet controlled.  Marland Kitchen  GERD (gastroesophageal reflux disease)   . Hyperlipidemia associated with type 2 diabetes mellitus ([place])   . Hypertension   . Lung mass     SURGICAL HISTORY: Past Surgical History:  Procedure Laterality Date  . IR IMAGING GUIDED PORT  INSERTION  07/22/2020  . lymphoma removal     back    SOCIAL HISTORY: Social History   Socioeconomic History  . Marital status: Married    Spouse name: Not on file  . Number of children: Not on file  . Years of education: Not on file  . Highest education level: Not on file  Occupational History  . Not on file  Tobacco Use  . Smoking status: Former Smoker    Quit date: 07/23/2007    Years since quitting: 13.0  . Smokeless tobacco: Never Used  Vaping Use  . Vaping Use: Never used  Substance and Sexual Activity  . Alcohol use: Not on file    Comment: rarely  . Drug use: Never  . Sexual activity: Not on file  Other Topics Concern  . Not on file  Social History Narrative   Moved from [place]; lives in [place] in 2021; daughter lives next door. Quit smoking- 13 years ago. Social alcohol. 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    Social Determinants of Health   Financial Resource Strain:   . Difficulty of Paying Living Expenses: Not on file  Food Insecurity:   . Worried About Charity fundraiser in the Last Year: Not on file  . Ran Out of Food in the Last Year: Not on file  Transportation Needs:   . Lack of Transportation (Medical): Not on file  . Lack of Transportation (Non-Medical): Not on file  Physical Activity:   . Days of Exercise per Week: Not on file  . Minutes of Exercise per Session: Not on file  Stress:   . Feeling of Stress : Not on file  Social Connections:   . Frequency of Communication with Friends and Family: Not on file  . Frequency of Social Gatherings with Friends and Family: Not on file  . Attends Religious Services: Not on file  . Active Member of Clubs or Organizations: Not on file  . Attends Archivist Meetings: Not on file  . Marital Status: Not on file  Intimate Partner Violence:   . Fear of Current or Ex-Partner: Not on file  . Emotionally Abused: Not on file  . Physically Abused: Not on file  . Sexually  Abused: Not on file    FAMILY HISTORY: Family History  Problem Relation Age of Onset  . Liver cancer Sister   . Cancer - Other Brother        bone cancer [2001]    ALLERGIES:  is allergic to niacin and related.  MEDICATIONS:  Current Outpatient Medications  Medication Sig Dispense Refill  . acetaminophen (TYLENOL) 500 MG tablet Take 2 tablets by mouth every 8 (eight) hours.     Marland Kitchen albuterol (VENTOLIN HFA) 108 (90 Base) MCG/ACT inhaler Inhale 2 puffs into the lungs in the morning, at noon, in the evening, and at bedtime.    Marland Kitchen aspirin 325 MG EC tablet Take 1 tablet by mouth daily.    Marland Kitchen atenolol (TENORMIN) 25 MG tablet Take 1 tablet by mouth daily.    Marland Kitchen atorvastatin (LIPITOR) 40 MG tablet Take 1 tablet by mouth daily.    Marland Kitchen dexamethasone (DECADRON) 4 MG tablet Take one pill AM & PM; TAKE the day  before & day after chemo; DO NOT Take on the day of chemo. ONLY for 2 days. 60 tablet 0  . folic acid (FOLVITE) 1 MG tablet Take 1 tablet (1 mg total) by mouth daily. 90 tablet 1  . hydrochlorothiazide (HYDRODIURIL) 12.5 MG tablet Take 12.5 mg by mouth daily.    Marland Kitchen lidocaine-prilocaine (EMLA) cream Apply 1 application topically as needed. 30 g 0  . losartan (COZAAR) 50 MG tablet Take 1 tablet by mouth daily.    . Melatonin 10 MG TABS Take 1 tablet by mouth at bedtime.    . Omega-3 1000 MG CAPS Take 1,000 mg by mouth in the morning and at bedtime.    Marland Kitchen omeprazole (PRILOSEC) 20 MG capsule Take 20 mg by mouth in the morning and at bedtime.    . ondansetron (ZOFRAN) 8 MG tablet One pill every 8 hours as needed for nausea/vomitting. 40 tablet 1  . prochlorperazine (COMPAZINE) 10 MG tablet Take 1 tablet (10 mg total) by mouth every 6 (six) hours as needed for nausea or vomiting. 40 tablet 1  . tadalafil (CIALIS) 20 MG tablet Take 1 tablet by mouth daily.    . Zinc 22.5 MG TABS Take 22 mg by mouth daily.     No current facility-administered medications for this visit.   Facility-Administered  Medications Ordered in Other Visits  Medication Dose Route Frequency Provider Last Rate Last Admin  . sodium chloride flush (NS) 0.9 % injection 10 mL  10 mL Intravenous PRN Cammie Sickle, MD   10 mL at 08/16/20 0805      .  PHYSICAL EXAMINATION: ECOG PERFORMANCE STATUS: 1 - Symptomatic but completely ambulatory  Vitals:   08/16/20 0826  BP: (!) 139/54  Pulse: (!) 55  Resp: 16  Temp: (!) 95.1 F (35.1 C)  SpO2: 99%   Filed Weights   08/16/20 0826  Weight: 231 lb (104.8 kg)    Physical Exam Constitutional:      Comments: Ambulating independently.  Accompanied by his son.   HENT:     Head: Normocephalic and atraumatic.     Mouth/Throat:     Pharynx: No oropharyngeal exudate.  Eyes:     Pupils: Pupils are equal, round, and reactive to light.  Cardiovascular:     Rate and Rhythm: Normal rate and regular rhythm.  Pulmonary:     Effort: Pulmonary effort is normal. No respiratory distress.     Breath sounds: Normal breath sounds. No wheezing.  Abdominal:     General: Bowel sounds are normal. There is no distension.     Palpations: Abdomen is soft. There is no mass.     Tenderness: There is no abdominal tenderness. There is no guarding or rebound.  Musculoskeletal:        General: No tenderness. Normal range of motion.     Cervical back: Normal range of motion and neck supple.  Skin:    General: Skin is warm.  Neurological:     Mental Status: He is alert and oriented to person, place, and time.  Psychiatric:        Mood and Affect: Affect normal.      LABORATORY DATA:  I have reviewed the data as listed Lab Results  Component Value Date   WBC 3.4 (L) 08/16/2020   HGB 13.3 08/16/2020   HCT 39.0 08/16/2020   MCV 86.3 08/16/2020   PLT 137 (L) 08/16/2020   Recent Labs    07/05/20 1002 07/05/20 1002 07/26/20 0816 07/26/20  7445 08/05/20 1017 08/06/20 1333 08/16/20 0802  NA 136   < > 136   < > 137 134* 136  K 4.0   < > 4.4   < > 5.6* 4.7 4.6  CL  103   < > 105   < > 102 102 102  CO2 23   < > 22   < > _0 GLUCOSE 136*   < > 201*   < > 194* 142* 176*  BUN 26*   < > 33*   < > 42* 42* 49*  CREATININE 1.16   < > 1.16   < > 1.27* 1.08 1.13  CALCIUM 8.7*   < > 8.5*   < > 8.5* 8.3* 8.6*  GFRNONAA >60   < > >60   < > 54* >60 >60  GFRAA >60   < > >60   < > >60 >60 >60  PROT 7.4  --  7.2  --   --   --  6.0*  ALBUMIN 3.5  --  3.3*  --   --   --  3.3*  AST 18  --  18  --   --   --  18  ALT 34  --  26  --   --   --  39  ALKPHOS 67  --  73  --   --   --  55  BILITOT 0.6  --  0.6  --   --   --  0.9   < > = values in this interval not displayed.    RADIOGRAPHIC STUDIES: I have personally reviewed the radiological images as listed and agreed with the findings in the report. MR Brain W Wo Contrast  Result Date: 07/21/2020 CLINICAL DATA:  Malignant neoplasm of unspecified part of unspecified bronchus or lung. Non-small cell lung cancer, staging. EXAM: MRI HEAD WITHOUT AND WITH CONTRAST TECHNIQUE: Multiplanar, multiecho pulse sequences of the brain and surrounding structures were obtained without and with intravenous contrast. CONTRAST:  10 mL Gadavist intravenous contrast. COMPARISON:  No prior imaging of the head is available for comparison., prior PET-CT 07/11/2020. FINDINGS: Brain: Mild generalized parenchymal atrophy. Mild scattered T2/FLAIR hyperintensity within the cerebral white matter is nonspecific, but consistent with chronic small vessel ischemic disease. No abnormal enhance is demonstrated to suggest intracranial metastatic disease. There is no acute infarct. No evidence of intracranial mass. No chronic intracranial blood products. No extra-axial fluid collection. No midline shift. Vascular: Expected proximal arterial flow voids. Skull and upper cervical spine: Indeterminate 4 mm T1 hypointense lesion within the dens (series 9, image 13). No focal calvarial marrow lesion is identified. Sinuses/Orbits: Visualized orbits show no acute  finding. Mild ethmoid sinus mucosal thickening. Bilateral maxillary sinus mucous retention cysts. No significant mastoid effusion. IMPRESSION: No evidence of intracranial metastatic disease. Indeterminate 4 mm well-circumscribed lesion within the dens. Attention recommended on follow-up. Mild generalized parenchymal atrophy and chronic small vessel ischemic changes. Mild ethmoid sinus mucosal thickening. Bilateral maxillary sinus mucous retention cysts. Electronically Signed   By: Kellie Simmering DO   On: 07/21/2020 17:18   IR IMAGING GUIDED PORT INSERTION  Result Date: 07/22/2020 INDICATION: History of metastatic lung cancer. Patient presents today for [facility] a catheter placement for durable intravenous access for chemotherapy administration. EXAM: IMPLANTED PORT A CATH PLACEMENT WITH ULTRASOUND AND FLUOROSCOPIC GUIDANCE COMPARISON:  PET-CT-07/16/2020 MEDICATIONS: Ancef 2 gm IV; The antibiotic was administered within an appropriate time interval prior to skin puncture. ANESTHESIA/SEDATION: Moderate (conscious) sedation  was employed during this procedure. A total of Versed 1 mg and Fentanyl 50 mcg was administered intravenously. Moderate Sedation Time: 25 minutes. The patient's level of consciousness and vital signs were monitored continuously by radiology nursing throughout the procedure under my direct supervision. CONTRAST:  None FLUOROSCOPY TIME:  30 seconds (5.5 mGy) COMPLICATIONS: None immediate. PROCEDURE: The procedure, risks, benefits, and alternatives were explained to the patient. Questions regarding the procedure were encouraged and answered. The patient understands and consents to the procedure. The right neck and chest were prepped with chlorhexidine in a sterile fashion, and a sterile drape was applied covering the operative field. Maximum barrier sterile technique with sterile gowns and gloves were used for the procedure. A timeout was performed prior to the initiation of the procedure. Local  anesthesia was provided with 1% lidocaine with epinephrine. After creating a small venotomy incision, a micropuncture kit was utilized to access the internal jugular vein. Real-time ultrasound guidance was utilized for vascular access including the acquisition of a permanent ultrasound image documenting patency of the accessed vessel. The microwire was utilized to measure appropriate catheter length. A subcutaneous port pocket was then created along the upper chest wall utilizing a combination of sharp and blunt dissection. The pocket was irrigated with sterile saline. A single lumen "standard sized" power injectable port was chosen for placement. The 8 Fr catheter was tunneled from the port pocket site to the venotomy incision. The port was placed in the pocket. The external catheter was trimmed to appropriate length. At the venotomy, an 8 Fr peel-away sheath was placed over a guidewire under fluoroscopic guidance. The catheter was then placed through the sheath and the sheath was removed. Final catheter positioning was confirmed and documented with a fluoroscopic spot radiograph. The port was accessed with a Huber needle, aspirated and flushed with heparinized saline. The venotomy site was closed with an interrupted 4-0 Vicryl suture. The port pocket incision was closed with interrupted 2-0 Vicryl suture. Dermabond and Steri-strips were applied to both incisions. Dressings were applied. The patient tolerated the procedure well without immediate post procedural complication. FINDINGS: After catheter placement, the tip lies within the superior cavoatrial junction. The catheter aspirates and flushes normally and is ready for immediate use. IMPRESSION: Successful placement of a right internal jugular approach power injectable Port-A-Cath. The catheter is ready for immediate use. Electronically Signed   By: Sandi Mariscal M.D.   On: 07/22/2020 11:52    ASSESSMENT & PLAN:   Cancer of lower lobe of right lung ([place]) #  Lung cancer-non-small cell favor adeno ca; Stage IV RLL mass- ~9 cm; with hilar/mediastinal adenopathy; with left adrenal metastases.; PD-1- 70%.   # Proceed with carboplatin and Alimta + keytruda cycle #2Labs today reviewed;  acceptable for treatment today. Plan 4-5 induction combination  plan maintenance therapy with Keytruda-indefinite until intolerance/progression.  #Insomnia increased appetite-likely secondary to dexamethasone.  Patient inadvertently taking dexamethasone twice a day throughout the week.  Reminded the patient of the instructions.  # DISPOSITION: # carbo- alimta today- Beryle Flock. # 10 days- labs- cbc/bmp # 3 week-MD; labs- cbc/cmp; carbo-alimta-keytruda- Dr.B    All questions were answered. The patient knows to call the clinic with any problems, questions or concerns.    Cammie Sickle, MD 08/16/2020 9:07 AM

## 2020-08-16 NOTE — Assessment & Plan Note (Addendum)
#   Lung cancer-non-small cell favor adeno ca; Stage IV RLL mass- ~9 cm; with hilar/mediastinal adenopathy; with left adrenal metastases.; PD-1- 70%.   # Proceed with carboplatin and Alimta + keytruda cycle #2Labs today reviewed;  acceptable for treatment today. Plan 4-5 induction combination  plan maintenance therapy with Keytruda-indefinite until intolerance/progression.  #Insomnia increased appetite-likely secondary to dexamethasone.  Patient inadvertently taking dexamethasone twice a day throughout the week.  Reminded the patient of the instructions.  # DISPOSITION: # carbo- alimta today- Beryle Flock. # 10 days- labs- cbc/bmp # 3 week-MD; labs- cbc/cmp; carbo-alimta-keytruda- Dr.B

## 2020-08-17 LAB — THYROID PANEL WITH TSH
Free Thyroxine Index: 1.5 (ref 1.2–4.9)
T3 Uptake Ratio: 32 % (ref 24–39)
T4, Total: 4.8 ug/dL (ref 4.5–12.0)
TSH: 0.244 u[IU]/mL — ABNORMAL LOW (ref 0.450–4.500)

## 2020-08-27 ENCOUNTER — Other Ambulatory Visit: Payer: Self-pay

## 2020-08-27 ENCOUNTER — Inpatient Hospital Stay: Payer: Medicare PPO | Attending: Internal Medicine

## 2020-08-27 DIAGNOSIS — Z515 Encounter for palliative care: Secondary | ICD-10-CM | POA: Diagnosis not present

## 2020-08-27 DIAGNOSIS — C3431 Malignant neoplasm of lower lobe, right bronchus or lung: Secondary | ICD-10-CM | POA: Insufficient documentation

## 2020-08-27 DIAGNOSIS — K219 Gastro-esophageal reflux disease without esophagitis: Secondary | ICD-10-CM | POA: Diagnosis not present

## 2020-08-27 DIAGNOSIS — N183 Chronic kidney disease, stage 3 unspecified: Secondary | ICD-10-CM | POA: Insufficient documentation

## 2020-08-27 DIAGNOSIS — I2510 Atherosclerotic heart disease of native coronary artery without angina pectoris: Secondary | ICD-10-CM | POA: Insufficient documentation

## 2020-08-27 DIAGNOSIS — Z5111 Encounter for antineoplastic chemotherapy: Secondary | ICD-10-CM | POA: Insufficient documentation

## 2020-08-27 DIAGNOSIS — E1122 Type 2 diabetes mellitus with diabetic chronic kidney disease: Secondary | ICD-10-CM | POA: Diagnosis not present

## 2020-08-27 DIAGNOSIS — Z7982 Long term (current) use of aspirin: Secondary | ICD-10-CM | POA: Diagnosis not present

## 2020-08-27 DIAGNOSIS — R21 Rash and other nonspecific skin eruption: Secondary | ICD-10-CM | POA: Insufficient documentation

## 2020-08-27 DIAGNOSIS — C7970 Secondary malignant neoplasm of unspecified adrenal gland: Secondary | ICD-10-CM | POA: Diagnosis not present

## 2020-08-27 DIAGNOSIS — Z79899 Other long term (current) drug therapy: Secondary | ICD-10-CM | POA: Insufficient documentation

## 2020-08-27 DIAGNOSIS — Z87891 Personal history of nicotine dependence: Secondary | ICD-10-CM | POA: Insufficient documentation

## 2020-08-27 DIAGNOSIS — C771 Secondary and unspecified malignant neoplasm of intrathoracic lymph nodes: Secondary | ICD-10-CM | POA: Diagnosis not present

## 2020-08-27 DIAGNOSIS — I129 Hypertensive chronic kidney disease with stage 1 through stage 4 chronic kidney disease, or unspecified chronic kidney disease: Secondary | ICD-10-CM | POA: Diagnosis not present

## 2020-08-27 LAB — CBC WITH DIFFERENTIAL/PLATELET
Abs Immature Granulocytes: 0 10*3/uL (ref 0.00–0.07)
Basophils Absolute: 0 10*3/uL (ref 0.0–0.1)
Basophils Relative: 0 %
Eosinophils Absolute: 0 10*3/uL (ref 0.0–0.5)
Eosinophils Relative: 0 %
HCT: 34.5 % — ABNORMAL LOW (ref 39.0–52.0)
Hemoglobin: 12 g/dL — ABNORMAL LOW (ref 13.0–17.0)
Immature Granulocytes: 0 %
Lymphocytes Relative: 50 %
Lymphs Abs: 1.3 10*3/uL (ref 0.7–4.0)
MCH: 29.6 pg (ref 26.0–34.0)
MCHC: 34.8 g/dL (ref 30.0–36.0)
MCV: 85 fL (ref 80.0–100.0)
Monocytes Absolute: 0.2 10*3/uL (ref 0.1–1.0)
Monocytes Relative: 8 %
Neutro Abs: 1.1 10*3/uL — ABNORMAL LOW (ref 1.7–7.7)
Neutrophils Relative %: 42 %
Platelets: 41 10*3/uL — ABNORMAL LOW (ref 150–400)
RBC: 4.06 MIL/uL — ABNORMAL LOW (ref 4.22–5.81)
RDW: 13.9 % (ref 11.5–15.5)
WBC: 2.5 10*3/uL — ABNORMAL LOW (ref 4.0–10.5)
nRBC: 0 % (ref 0.0–0.2)

## 2020-08-27 LAB — BASIC METABOLIC PANEL
Anion gap: 8 (ref 5–15)
BUN: 36 mg/dL — ABNORMAL HIGH (ref 8–23)
CO2: 27 mmol/L (ref 22–32)
Calcium: 8.1 mg/dL — ABNORMAL LOW (ref 8.9–10.3)
Chloride: 99 mmol/L (ref 98–111)
Creatinine, Ser: 1.3 mg/dL — ABNORMAL HIGH (ref 0.61–1.24)
GFR calc Af Amer: >60 mL/min (ref >60)
GFR calc non Af Amer: 53 mL/min — ABNORMAL LOW (ref >60)
Glucose, Bld: 115 mg/dL — ABNORMAL HIGH (ref 70–99)
Potassium: 4.4 mmol/L (ref 3.5–5.1)
Sodium: 134 mmol/L — ABNORMAL LOW (ref 135–145)

## 2020-09-06 ENCOUNTER — Other Ambulatory Visit: Payer: Self-pay

## 2020-09-06 ENCOUNTER — Inpatient Hospital Stay: Payer: Medicare PPO

## 2020-09-06 ENCOUNTER — Inpatient Hospital Stay (HOSPITAL_BASED_OUTPATIENT_CLINIC_OR_DEPARTMENT_OTHER): Payer: Medicare PPO | Admitting: Internal Medicine

## 2020-09-06 ENCOUNTER — Inpatient Hospital Stay (HOSPITAL_BASED_OUTPATIENT_CLINIC_OR_DEPARTMENT_OTHER): Payer: Medicare PPO | Admitting: Hospice and Palliative Medicine

## 2020-09-06 ENCOUNTER — Encounter: Payer: Self-pay | Admitting: Internal Medicine

## 2020-09-06 VITALS — BP 130/64 | HR 71 | Temp 97.6°F | Resp 18 | Ht 67.5 in | Wt 231.0 lb

## 2020-09-06 DIAGNOSIS — C3431 Malignant neoplasm of lower lobe, right bronchus or lung: Secondary | ICD-10-CM | POA: Diagnosis not present

## 2020-09-06 DIAGNOSIS — Z515 Encounter for palliative care: Secondary | ICD-10-CM | POA: Diagnosis not present

## 2020-09-06 DIAGNOSIS — Z5111 Encounter for antineoplastic chemotherapy: Secondary | ICD-10-CM | POA: Diagnosis not present

## 2020-09-06 LAB — CBC WITH DIFFERENTIAL/PLATELET
Abs Immature Granulocytes: 0.06 10*3/uL (ref 0.00–0.07)
Basophils Absolute: 0 10*3/uL (ref 0.0–0.1)
Basophils Relative: 0 %
Eosinophils Absolute: 0 10*3/uL (ref 0.0–0.5)
Eosinophils Relative: 0 %
HCT: 30.4 % — ABNORMAL LOW (ref 39.0–52.0)
Hemoglobin: 10.3 g/dL — ABNORMAL LOW (ref 13.0–17.0)
Immature Granulocytes: 2 %
Lymphocytes Relative: 40 %
Lymphs Abs: 1.3 10*3/uL (ref 0.7–4.0)
MCH: 29.6 pg (ref 26.0–34.0)
MCHC: 33.9 g/dL (ref 30.0–36.0)
MCV: 87.4 fL (ref 80.0–100.0)
Monocytes Absolute: 0.4 10*3/uL (ref 0.1–1.0)
Monocytes Relative: 10 %
Neutro Abs: 1.6 10*3/uL — ABNORMAL LOW (ref 1.7–7.7)
Neutrophils Relative %: 48 %
Platelets: 232 10*3/uL (ref 150–400)
RBC: 3.48 MIL/uL — ABNORMAL LOW (ref 4.22–5.81)
RDW: 15.3 % (ref 11.5–15.5)
WBC: 3.4 10*3/uL — ABNORMAL LOW (ref 4.0–10.5)
nRBC: 0.6 % — ABNORMAL HIGH (ref 0.0–0.2)

## 2020-09-06 LAB — COMPREHENSIVE METABOLIC PANEL
ALT: 117 U/L — ABNORMAL HIGH (ref 0–44)
AST: 39 U/L (ref 15–41)
Albumin: 3.2 g/dL — ABNORMAL LOW (ref 3.5–5.0)
Alkaline Phosphatase: 67 U/L (ref 38–126)
Anion gap: 11 (ref 5–15)
BUN: 31 mg/dL — ABNORMAL HIGH (ref 8–23)
CO2: 22 mmol/L (ref 22–32)
Calcium: 8.5 mg/dL — ABNORMAL LOW (ref 8.9–10.3)
Chloride: 106 mmol/L (ref 98–111)
Creatinine, Ser: 1.37 mg/dL — ABNORMAL HIGH (ref 0.61–1.24)
GFR calc Af Amer: 57 mL/min — ABNORMAL LOW (ref >60)
GFR calc non Af Amer: 49 mL/min — ABNORMAL LOW (ref >60)
Glucose, Bld: 210 mg/dL — ABNORMAL HIGH (ref 70–99)
Potassium: 4.7 mmol/L (ref 3.5–5.1)
Sodium: 139 mmol/L (ref 135–145)
Total Bilirubin: 0.8 mg/dL (ref 0.3–1.2)
Total Protein: 6.5 g/dL (ref 6.5–8.1)

## 2020-09-06 MED ORDER — HEPARIN SOD (PORK) LOCK FLUSH 100 UNIT/ML IV SOLN
INTRAVENOUS | Status: AC
  Filled 2020-09-06: qty 5

## 2020-09-06 MED ORDER — SODIUM CHLORIDE 0.9 % IV SOLN
10.0000 mg | Freq: Once | INTRAVENOUS | Status: AC
  Administered 2020-09-06: 10 mg via INTRAVENOUS
  Filled 2020-09-06: qty 10

## 2020-09-06 MED ORDER — HEPARIN SOD (PORK) LOCK FLUSH 100 UNIT/ML IV SOLN
500.0000 [IU] | Freq: Once | INTRAVENOUS | Status: AC
  Administered 2020-09-06: 500 [IU] via INTRAVENOUS
  Filled 2020-09-06: qty 5

## 2020-09-06 MED ORDER — PALONOSETRON HCL INJECTION 0.25 MG/5ML
0.2500 mg | Freq: Once | INTRAVENOUS | Status: AC
  Administered 2020-09-06: 0.25 mg via INTRAVENOUS
  Filled 2020-09-06: qty 5

## 2020-09-06 MED ORDER — SODIUM CHLORIDE 0.9 % IV SOLN
500.0000 mg/m2 | Freq: Once | INTRAVENOUS | Status: AC
  Administered 2020-09-06: 1100 mg via INTRAVENOUS
  Filled 2020-09-06: qty 40

## 2020-09-06 MED ORDER — SODIUM CHLORIDE 0.9 % IV SOLN
Freq: Once | INTRAVENOUS | Status: AC
  Filled 2020-09-06: qty 250

## 2020-09-06 MED ORDER — CYANOCOBALAMIN 1000 MCG/ML IJ SOLN
1000.0000 ug | Freq: Once | INTRAMUSCULAR | Status: AC
  Administered 2020-09-06: 1000 ug via INTRAMUSCULAR
  Filled 2020-09-06: qty 1

## 2020-09-06 MED ORDER — SODIUM CHLORIDE 0.9 % IV SOLN
150.0000 mg | Freq: Once | INTRAVENOUS | Status: AC
  Administered 2020-09-06: 150 mg via INTRAVENOUS
  Filled 2020-09-06: qty 150

## 2020-09-06 MED ORDER — SODIUM CHLORIDE 0.9 % IV SOLN
454.0000 mg | Freq: Once | INTRAVENOUS | Status: AC
  Administered 2020-09-06: 450 mg via INTRAVENOUS
  Filled 2020-09-06: qty 45

## 2020-09-06 MED ORDER — SODIUM CHLORIDE 0.9% FLUSH
10.0000 mL | Freq: Once | INTRAVENOUS | Status: AC
  Administered 2020-09-06: 10 mL via INTRAVENOUS
  Filled 2020-09-06: qty 10

## 2020-09-06 MED ORDER — SODIUM CHLORIDE 0.9 % IV SOLN
200.0000 mg | Freq: Once | INTRAVENOUS | Status: AC
  Administered 2020-09-06: 200 mg via INTRAVENOUS
  Filled 2020-09-06: qty 8

## 2020-09-06 NOTE — Patient Instructions (Signed)
Take ca+vit D 500 mg BID.

## 2020-09-06 NOTE — Progress Notes (Signed)
[place]  [phone] [phone]   Name: Ralph Williams Date: 09/06/2020 MRN: [id number]  DOB: [birth year]  Patient Care Team: Ralph Kayser, MD as PCP - General (Internal Medicine) Ralph Nab, RN as Oncology Nurse Navigator    REASON FOR CONSULTATION: Ralph Williams is a 79 y.o. male with multiple medical problems including stage IV non-small cell lung cancer diagnosed July 2021 and was on systemic chemotherapy.  Palliative care was referred to discuss goals and support patient for treatment.  SOCIAL HISTORY:     reports that he quit smoking about 13 years ago. He has never used smokeless tobacco. He reports that he does not use drugs.   Patient is married and lives at home with his wife. Patient moved to [place] from [facility] to live near his daughter, who now lives next door.  Patient has 4 children.  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  ADVANCE DIRECTIVES:  Not on file  CODE STATUS:   PAST MEDICAL HISTORY: Past Medical History:  Diagnosis Date   Chronic renal insufficiency    Diabetes mellitus without complication ([place])    Pt states it's diet controlled.   GERD (gastroesophageal reflux disease)    Hyperlipidemia associated with type 2 diabetes mellitus ([place])    Hypertension    Lung mass     PAST SURGICAL HISTORY:  Past Surgical History:  Procedure Laterality Date   IR IMAGING GUIDED PORT INSERTION  07/22/2020   lymphoma removal     back    HEMATOLOGY/ONCOLOGY HISTORY:  Oncology History Overview Note  # July 2021- RLL- 9.4 cm x 7.5 cm x 6.7 cm lobulated low-attenuation heterogeneous lung mass within the posteromedial aspect of the right lower lobe, at the level of the right hilum; positive for hilar; mediastinal adenopathy; PET scan-July 2021 + for adrenal metastases; CT-guided biopsy-non-small cell favor adenocarcinoma; necrosis  # carbo-alimtac   Ralph Williams; NGS pending] cycle #2 carbo Alimta Keytruda.  # 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 CKD  # NGS/MOLECULAR TESTS: TPS =70%; rest **  # PALLIATIVE CARE EVALUATION:     DIAGNOSIS: Lung cancer  STAGE: 4       ;  GOALS: Palliative  CURRENT/MOST RECENT THERAPY : Carbo Alimta Keytruda    Cancer of lower lobe of right lung ([place])  07/05/2020 Initial Diagnosis   Cancer of lower lobe of right lung ([place])   07/26/2020 -  Chemotherapy   The patient had dexamethasone (DECADRON) 4 MG tablet, 1 of 1 cycle, Start date: --, End date: -- palonosetron (ALOXI) injection 0.25 mg, 0.25 mg, Intravenous,  Once, 3 of 4 cycles Administration: 0.25 mg (07/26/2020), 0.25 mg (09/06/2020), 0.25 mg (08/16/2020) PEMEtrexed (ALIMTA) 1,100 mg in sodium chloride 0.9 % 100 mL chemo infusion, 500 mg/m2 = 1,100 mg, Intravenous,  Once, 3 of 6 cycles Administration: 1,100 mg (07/26/2020), 1,100 mg (09/06/2020), 1,100 mg (08/16/2020) CARBOplatin (PARAPLATIN) 510 mg in sodium chloride 0.9 % 250 mL chemo infusion, 510 mg (100 % of original dose 513.5 mg), Intravenous,  Once, 3 of 4 cycles Dose modification:   (original dose 513.5 mg, Cycle 1) Administration: 510 mg (07/26/2020), 450 mg (09/06/2020), 510 mg (08/16/2020) fosaprepitant (EMEND) 150 mg in sodium chloride 0.9 % 145 mL IVPB, 150 mg, Intravenous,  Once, 3 of 4 cycles Administration: 150 mg (07/26/2020), 150 mg (09/06/2020), 150 mg (08/16/2020) pembrolizumab (KEYTRUDA) 200  mg in sodium chloride 0.9 % 50 mL chemo infusion, 200 mg, Intravenous, Once, 2 of 5 cycles Administration: 200 mg (09/06/2020), 200 mg (08/16/2020)  for chemotherapy treatment.      ALLERGIES:  is allergic to niacin and related.  MEDICATIONS:  Current Outpatient Medications  Medication Sig Dispense Refill   acetaminophen (TYLENOL) 500 MG tablet Take 2 tablets by mouth every 8 (eight) hours.      albuterol (VENTOLIN  HFA) 108 (90 Base) MCG/ACT inhaler Inhale 2 puffs into the lungs in the morning, at noon, in the evening, and at bedtime.     aspirin 325 MG EC tablet Take 1 tablet by mouth daily.     atenolol (TENORMIN) 25 MG tablet Take 1 tablet by mouth daily.     atorvastatin (LIPITOR) 40 MG tablet Take 1 tablet by mouth daily.     dexamethasone (DECADRON) 4 MG tablet Take one pill AM & PM; TAKE the day before & day after chemo; DO NOT Take on the day of chemo. ONLY for 2 days. 60 tablet 0   folic acid (FOLVITE) 1 MG tablet Take 1 tablet (1 mg total) by mouth daily. 90 tablet 1   hydrochlorothiazide (HYDRODIURIL) 12.5 MG tablet Take 12.5 mg by mouth daily.     lidocaine-prilocaine (EMLA) cream Apply 1 application topically as needed. 30 g 0   losartan (COZAAR) 50 MG tablet Take 1 tablet by mouth daily.     Melatonin 10 MG TABS Take 1 tablet by mouth at bedtime.     Omega-3 1000 MG CAPS Take 1,000 mg by mouth in the morning and at bedtime.     omeprazole (PRILOSEC) 20 MG capsule Take 20 mg by mouth in the morning and at bedtime.     ondansetron (ZOFRAN) 8 MG tablet One pill every 8 hours as needed for nausea/vomitting. 40 tablet 1   prochlorperazine (COMPAZINE) 10 MG tablet Take 1 tablet (10 mg total) by mouth every 6 (six) hours as needed for nausea or vomiting. 40 tablet 1   tadalafil (CIALIS) 20 MG tablet Take 1 tablet by mouth daily.     Zinc 22.5 MG TABS Take 22 mg by mouth daily.     No current facility-administered medications for this visit.    VITAL SIGNS: There were no vitals taken for this visit. There were no vitals filed for this visit.  Estimated body mass index is 35.65 kg/m as calculated from the following:   Height as of an earlier encounter on 09/06/20: 5' 7.5" (1.715 m).   Weight as of an earlier encounter on 09/06/20: 231 lb (104.8 kg).  LABS: CBC:    Component Value Date/Time   WBC 3.4 (L) 09/06/2020 0806   HGB 10.3 (L) 09/06/2020 0806   HCT 30.4 (L) 09/06/2020  0806   PLT 232 09/06/2020 0806   MCV 87.4 09/06/2020 0806   NEUTROABS 1.6 (L) 09/06/2020 0806   LYMPHSABS 1.3 09/06/2020 0806   MONOABS 0.4 09/06/2020 0806   EOSABS 0.0 09/06/2020 0806   BASOSABS 0.0 09/06/2020 0806   Comprehensive Metabolic Panel:    Component Value Date/Time   NA 139 09/06/2020 0806   K 4.7 09/06/2020 0806   CL 106 09/06/2020 0806   CO2 22 09/06/2020 0806   BUN 31 (H) 09/06/2020 0806   CREATININE 1.37 (H) 09/06/2020 0806   GLUCOSE 210 (H) 09/06/2020 0806   CALCIUM 8.5 (L) 09/06/2020 0806   AST 39 09/06/2020 0806   ALT 117 (H) 09/06/2020 5449  ALKPHOS 67 09/06/2020 0806   BILITOT 0.8 09/06/2020 0806   PROT 6.5 09/06/2020 0806   ALBUMIN 3.2 (L) 09/06/2020 9371    RADIOGRAPHIC STUDIES: No results found.  PERFORMANCE STATUS (ECOG) : 0 - Asymptomatic  Review of Systems Unless otherwise noted, a complete review of systems is negative.  Physical Exam General: NAD Pulmonary: Unlabored Extremities: no edema, no joint deformities Skin: no rashes Neurological:  nonfocal  IMPRESSION: Routine follow-up visit.  Patient was seen in infusion area.  Patient reports he is doing well.  He denies any significant concerns or changes today.  No symptomatic complaints.  Patient reports that he is eating well.  Performance status is stable.  No issues with medications.  Patient says that he located is ACP documents and will bring those into the clinic when next seen.  Patient states that he remains optimistic and is trying to have a positive outlook.    PLAN: -Continue current scope of treatment -Patient to bring in ACP documents -MOST Form previously reviewed -RTC in 3 to 4 weeks  Patient expressed understanding and was in agreement with this plan. He also understands that He can call the clinic at any time with any questions, concerns, or complaints.     Time Total: 15 minutes  Visit consisted of counseling and education dealing with the complex and  emotionally intense issues of symptom management and palliative care in the setting of serious and potentially life-threatening illness.Greater than 50%  of this time was spent counseling and coordinating care related to the above assessment and plan.  Signed by: Altha Harm, PhD, NP-C

## 2020-09-06 NOTE — Progress Notes (Signed)
States 10 days after last tx had some chills, cramps in both hands and a rash that did itch which was unusual for him.

## 2020-09-06 NOTE — Progress Notes (Signed)
Ralph Williams CONSULT NOTE  Patient Care Team: Ralph Kayser, MD as PCP - General (Internal Medicine) Ralph Nab, RN as Oncology Nurse Navigator  CHIEF COMPLAINTS/PURPOSE OF CONSULTATION: lung nodule/mass  #  Oncology History Overview Note  # July 2021- RLL- 9.4 cm x 7.5 cm x 6.7 cm lobulated low-attenuation heterogeneous lung mass within the posteromedial aspect of the right lower lobe, at the level of the right hilum; positive for hilar; mediastinal adenopathy; PET scan-July 2021 + for adrenal metastases; CT-guided biopsy-non-small cell favor adenocarcinoma; necrosis  # carbo-alimtac  Ralph Williams; NGS pending] cycle #2 carbo Alimta Keytruda.  # 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 CKD  # NGS/MOLECULAR TESTS: TPS =70%; rest **  # PALLIATIVE CARE EVALUATION:     DIAGNOSIS: Lung cancer  STAGE: 4       ;  GOALS: Palliative  CURRENT/MOST RECENT THERAPY : Carbo Alimta Keytruda    Cancer of lower lobe of right lung ([place])  07/05/2020 Initial Diagnosis   Cancer of lower lobe of right lung ([place])   07/26/2020 -  Chemotherapy   The patient had dexamethasone (DECADRON) 4 MG tablet, 1 of 1 cycle, Start date: --, End date: -- palonosetron (ALOXI) injection 0.25 mg, 0.25 mg, Intravenous,  Once, 3 of 4 cycles Administration: 0.25 mg (07/26/2020), 0.25 mg (08/16/2020) PEMEtrexed (ALIMTA) 1,100 mg in sodium chloride 0.9 % 100 mL chemo infusion, 500 mg/m2 = 1,100 mg, Intravenous,  Once, 3 of 6 cycles Administration: 1,100 mg (07/26/2020), 1,100 mg (08/16/2020) CARBOplatin (PARAPLATIN) 510 mg in sodium chloride 0.9 % 250 mL chemo infusion, 510 mg (100 % of original dose 513.5 mg), Intravenous,  Once, 3 of 4 cycles Dose modification:   (original dose 513.5 mg, Cycle 1) Administration: 510 mg (07/26/2020), 510 mg (08/16/2020) fosaprepitant (EMEND) 150 mg in sodium chloride 0.9 % 145 mL IVPB, 150 mg, Intravenous,   Once, 3 of 4 cycles Administration: 150 mg (07/26/2020), 150 mg (08/16/2020) pembrolizumab (KEYTRUDA) 200 mg in sodium chloride 0.9 % 50 mL chemo infusion, 200 mg, Intravenous, Once, 2 of 5 cycles Administration: 200 mg (08/16/2020)  for chemotherapy treatment.       HISTORY OF PRESENTING ILLNESS:  Ralph Williams 79 y.o.  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   Patient denies any diarrhea. Denies any worsening shortness of breath or cough.  Complains of mild skin rash on his arms.  Had episode of cramping of his hands currently resolved.   Review of Systems  Constitutional: Negative for chills, diaphoresis, fever, malaise/fatigue and weight loss.  HENT: Negative for nosebleeds and sore throat.   Eyes: Negative for double vision.  Respiratory: Negative for hemoptysis, sputum production and shortness of breath.   Cardiovascular: Negative for chest pain, palpitations, orthopnea and leg swelling.  Gastrointestinal: Negative for abdominal pain, blood in stool, constipation, diarrhea, heartburn, melena, nausea and vomiting.  Genitourinary: Negative for dysuria, frequency and urgency.  Musculoskeletal: Negative for back pain and joint pain.  Skin: Negative.  Negative for itching and rash.  Neurological: Negative for dizziness, tingling, focal weakness, weakness and headaches.  Endo/Heme/Allergies: Does not bruise/bleed easily.  Psychiatric/Behavioral: Negative for depression. The patient has insomnia. The patient is not nervous/anxious.      MEDICAL HISTORY:  Past Medical History:  Diagnosis Date  . Chronic renal insufficiency   . Diabetes mellitus without complication (Ralph Williams)  Pt states it's diet controlled.  Marland Kitchen GERD (gastroesophageal reflux disease)   . Hyperlipidemia associated with type 2 diabetes mellitus (Ralph Williams)   . Hypertension   . Lung mass     SURGICAL HISTORY: Past Surgical History:   Procedure Laterality Date  . IR IMAGING GUIDED PORT INSERTION  07/22/2020  . lymphoma removal     back    SOCIAL HISTORY: Social History   Socioeconomic History  . Marital status: Married    Spouse name: Not on file  . Number of children: Not on file  . Years of education: Not on file  . Highest education level: Not on file  Occupational History  . Not on file  Tobacco Use  . Smoking status: Former Smoker    Quit date: 07/23/2007    Years since quitting: 13.1  . Smokeless tobacco: Never Used  Vaping Use  . Vaping Use: Never used  Substance and Sexual Activity  . Alcohol use: Not on file    Comment: rarely  . Drug use: Never  . Sexual activity: Not on file  Other Topics Concern  . Not on file  Social History Narrative   Moved from [place]; lives in [place] in 2021; daughter lives next door. Quit smoking- 13 years ago. Social alcohol. 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    Social Determinants of Health   Financial Resource Strain:   . Difficulty of Paying Living Expenses: Not on file  Food Insecurity:   . Worried About Charity fundraiser in the Last Year: Not on file  . Ran Out of Food in the Last Year: Not on file  Transportation Needs:   . Lack of Transportation (Medical): Not on file  . Lack of Transportation (Non-Medical): Not on file  Physical Activity:   . Days of Exercise per Week: Not on file  . Minutes of Exercise per Session: Not on file  Stress:   . Feeling of Stress : Not on file  Social Connections:   . Frequency of Communication with Friends and Family: Not on file  . Frequency of Social Gatherings with Friends and Family: Not on file  . Attends Religious Services: Not on file  . Active Member of Clubs or Organizations: Not on file  . Attends Archivist Meetings: Not on file  . Marital Status: Not on file  Intimate Partner Violence:   . Fear of Current or Ex-Partner: Not on file  . Emotionally Abused: Not on  file  . Physically Abused: Not on file  . Sexually Abused: Not on file    FAMILY HISTORY: Family History  Problem Relation Age of Onset  . Liver cancer Sister   . Cancer - Other Brother        bone cancer [2001]    ALLERGIES:  is allergic to niacin and related.  MEDICATIONS:  Current Outpatient Medications  Medication Sig Dispense Refill  . acetaminophen (TYLENOL) 500 MG tablet Take 2 tablets by mouth every 8 (eight) hours.     Marland Kitchen albuterol (VENTOLIN HFA) 108 (90 Base) MCG/ACT inhaler Inhale 2 puffs into the lungs in the morning, at noon, in the evening, and at bedtime.    Marland Kitchen aspirin 325 MG EC tablet Take 1 tablet by mouth daily.    Marland Kitchen atenolol (TENORMIN) 25 MG tablet Take 1 tablet by mouth daily.    Marland Kitchen atorvastatin (LIPITOR) 40 MG tablet Take 1 tablet by mouth daily.    Marland Kitchen dexamethasone (DECADRON) 4 MG tablet Take one  pill AM & PM; TAKE the day before & day after chemo; DO NOT Take on the day of chemo. ONLY for 2 days. 60 tablet 0  . folic acid (FOLVITE) 1 MG tablet Take 1 tablet (1 mg total) by mouth daily. 90 tablet 1  . hydrochlorothiazide (HYDRODIURIL) 12.5 MG tablet Take 12.5 mg by mouth daily.    Marland Kitchen lidocaine-prilocaine (EMLA) cream Apply 1 application topically as needed. 30 g 0  . losartan (COZAAR) 50 MG tablet Take 1 tablet by mouth daily.    . Melatonin 10 MG TABS Take 1 tablet by mouth at bedtime.    . Omega-3 1000 MG CAPS Take 1,000 mg by mouth in the morning and at bedtime.    Marland Kitchen omeprazole (PRILOSEC) 20 MG capsule Take 20 mg by mouth in the morning and at bedtime.    . ondansetron (ZOFRAN) 8 MG tablet One pill every 8 hours as needed for nausea/vomitting. 40 tablet 1  . prochlorperazine (COMPAZINE) 10 MG tablet Take 1 tablet (10 mg total) by mouth every 6 (six) hours as needed for nausea or vomiting. 40 tablet 1  . tadalafil (CIALIS) 20 MG tablet Take 1 tablet by mouth daily.    . Zinc 22.5 MG TABS Take 22 mg by mouth daily.     No current facility-administered medications  for this visit.   Facility-Administered Medications Ordered in Other Visits  Medication Dose Route Frequency Provider Last Rate Last Admin  . CARBOplatin (PARAPLATIN) 450 mg in sodium chloride 0.9 % 250 mL chemo infusion  450 mg Intravenous Once Charlaine Dalton R, MD      . cyanocobalamin ((VITAMIN B-12)) injection 1,000 mcg  1,000 mcg Intramuscular Once Charlaine Dalton R, MD      . dexamethasone (DECADRON) 10 mg in sodium chloride 0.9 % 50 mL IVPB  10 mg Intravenous Once Charlaine Dalton R, MD      . fosaprepitant (EMEND) 150 mg in sodium chloride 0.9 % 145 mL IVPB  150 mg Intravenous Once Charlaine Dalton R, MD      . heparin lock flush 100 unit/mL  500 Units Intravenous Once Cammie Sickle, MD      . pembrolizumab [facility]) 200 mg in sodium chloride 0.9 % 50 mL chemo infusion  200 mg Intravenous Once Charlaine Dalton R, MD      . PEMEtrexed (ALIMTA) 1,100 mg in sodium chloride 0.9 % 100 mL chemo infusion  500 mg/m2 (Treatment Plan Recorded) Intravenous Once Charlaine Dalton R, MD      . sodium chloride flush (NS) 0.9 % injection 10 mL  10 mL Intravenous Once Charlaine Dalton R, MD          .  PHYSICAL EXAMINATION: ECOG PERFORMANCE STATUS: 1 - Symptomatic but completely ambulatory  Vitals:   09/06/20 0821  BP: 130/64  Pulse: 71  Resp: 18  Temp: 97.6 F (36.4 C)  SpO2: 100%   Filed Weights   09/06/20 0821  Weight: 231 lb (104.8 kg)    Physical Exam Constitutional:      Comments: Ambulating independently.  Accompanied by his wife.   HENT:     Head: Normocephalic and atraumatic.     Mouth/Throat:     Pharynx: No oropharyngeal exudate.  Eyes:     Pupils: Pupils are equal, round, and reactive to light.  Cardiovascular:     Rate and Rhythm: Normal rate and regular rhythm.  Pulmonary:     Effort: Pulmonary effort is normal. No respiratory distress.  Breath sounds: Normal breath sounds. No wheezing.  Abdominal:     General: Bowel sounds are  normal. There is no distension.     Palpations: Abdomen is soft. There is no mass.     Tenderness: There is no abdominal tenderness. There is no guarding or rebound.  Musculoskeletal:        General: No tenderness. Normal range of motion.     Cervical back: Normal range of motion and neck supple.  Skin:    General: Skin is warm.  Neurological:     Mental Status: He is alert and oriented to person, place, and time.  Psychiatric:        Mood and Affect: Affect normal.      LABORATORY DATA:  I have reviewed the data as listed Lab Results  Component Value Date   WBC 3.4 (L) 09/06/2020   HGB 10.3 (L) 09/06/2020   HCT 30.4 (L) 09/06/2020   MCV 87.4 09/06/2020   PLT 232 09/06/2020   Recent Labs    07/26/20 0816 08/05/20 1017 08/16/20 0802 08/27/20 1127 09/06/20 0806  NA 136   < > 136 134* 139  K 4.4   < > 4.6 4.4 4.7  CL 105   < > 102 99 106  CO2 22   < > 25 27 22   GLUCOSE 201*   < > 176* 115* 210*  BUN 33*   < > 49* 36* 31*  CREATININE 1.16   < > 1.13 1.30* 1.37*  CALCIUM 8.5*   < > 8.6* 8.1* 8.5*  GFRNONAA >60   < > >60 53* 49*  GFRAA >60   < > >60 >60 57*  PROT 7.2  --  6.0*  --  6.5  ALBUMIN 3.3*  --  3.3*  --  3.2*  AST 18  --  18  --  39  ALT 26  --  39  --  117*  ALKPHOS 73  --  55  --  67  BILITOT 0.6  --  0.9  --  0.8   < > = values in this interval not displayed.    RADIOGRAPHIC STUDIES: I have personally reviewed the radiological images as listed and agreed with the findings in the report. No results found.  ASSESSMENT & PLAN:   Cancer of lower lobe of right lung ([place]) # Lung cancer-non-small cell favor adeno ca; Stage IV RLL mass- ~9 cm; with hilar/mediastinal adenopathy; with left adrenal metastases.; PD-1- 70%.   # Proceed with carboplatin and Alimta + keytruda cycle #3; Labs today reviewed;  acceptable for treatment today-except for slightly elevated ALT see below. Will order scan CT scan today.    #LFT-AST normal ALT- 117, bilirubin-question  related to chemoimmunotherapy monitor closely.  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  # Skin rash- G-1; chemo- monitor for now.  Currently resolved.  Monitor closely.  # Bil UE cramps-calcium 8.6.  Recommend ca+vit D BID  # DISPOSITION: # carbo- alimta today- keytruda. # 10 days- labs- cbc/CMP # 3 week-MD; labs- cbc/cmp; carbo-alimta-keytruda; CT chest- prior- Dr.B    All questions were answered. The patient knows to call the clinic with any problems, questions or concerns.    Cammie Sickle, MD 09/06/2020 9:27 AM

## 2020-09-06 NOTE — Assessment & Plan Note (Addendum)
#   Lung cancer-non-small cell favor adeno ca; Stage IV RLL mass- ~9 cm; with hilar/mediastinal adenopathy; with left adrenal metastases.; PD-1- 70%.   # Proceed with carboplatin and Alimta + keytruda cycle #3; Labs today reviewed;  acceptable for treatment today-except for slightly elevated ALT see below. Will order scan CT scan today.    #LFT-AST normal ALT- 117, bilirubin-question related to chemoimmunotherapy monitor closely.  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  # Skin rash- G-1; chemo- monitor for now.  Currently resolved.  Monitor closely.  # Bil UE cramps-calcium 8.6.  Recommend ca+vit D BID  # DISPOSITION: # carbo- alimta today- keytruda. # 10 days- labs- cbc/CMP # 3 week-MD; labs- cbc/cmp; carbo-alimta-keytruda; CT chest- prior- Dr.B

## 2020-09-20 ENCOUNTER — Other Ambulatory Visit: Payer: Self-pay

## 2020-09-20 ENCOUNTER — Inpatient Hospital Stay: Payer: Medicare PPO | Attending: Internal Medicine

## 2020-09-20 DIAGNOSIS — I129 Hypertensive chronic kidney disease with stage 1 through stage 4 chronic kidney disease, or unspecified chronic kidney disease: Secondary | ICD-10-CM | POA: Insufficient documentation

## 2020-09-20 DIAGNOSIS — C3431 Malignant neoplasm of lower lobe, right bronchus or lung: Secondary | ICD-10-CM | POA: Diagnosis present

## 2020-09-20 DIAGNOSIS — Z87891 Personal history of nicotine dependence: Secondary | ICD-10-CM | POA: Diagnosis not present

## 2020-09-20 DIAGNOSIS — I2510 Atherosclerotic heart disease of native coronary artery without angina pectoris: Secondary | ICD-10-CM | POA: Diagnosis not present

## 2020-09-20 DIAGNOSIS — E785 Hyperlipidemia, unspecified: Secondary | ICD-10-CM | POA: Diagnosis not present

## 2020-09-20 DIAGNOSIS — E119 Type 2 diabetes mellitus without complications: Secondary | ICD-10-CM | POA: Diagnosis not present

## 2020-09-20 DIAGNOSIS — Z5111 Encounter for antineoplastic chemotherapy: Secondary | ICD-10-CM | POA: Diagnosis present

## 2020-09-20 DIAGNOSIS — R7989 Other specified abnormal findings of blood chemistry: Secondary | ICD-10-CM | POA: Diagnosis not present

## 2020-09-20 DIAGNOSIS — Z7982 Long term (current) use of aspirin: Secondary | ICD-10-CM | POA: Insufficient documentation

## 2020-09-20 DIAGNOSIS — K219 Gastro-esophageal reflux disease without esophagitis: Secondary | ICD-10-CM | POA: Insufficient documentation

## 2020-09-20 DIAGNOSIS — Z79899 Other long term (current) drug therapy: Secondary | ICD-10-CM | POA: Insufficient documentation

## 2020-09-20 DIAGNOSIS — N183 Chronic kidney disease, stage 3 unspecified: Secondary | ICD-10-CM | POA: Insufficient documentation

## 2020-09-20 DIAGNOSIS — C7970 Secondary malignant neoplasm of unspecified adrenal gland: Secondary | ICD-10-CM | POA: Insufficient documentation

## 2020-09-20 DIAGNOSIS — Z515 Encounter for palliative care: Secondary | ICD-10-CM | POA: Diagnosis not present

## 2020-09-20 LAB — CBC WITH DIFFERENTIAL/PLATELET
Abs Immature Granulocytes: 0.05 10*3/uL (ref 0.00–0.07)
Basophils Absolute: 0 10*3/uL (ref 0.0–0.1)
Basophils Relative: 0 %
Eosinophils Absolute: 0 10*3/uL (ref 0.0–0.5)
Eosinophils Relative: 0 %
HCT: 27.9 % — ABNORMAL LOW (ref 39.0–52.0)
Hemoglobin: 9.9 g/dL — ABNORMAL LOW (ref 13.0–17.0)
Immature Granulocytes: 1 %
Lymphocytes Relative: 49 %
Lymphs Abs: 1.9 10*3/uL (ref 0.7–4.0)
MCH: 30.2 pg (ref 26.0–34.0)
MCHC: 35.5 g/dL (ref 30.0–36.0)
MCV: 85.1 fL (ref 80.0–100.0)
Monocytes Absolute: 0.4 10*3/uL (ref 0.1–1.0)
Monocytes Relative: 12 %
Neutro Abs: 1.5 10*3/uL — ABNORMAL LOW (ref 1.7–7.7)
Neutrophils Relative %: 38 %
Platelets: 50 10*3/uL — ABNORMAL LOW (ref 150–400)
RBC: 3.28 MIL/uL — ABNORMAL LOW (ref 4.22–5.81)
RDW: 15.1 % (ref 11.5–15.5)
WBC: 3.8 10*3/uL — ABNORMAL LOW (ref 4.0–10.5)
nRBC: 0.5 % — ABNORMAL HIGH (ref 0.0–0.2)

## 2020-09-20 LAB — COMPREHENSIVE METABOLIC PANEL
ALT: 106 U/L — ABNORMAL HIGH (ref 0–44)
AST: 72 U/L — ABNORMAL HIGH (ref 15–41)
Albumin: 3.2 g/dL — ABNORMAL LOW (ref 3.5–5.0)
Alkaline Phosphatase: 68 U/L (ref 38–126)
Anion gap: 10 (ref 5–15)
BUN: 24 mg/dL — ABNORMAL HIGH (ref 8–23)
CO2: 25 mmol/L (ref 22–32)
Calcium: 8.4 mg/dL — ABNORMAL LOW (ref 8.9–10.3)
Chloride: 109 mmol/L (ref 98–111)
Creatinine, Ser: 1.29 mg/dL — ABNORMAL HIGH (ref 0.61–1.24)
GFR calc Af Amer: >60 mL/min (ref >60)
GFR calc non Af Amer: 53 mL/min — ABNORMAL LOW (ref >60)
Glucose, Bld: 164 mg/dL — ABNORMAL HIGH (ref 70–99)
Potassium: 3.5 mmol/L (ref 3.5–5.1)
Sodium: 144 mmol/L (ref 135–145)
Total Bilirubin: 0.8 mg/dL (ref 0.3–1.2)
Total Protein: 6.4 g/dL — ABNORMAL LOW (ref 6.5–8.1)

## 2020-09-20 MED ORDER — HEPARIN SOD (PORK) LOCK FLUSH 100 UNIT/ML IV SOLN
500.0000 [IU] | Freq: Once | INTRAVENOUS | Status: AC
  Administered 2020-09-20: 500 [IU] via INTRAVENOUS
  Filled 2020-09-20: qty 5

## 2020-09-20 MED ORDER — HEPARIN SOD (PORK) LOCK FLUSH 100 UNIT/ML IV SOLN
INTRAVENOUS | Status: AC
  Filled 2020-09-20: qty 5

## 2020-09-20 MED ORDER — SODIUM CHLORIDE 0.9% FLUSH
10.0000 mL | INTRAVENOUS | Status: DC | PRN
  Administered 2020-09-20: 10 mL via INTRAVENOUS
  Filled 2020-09-20: qty 10

## 2020-09-23 ENCOUNTER — Other Ambulatory Visit: Payer: Self-pay

## 2020-09-23 ENCOUNTER — Telehealth: Payer: Self-pay | Admitting: Internal Medicine

## 2020-09-23 ENCOUNTER — Ambulatory Visit
Admission: RE | Admit: 2020-09-23 | Discharge: 2020-09-23 | Disposition: A | Discharge status: Home / Self Care | Payer: Medicare PPO | Source: Ambulatory Visit | Attending: Internal Medicine | Admitting: Internal Medicine

## 2020-09-23 DIAGNOSIS — C3431 Malignant neoplasm of lower lobe, right bronchus or lung: Secondary | ICD-10-CM | POA: Diagnosis present

## 2020-09-23 MED ORDER — IOHEXOL 300 MG/ML  SOLN
75.0000 mL | Freq: Once | INTRAMUSCULAR | Status: AC | PRN
  Administered 2020-09-23: 75 mL via INTRAVENOUS

## 2020-09-23 NOTE — Telephone Encounter (Signed)
On 10/04-I spoke to patient regarding results of the CT scan-response to chemotherapy noted; slightly elevated LFTs monitor for now.  Follow-up as planned

## 2020-09-23 NOTE — Telephone Encounter (Signed)
On 10/01- LVM regarding results of the lab work slightly elevated LFTs.  No major concerns otherwise.  Follow-up as planned  GB

## 2020-09-27 ENCOUNTER — Inpatient Hospital Stay: Payer: Medicare PPO

## 2020-09-27 ENCOUNTER — Other Ambulatory Visit: Payer: Self-pay

## 2020-09-27 ENCOUNTER — Inpatient Hospital Stay (HOSPITAL_BASED_OUTPATIENT_CLINIC_OR_DEPARTMENT_OTHER): Payer: Medicare PPO | Admitting: Internal Medicine

## 2020-09-27 ENCOUNTER — Inpatient Hospital Stay (HOSPITAL_BASED_OUTPATIENT_CLINIC_OR_DEPARTMENT_OTHER): Payer: Medicare PPO | Admitting: Hospice and Palliative Medicine

## 2020-09-27 ENCOUNTER — Encounter: Payer: Self-pay | Admitting: Internal Medicine

## 2020-09-27 DIAGNOSIS — Z515 Encounter for palliative care: Secondary | ICD-10-CM

## 2020-09-27 DIAGNOSIS — C3431 Malignant neoplasm of lower lobe, right bronchus or lung: Secondary | ICD-10-CM

## 2020-09-27 DIAGNOSIS — Z7189 Other specified counseling: Secondary | ICD-10-CM | POA: Diagnosis not present

## 2020-09-27 DIAGNOSIS — Z5111 Encounter for antineoplastic chemotherapy: Secondary | ICD-10-CM | POA: Diagnosis not present

## 2020-09-27 LAB — CBC WITH DIFFERENTIAL/PLATELET
Abs Immature Granulocytes: 0.04 10*3/uL (ref 0.00–0.07)
Basophils Absolute: 0 10*3/uL (ref 0.0–0.1)
Basophils Relative: 0 %
Eosinophils Absolute: 0 10*3/uL (ref 0.0–0.5)
Eosinophils Relative: 0 %
HCT: 30.2 % — ABNORMAL LOW (ref 39.0–52.0)
Hemoglobin: 10.3 g/dL — ABNORMAL LOW (ref 13.0–17.0)
Immature Granulocytes: 1 %
Lymphocytes Relative: 29 %
Lymphs Abs: 1.1 10*3/uL (ref 0.7–4.0)
MCH: 30.2 pg (ref 26.0–34.0)
MCHC: 34.1 g/dL (ref 30.0–36.0)
MCV: 88.6 fL (ref 80.0–100.0)
Monocytes Absolute: 0.3 10*3/uL (ref 0.1–1.0)
Monocytes Relative: 7 %
Neutro Abs: 2.4 10*3/uL (ref 1.7–7.7)
Neutrophils Relative %: 63 %
Platelets: 187 10*3/uL (ref 150–400)
RBC: 3.41 MIL/uL — ABNORMAL LOW (ref 4.22–5.81)
RDW: 18.7 % — ABNORMAL HIGH (ref 11.5–15.5)
WBC: 3.9 10*3/uL — ABNORMAL LOW (ref 4.0–10.5)
nRBC: 0.5 % — ABNORMAL HIGH (ref 0.0–0.2)

## 2020-09-27 LAB — COMPREHENSIVE METABOLIC PANEL
ALT: 39 U/L (ref 0–44)
AST: 27 U/L (ref 15–41)
Albumin: 3.6 g/dL (ref 3.5–5.0)
Alkaline Phosphatase: 72 U/L (ref 38–126)
Anion gap: 10 (ref 5–15)
BUN: 24 mg/dL — ABNORMAL HIGH (ref 8–23)
CO2: 23 mmol/L (ref 22–32)
Calcium: 8.3 mg/dL — ABNORMAL LOW (ref 8.9–10.3)
Chloride: 104 mmol/L (ref 98–111)
Creatinine, Ser: 1.15 mg/dL (ref 0.61–1.24)
GFR calc non Af Amer: >60 mL/min (ref >60)
Glucose, Bld: 253 mg/dL — ABNORMAL HIGH (ref 70–99)
Potassium: 4 mmol/L (ref 3.5–5.1)
Sodium: 137 mmol/L (ref 135–145)
Total Bilirubin: 1 mg/dL (ref 0.3–1.2)
Total Protein: 7.1 g/dL (ref 6.5–8.1)

## 2020-09-27 MED ORDER — SODIUM CHLORIDE 0.9 % IV SOLN
500.0000 mg/m2 | Freq: Once | INTRAVENOUS | Status: AC
  Administered 2020-09-27: 1100 mg via INTRAVENOUS
  Filled 2020-09-27: qty 40

## 2020-09-27 MED ORDER — SODIUM CHLORIDE 0.9 % IV SOLN
10.0000 mg | Freq: Once | INTRAVENOUS | Status: AC
  Administered 2020-09-27: 10 mg via INTRAVENOUS
  Filled 2020-09-27: qty 10

## 2020-09-27 MED ORDER — SODIUM CHLORIDE 0.9 % IV SOLN
200.0000 mg | Freq: Once | INTRAVENOUS | Status: AC
  Administered 2020-09-27: 200 mg via INTRAVENOUS
  Filled 2020-09-27: qty 8

## 2020-09-27 MED ORDER — SODIUM CHLORIDE 0.9 % IV SOLN
150.0000 mg | Freq: Once | INTRAVENOUS | Status: AC
  Administered 2020-09-27: 150 mg via INTRAVENOUS
  Filled 2020-09-27: qty 5

## 2020-09-27 MED ORDER — HEPARIN SOD (PORK) LOCK FLUSH 100 UNIT/ML IV SOLN
500.0000 [IU] | Freq: Once | INTRAVENOUS | Status: AC | PRN
  Administered 2020-09-27: 500 [IU]
  Filled 2020-09-27: qty 5

## 2020-09-27 MED ORDER — SODIUM CHLORIDE 0.9 % IV SOLN
517.0000 mg | Freq: Once | INTRAVENOUS | Status: AC
  Administered 2020-09-27: 520 mg via INTRAVENOUS
  Filled 2020-09-27: qty 52

## 2020-09-27 MED ORDER — SODIUM CHLORIDE 0.9 % IV SOLN
Freq: Once | INTRAVENOUS | Status: AC
  Filled 2020-09-27: qty 250

## 2020-09-27 MED ORDER — PALONOSETRON HCL INJECTION 0.25 MG/5ML
0.2500 mg | Freq: Once | INTRAVENOUS | Status: AC
  Administered 2020-09-27: 0.25 mg via INTRAVENOUS
  Filled 2020-09-27: qty 5

## 2020-09-27 NOTE — Progress Notes (Signed)
Ralph Williams  [phone] [phone]   Name: Ralph Williams Date: 09/27/2020 MRN: [id number]  DOB: 1941-01-16  Patient Care Team: Ralph Kayser, MD as PCP - General (Internal Medicine) Ralph Nab, RN as Oncology Nurse Navigator    REASON FOR CONSULTATION: Ralph Williams is a 79 y.o. male with multiple medical problems including stage IV non-small cell lung cancer diagnosed July 2021 and was on systemic chemotherapy.  Palliative care was referred to discuss goals and support patient for treatment.  SOCIAL HISTORY:     reports that he quit smoking about 13 years ago. He has never used smokeless tobacco. He reports that he does not use drugs.   Patient is married and lives at home with his wife. Patient moved to [place] from [facility] to live near his daughter, who now lives next door.  Patient has 4 children.  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  ADVANCE DIRECTIVES:  Not on file  CODE STATUS:   PAST MEDICAL HISTORY: Past Medical History:  Diagnosis Date  . Chronic renal insufficiency   . Diabetes mellitus without complication ([place])    Pt states it's diet controlled.  Marland Kitchen GERD (gastroesophageal reflux disease)   . Hyperlipidemia associated with type 2 diabetes mellitus ([place])   . Hypertension   . Lung mass     PAST SURGICAL HISTORY:  Past Surgical History:  Procedure Laterality Date  . IR IMAGING GUIDED PORT INSERTION  07/22/2020  . 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  # 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 CKD  # NGS/MOLECULAR TESTS: TPS =70%; rest **  # PALLIATIVE CARE EVALUATION:     DIAGNOSIS: Lung cancer  STAGE: 4       ;  GOALS: Palliative  CURRENT/MOST RECENT THERAPY : Carbo Alimta Keytruda    Cancer of lower lobe of right lung ([place])  07/05/2020 Initial Diagnosis   Cancer of lower lobe of right lung ([place])   07/26/2020 -  Chemotherapy   The patient had dexamethasone (DECADRON) 4 MG tablet, 1 of 1 cycle, Start date: --, End date: -- palonosetron (ALOXI) injection 0.25 mg, 0.25 mg, Intravenous,  Once, 4 of 4 cycles Administration: 0.25 mg (07/26/2020), 0.25 mg (09/06/2020), 0.25 mg (08/16/2020) PEMEtrexed (ALIMTA) 1,100 mg in sodium chloride 0.9 % 100 mL chemo infusion, 500 mg/m2 = 1,100 mg, Intravenous,  Once, 4 of 6 cycles Administration: 1,100 mg (07/26/2020), 1,100 mg (09/06/2020), 1,100 mg (08/16/2020) CARBOplatin (PARAPLATIN) 510 mg in sodium chloride 0.9 % 250 mL chemo infusion, 510 mg (100 % of original dose 513.5 mg), Intravenous,  Once, 4 of 4 cycles Dose modification:   (original dose 513.5 mg, Cycle 1) Administration: 510 mg (07/26/2020), 450 mg (09/06/2020), 510 mg (08/16/2020) fosaprepitant (EMEND) 150 mg in sodium chloride 0.9 % 145 mL IVPB, 150 mg, Intravenous,  Once, 4 of 4 cycles Administration: 150 mg (07/26/2020), 150 mg (09/06/2020), 150 mg (08/16/2020) pembrolizumab (KEYTRUDA) 200  mg in sodium chloride 0.9 % 50 mL chemo infusion, 200 mg, Intravenous, Once, 3 of 5 cycles Administration: 200 mg (09/06/2020), 200 mg (08/16/2020)  for chemotherapy treatment.      ALLERGIES:  is allergic to niacin and related.  MEDICATIONS:  Current Outpatient Medications  Medication Sig Dispense Refill  . acetaminophen (TYLENOL) 500 MG tablet Take 2 tablets by mouth every 8 (eight) hours.     Marland Kitchen albuterol (VENTOLIN  HFA) 108 (90 Base) MCG/ACT inhaler Inhale 2 puffs into the lungs in the morning, at noon, in the evening, and at bedtime.    Marland Kitchen aspirin 325 MG EC tablet Take 1 tablet by mouth daily.    Marland Kitchen atenolol (TENORMIN) 25 MG tablet Take 1 tablet by mouth daily.    Marland Kitchen atorvastatin (LIPITOR) 40 MG tablet Take 1 tablet by mouth daily.    Marland Kitchen dexamethasone (DECADRON) 4 MG tablet Take one pill AM & PM; TAKE the day before & day after chemo; DO NOT Take on the day of chemo. ONLY for 2 days. 60 tablet 0  . folic acid (FOLVITE) 1 MG tablet Take 1 tablet (1 mg total) by mouth daily. 90 tablet 1  . hydrochlorothiazide (HYDRODIURIL) 12.5 MG tablet Take 12.5 mg by mouth daily.    Marland Kitchen lidocaine-prilocaine (EMLA) cream Apply 1 application topically as needed. 30 g 0  . losartan (COZAAR) 50 MG tablet Take 1 tablet by mouth daily.    . Melatonin 10 MG TABS Take 1 tablet by mouth at bedtime.    . Omega-3 1000 MG CAPS Take 1,000 mg by mouth in the morning and at bedtime.    Marland Kitchen omeprazole (PRILOSEC) 20 MG capsule Take 20 mg by mouth in the morning and at bedtime.    . ondansetron (ZOFRAN) 8 MG tablet One pill every 8 hours as needed for nausea/vomitting. 40 tablet 1  . prochlorperazine (COMPAZINE) 10 MG tablet Take 1 tablet (10 mg total) by mouth every 6 (six) hours as needed for nausea or vomiting. 40 tablet 1  . tadalafil (CIALIS) 20 MG tablet Take 1 tablet by mouth daily.    . Zinc 22.5 MG TABS Take 22 mg by mouth daily.     No current facility-administered medications for this visit.   Facility-Administered Medications Ordered in Other Visits  Medication Dose Route Frequency Provider Last Rate Last Admin  . CARBOplatin (PARAPLATIN) 520 mg in sodium chloride 0.9 % 250 mL chemo infusion  520 mg Intravenous Once Ralph Dalton R, MD 604 mL/hr at 09/27/20 1142 520 mg at 09/27/20 1142  . heparin lock flush 100 unit/mL  500 Units Intracatheter Once PRN Ralph Sickle, MD        VITAL SIGNS: There were no vitals taken  for this visit. There were no vitals filed for this visit.  Estimated body mass index is 36.26 kg/m as calculated from the following:   Height as of an earlier encounter on 09/27/20: 5' 7.5" (1.715 m).   Weight as of an earlier encounter on 09/27/20: 235 lb (106.6 kg).  LABS: CBC:    Component Value Date/Time   WBC 3.9 (L) 09/27/2020 0821   HGB 10.3 (L) 09/27/2020 0821   HCT 30.2 (L) 09/27/2020 0821   PLT 187 09/27/2020 0821   MCV 88.6 09/27/2020 0821   NEUTROABS 2.4 09/27/2020 0821   LYMPHSABS 1.1 09/27/2020 0821   MONOABS 0.3 09/27/2020 0821   EOSABS 0.0 09/27/2020 0821   BASOSABS 0.0 09/27/2020 0821   Comprehensive Metabolic Panel:  Component Value Date/Time   NA 137 09/27/2020 0821   K 4.0 09/27/2020 0821   CL 104 09/27/2020 0821   CO2 23 09/27/2020 0821   BUN 24 (H) 09/27/2020 0821   CREATININE 1.15 09/27/2020 0821   GLUCOSE 253 (H) 09/27/2020 0821   CALCIUM 8.3 (L) 09/27/2020 0821   AST 27 09/27/2020 0821   ALT 39 09/27/2020 0821   ALKPHOS 72 09/27/2020 0821   BILITOT 1.0 09/27/2020 0821   PROT 7.1 09/27/2020 0821   ALBUMIN 3.6 09/27/2020 0821    RADIOGRAPHIC STUDIES: CT Chest W Contrast  Result Date: 09/23/2020 CLINICAL DATA:  Non-small-cell lung cancer.  Restaging. EXAM: CT CHEST WITH CONTRAST TECHNIQUE: Multidetector CT imaging of the chest was performed during intravenous contrast administration. CONTRAST:  83mL OMNIPAQUE IOHEXOL 300 MG/ML  SOLN COMPARISON:  PET-CT 07/11/2020.  Chest CT 06/28/2020. FINDINGS: Cardiovascular: The heart size is normal. No substantial pericardial effusion. Coronary artery calcification is evident. Atherosclerotic calcification is noted in the wall of the thoracic aorta. Right Port-A-Cath tip is in the distal SVC. Mediastinum/Nodes: No mediastinal lymphadenopathy. No evidence for gross hilar lymphadenopathy although assessment is limited by the lack of intravenous contrast on today's study. The esophagus has normal imaging features.  There is no axillary lymphadenopathy. Lungs/Pleura: Interval decrease in the posterior right lower lobe lung mass. Lesion was measured on PET-CT soft tissue windows on 07/11/2020 as 8.8 x 6.7 cm. Lesion is less confluent today and decreased in size measuring 5.6 x 4.1 cm on soft tissue windows (76/2). Mild volume loss again noted right lower lobe with inferior right lower lobe atelectasis. No new suspicious pulmonary nodule or mass. Calcified granuloma again noted anterior left upper lobe. No pleural effusion. Upper Abdomen: Stable 16 mm exophytic lesion upper pole left kidney with attenuation higher than would be expected for a simple cyst. Additional bilateral lesions noted in the upper kidneys. Musculoskeletal: No worrisome lytic or sclerotic osseous abnormality. IMPRESSION: 1. Interval decrease in the right lower lobe neoplasm. No new or progressive findings on today's study. 2. Indeterminate lesions in the upper pole of each kidney, some of which have attenuation too high to be simple cysts. Continued close attention on follow-up recommended as neoplasm not excluded. 3. Aortic Atherosclerosis (ICD10-170.0) Electronically Signed   By: Misty Stanley M.D.   On: 09/23/2020 09:27    PERFORMANCE STATUS (ECOG) : 0 - Asymptomatic  Review of Systems Unless otherwise noted, a complete review of systems is negative.  Physical Exam General: NAD Pulmonary: Unlabored Extremities: no edema, no joint deformities Skin: no rashes Neurological:  nonfocal  IMPRESSION: Routine follow-up visit.  Patient was seen in infusion area.  Patient reports he is doing well.  He denies any significant changes or concerns.  No symptomatic complaints at present.  Patient gave me a copy of his living will, which was scanned into his chart.  I sent patient home with another MOST form to discuss with his wife.  PLAN: -Continue current scope of treatment -ACP scanned into chart -MOST Form previously reviewed -MyChart visit  in 1-2 months  Patient expressed understanding and was in agreement with this plan. He also understands that He can call the clinic at any time with any questions, concerns, or complaints.     Time Total: 15 minutes  Visit consisted of counseling and education dealing with the complex and emotionally intense issues of symptom management and palliative care in the setting of serious and potentially life-threatening illness.Greater than 50%  of this time was spent  counseling and coordinating care related to the above assessment and plan.  Signed by: Altha Harm, PhD, NP-C

## 2020-09-27 NOTE — Assessment & Plan Note (Addendum)
#   Lung cancer-non-small cell favor adeno ca; Stage IV.s/p Carboplatin and Alimta + keytruda cycle #3; OCT 6th CT- Partial response noted-of the right lower lobe mass; improvement of the mediastinal adenopathy and adrenal lesion.  # proceed with Carbo-alimta-Keytruda- Labs today reviewed;  acceptable for treatment today.  Discussed the plan maintenance Keytruda moving forward.   # Elevated LFT-AST normal ALT- 117- Improved today.   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  # Skin rash- G-1; chemo- monitor for now.  Currently resolved.  Monitor closely.  # Bil UE cramps-calcium 8.6.  Improved continue ca+vit D BID  # DISPOSITION: # carbo- alimta-keytruda today # 10 days- labs- cbc/CMP # 3 week-MD; labs- cbc/cmp; Keytruda;TSH-  Dr.B  # I reviewed the blood work- with the patient in detail; also reviewed the imaging independently [as summarized above]; and with the patient in detail.

## 2020-09-27 NOTE — Progress Notes (Signed)
[facility] CONSULT NOTE  Patient Care Team: Ezequiel Kayser, MD as PCP - General (Internal Medicine) Telford Nab, RN as Oncology Nurse Navigator  CHIEF COMPLAINTS/PURPOSE OF CONSULTATION: lung nodule/mass  #  Oncology History Overview Note  # July 2021- RLL- 9.4 cm x 7.5 cm x 6.7 cm lobulated low-attenuation heterogeneous lung mass within the posteromedial aspect of the right lower lobe, at the level of the right hilum; positive for hilar; mediastinal adenopathy; PET scan-July 2021 + for adrenal metastases; CT-guided biopsy-non-small cell favor adenocarcinoma; necrosis  # carbo-alimtac  [name]; NGS pending] cycle #2 carbo Alimta Keytruda.  # 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 CKD  # NGS/MOLECULAR TESTS: TPS =70%; rest **  # PALLIATIVE CARE EVALUATION:     DIAGNOSIS: Lung cancer  STAGE: 4       ;  GOALS: Palliative  CURRENT/MOST RECENT THERAPY : Carbo Alimta Keytruda    Cancer of lower lobe of right lung ([place])  07/05/2020 Initial Diagnosis   Cancer of lower lobe of right lung ([place])   07/26/2020 -  Chemotherapy   The patient had dexamethasone (DECADRON) 4 MG tablet, 1 of 1 cycle, Start date: --, End date: -- palonosetron (ALOXI) injection 0.25 mg, 0.25 mg, Intravenous,  Once, 4 of 4 cycles Administration: 0.25 mg (07/26/2020), 0.25 mg (09/06/2020), 0.25 mg (08/16/2020) PEMEtrexed (ALIMTA) 1,100 mg in sodium chloride 0.9 % 100 mL chemo infusion, 500 mg/m2 = 1,100 mg, Intravenous,  Once, 4 of 6 cycles Administration: 1,100 mg (07/26/2020), 1,100 mg (09/06/2020), 1,100 mg (08/16/2020) CARBOplatin (PARAPLATIN) 510 mg in sodium chloride 0.9 % 250 mL chemo infusion, 510 mg (100 % of original dose 513.5 mg), Intravenous,  Once, 4 of 4 cycles Dose modification:   (original dose 513.5 mg, Cycle 1) Administration: 510 mg (07/26/2020), 450 mg (09/06/2020), 510 mg (08/16/2020) fosaprepitant (EMEND) 150  mg in sodium chloride 0.9 % 145 mL IVPB, 150 mg, Intravenous,  Once, 4 of 4 cycles Administration: 150 mg (07/26/2020), 150 mg (09/06/2020), 150 mg (08/16/2020) pembrolizumab (KEYTRUDA) 200 mg in sodium chloride 0.9 % 50 mL chemo infusion, 200 mg, Intravenous, Once, 3 of 5 cycles Administration: 200 mg (09/06/2020), 200 mg (08/16/2020)  for chemotherapy treatment.       HISTORY OF PRESENTING ILLNESS:  Ralph Williams 79 y.o.  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  Otherwise denies any worsening shortness of breath or cough.  Denies any headaches.  Review of Systems  Constitutional: Negative for chills, diaphoresis, fever, malaise/fatigue and weight loss.  HENT: Negative for nosebleeds and sore throat.   Eyes: Negative for double vision.  Respiratory: Negative for hemoptysis, sputum production and shortness of breath.   Cardiovascular: Negative for chest pain, palpitations, orthopnea and leg swelling.  Gastrointestinal: Negative for abdominal pain, blood in stool, constipation, diarrhea, heartburn, melena, nausea and vomiting.  Genitourinary: Negative for dysuria, frequency and urgency.  Musculoskeletal: Negative for back pain and joint pain.  Skin: Negative.  Negative for itching and rash.  Neurological: Negative for dizziness, tingling, focal weakness, weakness and headaches.  Endo/Heme/Allergies: Does not bruise/bleed easily.  Psychiatric/Behavioral: Negative for depression. The patient is not nervous/anxious.      MEDICAL HISTORY:  Past Medical History:  Diagnosis Date  . Chronic renal insufficiency   . Diabetes mellitus without  complication ([place])    Pt states it's diet controlled.  Marland Kitchen GERD (gastroesophageal reflux disease)   . Hyperlipidemia associated with type 2 diabetes mellitus ([place])   . Hypertension   . Lung  mass     SURGICAL HISTORY: Past Surgical History:  Procedure Laterality Date  . IR IMAGING GUIDED PORT INSERTION  07/22/2020  . lymphoma removal     back    SOCIAL HISTORY: Social History   Socioeconomic History  . Marital status: Married    Spouse name: Not on file  . Number of children: Not on file  . Years of education: Not on file  . Highest education level: Not on file  Occupational History  . Not on file  Tobacco Use  . Smoking status: Former Smoker    Quit date: 07/23/2007    Years since quitting: 13.1  . Smokeless tobacco: Never Used  Vaping Use  . Vaping Use: Never used  Substance and Sexual Activity  . Alcohol use: Not on file    Comment: rarely  . Drug use: Never  . Sexual activity: Not on file  Other Topics Concern  . Not on file  Social History Narrative   Moved from [place]; lives in [place] in 2021; daughter lives next door. Quit smoking- 13 years ago. Social alcohol. 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    Social Determinants of Health   Financial Resource Strain:   . Difficulty of Paying Living Expenses: Not on file  Food Insecurity:   . Worried About Charity fundraiser in the Last Year: Not on file  . Ran Out of Food in the Last Year: Not on file  Transportation Needs:   . Lack of Transportation (Medical): Not on file  . Lack of Transportation (Non-Medical): Not on file  Physical Activity:   . Days of Exercise per Week: Not on file  . Minutes of Exercise per Session: Not on file  Stress:   . Feeling of Stress : Not on file  Social Connections:   . Frequency of Communication with Friends and Family: Not on file  . Frequency of Social Gatherings with Friends and Family: Not on file  . Attends Religious Services: Not on file  . Active Member of Clubs or Organizations: Not on file  . Attends Archivist Meetings: Not on file  . Marital Status: Not on file  Intimate Partner Violence:   . Fear of Current or  Ex-Partner: Not on file  . Emotionally Abused: Not on file  . Physically Abused: Not on file  . Sexually Abused: Not on file    FAMILY HISTORY: Family History  Problem Relation Age of Onset  . Liver cancer Sister   . Cancer - Other Brother        bone cancer [2001]    ALLERGIES:  is allergic to niacin and related.  MEDICATIONS:  Current Outpatient Medications  Medication Sig Dispense Refill  . acetaminophen (TYLENOL) 500 MG tablet Take 2 tablets by mouth every 8 (eight) hours.     Marland Kitchen albuterol (VENTOLIN HFA) 108 (90 Base) MCG/ACT inhaler Inhale 2 puffs into the lungs in the morning, at noon, in the evening, and at bedtime.    Marland Kitchen aspirin 325 MG EC tablet Take 1 tablet by mouth daily.    Marland Kitchen atenolol (TENORMIN) 25 MG tablet Take 1 tablet by mouth daily.    Marland Kitchen atorvastatin (LIPITOR) 40 MG tablet Take 1 tablet by mouth daily.    Marland Kitchen dexamethasone (DECADRON)  4 MG tablet Take one pill AM & PM; TAKE the day before & day after chemo; DO NOT Take on the day of chemo. ONLY for 2 days. 60 tablet 0  . folic acid (FOLVITE) 1 MG tablet Take 1 tablet (1 mg total) by mouth daily. 90 tablet 1  . hydrochlorothiazide (HYDRODIURIL) 12.5 MG tablet Take 12.5 mg by mouth daily.    Marland Kitchen lidocaine-prilocaine (EMLA) cream Apply 1 application topically as needed. 30 g 0  . losartan (COZAAR) 50 MG tablet Take 1 tablet by mouth daily.    . Melatonin 10 MG TABS Take 1 tablet by mouth at bedtime.    . Omega-3 1000 MG CAPS Take 1,000 mg by mouth in the morning and at bedtime.    Marland Kitchen omeprazole (PRILOSEC) 20 MG capsule Take 20 mg by mouth in the morning and at bedtime.    . ondansetron (ZOFRAN) 8 MG tablet One pill every 8 hours as needed for nausea/vomitting. 40 tablet 1  . prochlorperazine (COMPAZINE) 10 MG tablet Take 1 tablet (10 mg total) by mouth every 6 (six) hours as needed for nausea or vomiting. 40 tablet 1  . tadalafil (CIALIS) 20 MG tablet Take 1 tablet by mouth daily.    . Zinc 22.5 MG TABS Take 22 mg by mouth  daily.     No current facility-administered medications for this visit.   Facility-Administered Medications Ordered in Other Visits  Medication Dose Route Frequency Provider Last Rate Last Admin  . CARBOplatin (PARAPLATIN) 520 mg in sodium chloride 0.9 % 250 mL chemo infusion  520 mg Intravenous Once Charlaine Dalton R, MD      . dexamethasone (DECADRON) 10 mg in sodium chloride 0.9 % 50 mL IVPB  10 mg Intravenous Once Charlaine Dalton R, MD      . fosaprepitant (EMEND) 150 mg in sodium chloride 0.9 % 145 mL IVPB  150 mg Intravenous Once Charlaine Dalton R, MD      . heparin lock flush 100 unit/mL  500 Units Intracatheter Once PRN Cammie Sickle, MD      . pembrolizumab [facility]) 200 mg in sodium chloride 0.9 % 50 mL chemo infusion  200 mg Intravenous Once Charlaine Dalton R, MD      . PEMEtrexed (ALIMTA) 1,100 mg in sodium chloride 0.9 % 100 mL chemo infusion  500 mg/m2 (Treatment Plan Recorded) Intravenous Once Cammie Sickle, MD          .  PHYSICAL EXAMINATION: ECOG PERFORMANCE STATUS: 1 - Symptomatic but completely ambulatory  Vitals:   09/27/20 0835  BP: 118/63  Pulse: 82  Resp: 16  Temp: (!) 96.3 F (35.7 C)  SpO2: 99%   Filed Weights   09/27/20 0835  Weight: 235 lb (106.6 kg)    Physical Exam Constitutional:      Comments: Ambulating independently.  Accompanied by his wife.   HENT:     Head: Normocephalic and atraumatic.     Mouth/Throat:     Pharynx: No oropharyngeal exudate.  Eyes:     Pupils: Pupils are equal, round, and reactive to light.  Cardiovascular:     Rate and Rhythm: Normal rate and regular rhythm.  Pulmonary:     Effort: Pulmonary effort is normal. No respiratory distress.     Breath sounds: Normal breath sounds. No wheezing.  Abdominal:     General: Bowel sounds are normal. There is no distension.     Palpations: Abdomen is soft. There is no mass.  Tenderness: There is no abdominal tenderness. There is no guarding  or rebound.  Musculoskeletal:        General: No tenderness. Normal range of motion.     Cervical back: Normal range of motion and neck supple.  Skin:    General: Skin is warm.  Neurological:     Mental Status: He is alert and oriented to person, place, and time.  Psychiatric:        Mood and Affect: Affect normal.      LABORATORY DATA:  I have reviewed the data as listed Lab Results  Component Value Date   WBC 3.9 (L) 09/27/2020   HGB 10.3 (L) 09/27/2020   HCT 30.2 (L) 09/27/2020   MCV 88.6 09/27/2020   PLT 187 09/27/2020   Recent Labs    08/16/20 0802 08/27/20 1127 09/06/20 0806 09/20/20 0846 09/27/20 0821  NA   < > 134* 139 144 137  K   < > 4.4 4.7 3.5 4.0  CL   < > 99 106 109 104  CO2   < > 27 22 25 23   GLUCOSE   < > 115* 210* 164* 253*  BUN   < > 36* 31* 24* 24*  CREATININE   < > 1.30* 1.37* 1.29* 1.15  CALCIUM   < > 8.1* 8.5* 8.4* 8.3*  GFRNONAA   < > 53* 49* 53* >60  GFRAA  --  >60 57* >60  --   PROT   < >  --  6.5 6.4* 7.1  ALBUMIN   < >  --  3.2* 3.2* 3.6  AST   < >  --  39 72* 27  ALT   < >  --  117* 106* 39  ALKPHOS   < >  --  67 68 72  BILITOT   < >  --  0.8 0.8 1.0   < > = values in this interval not displayed.    RADIOGRAPHIC STUDIES: I have personally reviewed the radiological images as listed and agreed with the findings in the report. CT Chest W Contrast  Result Date: 09/23/2020 CLINICAL DATA:  Non-small-cell lung cancer.  Restaging. EXAM: CT CHEST WITH CONTRAST TECHNIQUE: Multidetector CT imaging of the chest was performed during intravenous contrast administration. CONTRAST:  95mL OMNIPAQUE IOHEXOL 300 MG/ML  SOLN COMPARISON:  PET-CT 07/11/2020.  Chest CT 06/28/2020. FINDINGS: Cardiovascular: The heart size is normal. No substantial pericardial effusion. Coronary artery calcification is evident. Atherosclerotic calcification is noted in the wall of the thoracic aorta. Right Port-A-Cath tip is in the distal SVC. Mediastinum/Nodes: No mediastinal  lymphadenopathy. No evidence for gross hilar lymphadenopathy although assessment is limited by the lack of intravenous contrast on today's study. The esophagus has normal imaging features. There is no axillary lymphadenopathy. Lungs/Pleura: Interval decrease in the posterior right lower lobe lung mass. Lesion was measured on PET-CT soft tissue windows on 07/11/2020 as 8.8 x 6.7 cm. Lesion is less confluent today and decreased in size measuring 5.6 x 4.1 cm on soft tissue windows (76/2). Mild volume loss again noted right lower lobe with inferior right lower lobe atelectasis. No new suspicious pulmonary nodule or mass. Calcified granuloma again noted anterior left upper lobe. No pleural effusion. Upper Abdomen: Stable 16 mm exophytic lesion upper pole left kidney with attenuation higher than would be expected for a simple cyst. Additional bilateral lesions noted in the upper kidneys. Musculoskeletal: No worrisome lytic or sclerotic osseous abnormality. IMPRESSION: 1. Interval decrease in the right lower lobe  neoplasm. No new or progressive findings on today's study. 2. Indeterminate lesions in the upper pole of each kidney, some of which have attenuation too high to be simple cysts. Continued close attention on follow-up recommended as neoplasm not excluded. 3. Aortic Atherosclerosis (ICD10-170.0) Electronically Signed   By: Misty Stanley M.D.   On: 09/23/2020 09:27    ASSESSMENT & PLAN:   Cancer of lower lobe of right lung ([place]) # Lung cancer-non-small cell favor adeno ca; Stage IV.s/p Carboplatin and Alimta + keytruda cycle #3; OCT 6th CT- Partial response noted-of the right lower lobe mass; improvement of the mediastinal adenopathy and adrenal lesion.  # proceed with Carbo-alimta-Keytruda- Labs today reviewed;  acceptable for treatment today.  Discussed the plan maintenance Keytruda moving forward.   # Elevated LFT-AST normal ALT- 117- Improved today.   # CKD- III- GFR- 60- STABLE; continue PO  hydration.  # Skin rash- G-1; chemo- monitor for now.  Currently resolved.  Monitor closely.  # Bil UE cramps-calcium 8.6.  Improved continue ca+vit D BID  # DISPOSITION: # carbo- alimta-keytruda today # 10 days- labs- cbc/CMP # 3 week-MD; labs- cbc/cmp; Keytruda;TSH-  Dr.B  # I reviewed the blood work- with the patient in detail; also reviewed the imaging independently [as summarized above]; and with the patient in detail.      All questions were answered. The patient knows to call the clinic with any problems, questions or concerns.    Cammie Sickle, MD 09/27/2020 9:31 AM

## 2020-10-07 ENCOUNTER — Telehealth: Payer: Self-pay | Admitting: *Deleted

## 2020-10-07 ENCOUNTER — Inpatient Hospital Stay: Payer: Medicare PPO

## 2020-10-07 ENCOUNTER — Other Ambulatory Visit: Payer: Self-pay

## 2020-10-07 DIAGNOSIS — Z5111 Encounter for antineoplastic chemotherapy: Secondary | ICD-10-CM | POA: Diagnosis not present

## 2020-10-07 DIAGNOSIS — C3431 Malignant neoplasm of lower lobe, right bronchus or lung: Secondary | ICD-10-CM

## 2020-10-07 LAB — COMPREHENSIVE METABOLIC PANEL
ALT: 31 U/L (ref 0–44)
AST: 28 U/L (ref 15–41)
Albumin: 3.3 g/dL — ABNORMAL LOW (ref 3.5–5.0)
Alkaline Phosphatase: 72 U/L (ref 38–126)
Anion gap: 5 (ref 5–15)
BUN: 25 mg/dL — ABNORMAL HIGH (ref 8–23)
CO2: 26 mmol/L (ref 22–32)
Calcium: 8.4 mg/dL — ABNORMAL LOW (ref 8.9–10.3)
Chloride: 107 mmol/L (ref 98–111)
Creatinine, Ser: 1.27 mg/dL — ABNORMAL HIGH (ref 0.61–1.24)
GFR, Estimated: 54 mL/min — ABNORMAL LOW (ref >60)
Glucose, Bld: 210 mg/dL — ABNORMAL HIGH (ref 70–99)
Potassium: 4 mmol/L (ref 3.5–5.1)
Sodium: 138 mmol/L (ref 135–145)
Total Bilirubin: 0.7 mg/dL (ref 0.3–1.2)
Total Protein: 6.5 g/dL (ref 6.5–8.1)

## 2020-10-07 LAB — CBC WITH DIFFERENTIAL/PLATELET
Abs Immature Granulocytes: 0.01 10*3/uL (ref 0.00–0.07)
Basophils Absolute: 0 10*3/uL (ref 0.0–0.1)
Basophils Relative: 0 %
Eosinophils Absolute: 0 10*3/uL (ref 0.0–0.5)
Eosinophils Relative: 1 %
HCT: 26.5 % — ABNORMAL LOW (ref 39.0–52.0)
Hemoglobin: 9.1 g/dL — ABNORMAL LOW (ref 13.0–17.0)
Immature Granulocytes: 1 %
Lymphocytes Relative: 72 %
Lymphs Abs: 1.2 10*3/uL (ref 0.7–4.0)
MCH: 30.6 pg (ref 26.0–34.0)
MCHC: 34.3 g/dL (ref 30.0–36.0)
MCV: 89.2 fL (ref 80.0–100.0)
Monocytes Absolute: 0.2 10*3/uL (ref 0.1–1.0)
Monocytes Relative: 9 %
Neutro Abs: 0.3 10*3/uL — ABNORMAL LOW (ref 1.7–7.7)
Neutrophils Relative %: 17 %
Platelets: 49 10*3/uL — ABNORMAL LOW (ref 150–400)
RBC: 2.97 MIL/uL — ABNORMAL LOW (ref 4.22–5.81)
RDW: 17.9 % — ABNORMAL HIGH (ref 11.5–15.5)
Smear Review: NORMAL
WBC: 1.7 10*3/uL — ABNORMAL LOW (ref 4.0–10.5)
nRBC: 0 % (ref 0.0–0.2)

## 2020-10-07 NOTE — Telephone Encounter (Signed)
ANC 0.3

## 2020-10-07 NOTE — Telephone Encounter (Signed)
Critical value called to Dr. Rogue Bussing by Jeanette Caprice at [phone]- read back process by Dr. Rogue Bussing. MD would like RN to reach out to patient to discuss neutropenic precautions and evaluate if patient has any neutropenic fevers. No need for colony stimulating injections at this time.

## 2020-10-08 NOTE — Telephone Encounter (Signed)
Left vm for patient to return my phone call.

## 2020-10-15 ENCOUNTER — Telehealth: Payer: Self-pay | Admitting: *Deleted

## 2020-10-15 NOTE — Telephone Encounter (Signed)
Patient called reporting that his eyes are itching and watering, running constantly and is asking for some eye drops to use for it. Please advise

## 2020-10-16 ENCOUNTER — Telehealth: Payer: Self-pay | Admitting: Internal Medicine

## 2020-10-16 NOTE — Telephone Encounter (Signed)
On 10/26-spoke to patient regarding his concerns for eyes tearing/itching.  Question allergies; less likely side effect of Keytruda.  Recommend Claritin; also antihistamine eyedrops OTC.  Patient in agreement.

## 2020-10-18 ENCOUNTER — Inpatient Hospital Stay: Payer: Medicare PPO

## 2020-10-18 ENCOUNTER — Other Ambulatory Visit: Payer: Self-pay

## 2020-10-18 ENCOUNTER — Encounter: Payer: Self-pay | Admitting: Internal Medicine

## 2020-10-18 ENCOUNTER — Inpatient Hospital Stay (HOSPITAL_BASED_OUTPATIENT_CLINIC_OR_DEPARTMENT_OTHER): Payer: Medicare PPO | Admitting: Internal Medicine

## 2020-10-18 DIAGNOSIS — Z5111 Encounter for antineoplastic chemotherapy: Secondary | ICD-10-CM | POA: Diagnosis not present

## 2020-10-18 DIAGNOSIS — C3431 Malignant neoplasm of lower lobe, right bronchus or lung: Secondary | ICD-10-CM

## 2020-10-18 LAB — CBC WITH DIFFERENTIAL/PLATELET
Abs Immature Granulocytes: 0.02 10*3/uL (ref 0.00–0.07)
Basophils Absolute: 0 10*3/uL (ref 0.0–0.1)
Basophils Relative: 0 %
Eosinophils Absolute: 0 10*3/uL (ref 0.0–0.5)
Eosinophils Relative: 0 %
HCT: 23.6 % — ABNORMAL LOW (ref 39.0–52.0)
Hemoglobin: 8.2 g/dL — ABNORMAL LOW (ref 13.0–17.0)
Immature Granulocytes: 1 %
Lymphocytes Relative: 47 %
Lymphs Abs: 1.4 10*3/uL (ref 0.7–4.0)
MCH: 31.4 pg (ref 26.0–34.0)
MCHC: 34.7 g/dL (ref 30.0–36.0)
MCV: 90.4 fL (ref 80.0–100.0)
Monocytes Absolute: 0.6 10*3/uL (ref 0.1–1.0)
Monocytes Relative: 21 %
Neutro Abs: 1 10*3/uL — ABNORMAL LOW (ref 1.7–7.7)
Neutrophils Relative %: 31 %
Platelets: 60 10*3/uL — ABNORMAL LOW (ref 150–400)
RBC: 2.61 MIL/uL — ABNORMAL LOW (ref 4.22–5.81)
RDW: 19.9 % — ABNORMAL HIGH (ref 11.5–15.5)
WBC: 3 10*3/uL — ABNORMAL LOW (ref 4.0–10.5)
nRBC: 1 % — ABNORMAL HIGH (ref 0.0–0.2)

## 2020-10-18 LAB — COMPREHENSIVE METABOLIC PANEL
ALT: 26 U/L (ref 0–44)
AST: 25 U/L (ref 15–41)
Albumin: 3.5 g/dL (ref 3.5–5.0)
Alkaline Phosphatase: 70 U/L (ref 38–126)
Anion gap: 10 (ref 5–15)
BUN: 23 mg/dL (ref 8–23)
CO2: 23 mmol/L (ref 22–32)
Calcium: 8.9 mg/dL (ref 8.9–10.3)
Chloride: 103 mmol/L (ref 98–111)
Creatinine, Ser: 1.25 mg/dL — ABNORMAL HIGH (ref 0.61–1.24)
GFR, Estimated: 59 mL/min — ABNORMAL LOW (ref >60)
Glucose, Bld: 177 mg/dL — ABNORMAL HIGH (ref 70–99)
Potassium: 3.7 mmol/L (ref 3.5–5.1)
Sodium: 136 mmol/L (ref 135–145)
Total Bilirubin: 0.9 mg/dL (ref 0.3–1.2)
Total Protein: 6.7 g/dL (ref 6.5–8.1)

## 2020-10-18 MED ORDER — SODIUM CHLORIDE 0.9 % IV SOLN
Freq: Once | INTRAVENOUS | Status: AC
  Filled 2020-10-18: qty 250

## 2020-10-18 MED ORDER — HEPARIN SOD (PORK) LOCK FLUSH 100 UNIT/ML IV SOLN
500.0000 [IU] | Freq: Once | INTRAVENOUS | Status: AC
  Administered 2020-10-18: 500 [IU] via INTRAVENOUS
  Filled 2020-10-18: qty 5

## 2020-10-18 MED ORDER — SODIUM CHLORIDE 0.9% FLUSH
10.0000 mL | Freq: Once | INTRAVENOUS | Status: AC
  Administered 2020-10-18: 10 mL via INTRAVENOUS
  Filled 2020-10-18: qty 10

## 2020-10-18 MED ORDER — PROCHLORPERAZINE MALEATE 10 MG PO TABS
10.0000 mg | ORAL_TABLET | Freq: Once | ORAL | Status: AC
  Administered 2020-10-18: 10 mg via ORAL
  Filled 2020-10-18: qty 1

## 2020-10-18 MED ORDER — HEPARIN SOD (PORK) LOCK FLUSH 100 UNIT/ML IV SOLN
INTRAVENOUS | Status: AC
  Filled 2020-10-18: qty 5

## 2020-10-18 MED ORDER — SODIUM CHLORIDE 0.9 % IV SOLN
200.0000 mg | Freq: Once | INTRAVENOUS | Status: AC
  Administered 2020-10-18: 200 mg via INTRAVENOUS
  Filled 2020-10-18: qty 8

## 2020-10-18 NOTE — Assessment & Plan Note (Addendum)
#   Lung cancer-non-small cell favor adeno ca; Stage IV.s/p Carboplatin and Alimta + keytruda cycle #3; OCT 6th CT- Partial response noted-of the right lower lobe mass; improvement of the mediastinal adenopathy and adrenal lesion. S/p 4 cycles- Carbo-Alimta-Keytruda.   # proceed with [place] today reviewed;  acceptable for treatment today-except for mild anemia/thrombocytopenia.  Discussed the plan maintenance Keytruda moving forward.  #Anemia thrombocytopenia hemoglobin 8.3; platelets 60s; asymptomatic okay to proceed with Keytruda.  # Tearing of the eyes/bilateral-likely secondary to chemotherapy; continue antihistamine/topical.  #Mild dizziness no orthostasis/anemia hemoglobin 8.3 recommend continued hydration  # CKD- III- GFR- 60- STABLE; continue PO hydration.  #Prognosis: Again reiterated that unfortunately his malignancy is unlikely to be cured.  Surgery unlikely to be a treatment option.  He will need to be on indefinite treatments.  However we can offer treatment holidays to help with travel.    # DISPOSITION: # Keytruda today # 10 days- labs- cbc/CMP # 3 week-MD; labs- cbc/cmp; Keytruda;TSH Dr.B  # I reviewed the blood work- with the patient in detail; also reviewed the imaging independently [as summarized above]; and with the patient in detail.   # 40 minutes face-to-face with the patient discussing the above plan of care; more than 50% of time spent on prognosis/ natural history; counseling and coordination.

## 2020-10-18 NOTE — Progress Notes (Signed)
[facility] CONSULT NOTE  Patient Care Team: Ezequiel Kayser, MD as PCP - General (Internal Medicine) Telford Nab, RN as Oncology Nurse Navigator  CHIEF COMPLAINTS/PURPOSE OF CONSULTATION: lung nodule/mass  #  Oncology History Overview Note  # July 2021- RLL- 9.4 cm x 7.5 cm x 6.7 cm lobulated low-attenuation heterogeneous lung mass within the posteromedial aspect of the right lower lobe, at the level of the right hilum; positive for hilar; mediastinal adenopathy; PET scan-July 2021 + for adrenal metastases; CT-guided biopsy-non-small cell favor adenocarcinoma; necrosis  # carbo-alimtac  [name]; NGS pending] cycle #2 carbo Alimta Keytruda.  # 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 CKD  # NGS/MOLECULAR TESTS: TPS =70%; rest **  # PALLIATIVE CARE EVALUATION:     DIAGNOSIS: Lung cancer  STAGE: 4       ;  GOALS: Palliative  CURRENT/MOST RECENT THERAPY : Carbo Alimta Keytruda    Cancer of lower lobe of right lung ([place])  07/05/2020 Initial Diagnosis   Cancer of lower lobe of right lung ([place])   07/26/2020 -  Chemotherapy   The patient had dexamethasone (DECADRON) 4 MG tablet, 1 of 1 cycle, Start date: --, End date: -- palonosetron (ALOXI) injection 0.25 mg, 0.25 mg, Intravenous,  Once, 4 of 4 cycles Administration: 0.25 mg (07/26/2020), 0.25 mg (09/06/2020), 0.25 mg (09/27/2020), 0.25 mg (08/16/2020) PEMEtrexed (ALIMTA) 1,100 mg in sodium chloride 0.9 % 100 mL chemo infusion, 500 mg/m2 = 1,100 mg, Intravenous,  Once, 4 of 4 cycles Administration: 1,100 mg (07/26/2020), 1,100 mg (09/06/2020), 1,100 mg (09/27/2020), 1,100 mg (08/16/2020) CARBOplatin (PARAPLATIN) 510 mg in sodium chloride 0.9 % 250 mL chemo infusion, 510 mg (100 % of original dose 513.5 mg), Intravenous,  Once, 4 of 4 cycles Dose modification:   (original dose 513.5 mg, Cycle 1) Administration: 510 mg (07/26/2020), 450 mg (09/06/2020), 520  mg (09/27/2020), 510 mg (08/16/2020) fosaprepitant (EMEND) 150 mg in sodium chloride 0.9 % 145 mL IVPB, 150 mg, Intravenous,  Once, 4 of 4 cycles Administration: 150 mg (07/26/2020), 150 mg (09/06/2020), 150 mg (09/27/2020), 150 mg (08/16/2020) pembrolizumab (KEYTRUDA) 200 mg in sodium chloride 0.9 % 50 mL chemo infusion, 200 mg, Intravenous, Once, 3 of 7 cycles Administration: 200 mg (09/06/2020), 200 mg (09/27/2020), 200 mg (08/16/2020)  for chemotherapy treatment.       HISTORY OF PRESENTING ILLNESS:  Ralph Williams 79 y.o.  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  Patient complains of tearing in the eyes bilaterally.  Stuffy nose.  Otherwise no new shortness of breath or cough.  Denies any headaches.  No diarrhea.  No rash.  Review of Systems  Constitutional: Negative for chills, diaphoresis, fever, malaise/fatigue and weight loss.  HENT: Negative for nosebleeds and sore throat.   Eyes: Negative for double vision.  Respiratory: Negative for hemoptysis, sputum production and shortness of breath.   Cardiovascular: Negative for chest pain, palpitations, orthopnea and leg swelling.  Gastrointestinal: Negative for abdominal pain, blood in stool, constipation, diarrhea, heartburn, melena, nausea and vomiting.  Genitourinary: Negative for dysuria, frequency and urgency.  Musculoskeletal: Negative for back pain and joint pain.  Skin: Negative.  Negative for itching and rash.  Neurological: Negative for dizziness, tingling, focal weakness, weakness and headaches.  Endo/Heme/Allergies: Does not bruise/bleed easily.  Psychiatric/Behavioral: Negative for depression. The patient is not nervous/anxious.  MEDICAL HISTORY:  Past Medical History:  Diagnosis Date  . Chronic renal insufficiency   . Diabetes mellitus without complication ([place])    Pt states it's diet controlled.  Marland Kitchen GERD (gastroesophageal reflux  disease)   . Hyperlipidemia associated with type 2 diabetes mellitus ([place])   . Hypertension   . Lung mass     SURGICAL HISTORY: Past Surgical History:  Procedure Laterality Date  . IR IMAGING GUIDED PORT INSERTION  07/22/2020  . lymphoma removal     back    SOCIAL HISTORY: Social History   Socioeconomic History  . Marital status: Married    Spouse name: Not on file  . Number of children: Not on file  . Years of education: Not on file  . Highest education level: Not on file  Occupational History  . Not on file  Tobacco Use  . Smoking status: Former Smoker    Quit date: 07/23/2007    Years since quitting: 13.2  . Smokeless tobacco: Never Used  Vaping Use  . Vaping Use: Never used  Substance and Sexual Activity  . Alcohol use: Not on file    Comment: rarely  . Drug use: Never  . Sexual activity: Not on file  Other Topics Concern  . Not on file  Social History Narrative   Moved from [place]; lives in [place] in 2021; daughter lives next door. Quit smoking- 13 years ago. Social alcohol. 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    Social Determinants of Health   Financial Resource Strain:   . Difficulty of Paying Living Expenses: Not on file  Food Insecurity:   . Worried About Charity fundraiser in the Last Year: Not on file  . Ran Out of Food in the Last Year: Not on file  Transportation Needs:   . Lack of Transportation (Medical): Not on file  . Lack of Transportation (Non-Medical): Not on file  Physical Activity:   . Days of Exercise per Week: Not on file  . Minutes of Exercise per Session: Not on file  Stress:   . Feeling of Stress : Not on file  Social Connections:   . Frequency of Communication with Friends and Family: Not on file  . Frequency of Social Gatherings with Friends and Family: Not on file  . Attends Religious Services: Not on file  . Active Member of Clubs or Organizations: Not on file  . Attends Archivist  Meetings: Not on file  . Marital Status: Not on file  Intimate Partner Violence:   . Fear of Current or Ex-Partner: Not on file  . Emotionally Abused: Not on file  . Physically Abused: Not on file  . Sexually Abused: Not on file    FAMILY HISTORY: Family History  Problem Relation Age of Onset  . Liver cancer Sister   . Cancer - Other Brother        bone cancer [2001]    ALLERGIES:  is allergic to niacin and related.  MEDICATIONS:  Current Outpatient Medications  Medication Sig Dispense Refill  . acetaminophen (TYLENOL) 500 MG tablet Take 2 tablets by mouth every 8 (eight) hours.     Marland Kitchen albuterol (VENTOLIN HFA) 108 (90 Base) MCG/ACT inhaler Inhale 2 puffs into the lungs in the morning, at noon, in the evening, and at bedtime.    Marland Kitchen aspirin 325 MG EC tablet Take 1 tablet by mouth daily.    Marland Kitchen atenolol (TENORMIN) 25 MG tablet Take 1 tablet by mouth daily.    Marland Kitchen  atorvastatin (LIPITOR) 40 MG tablet Take 1 tablet by mouth daily.    Marland Kitchen dexamethasone (DECADRON) 4 MG tablet Take one pill AM & PM; TAKE the day before & day after chemo; DO NOT Take on the day of chemo. ONLY for 2 days. 60 tablet 0  . folic acid (FOLVITE) 1 MG tablet Take 1 tablet (1 mg total) by mouth daily. 90 tablet 1  . hydrochlorothiazide (HYDRODIURIL) 12.5 MG tablet Take 12.5 mg by mouth daily.    Marland Kitchen lidocaine-prilocaine (EMLA) cream Apply 1 application topically as needed. 30 g 0  . losartan (COZAAR) 50 MG tablet Take 1 tablet by mouth daily.    . Melatonin 10 MG TABS Take 1 tablet by mouth at bedtime.    . Omega-3 1000 MG CAPS Take 1,000 mg by mouth in the morning and at bedtime.    Marland Kitchen omeprazole (PRILOSEC) 20 MG capsule Take 20 mg by mouth in the morning and at bedtime.    . ondansetron (ZOFRAN) 8 MG tablet One pill every 8 hours as needed for nausea/vomitting. 40 tablet 1  . prochlorperazine (COMPAZINE) 10 MG tablet Take 1 tablet (10 mg total) by mouth every 6 (six) hours as needed for nausea or vomiting. 40 tablet 1  .  tadalafil (CIALIS) 20 MG tablet Take 1 tablet by mouth daily.    . Zinc 22.5 MG TABS Take 22 mg by mouth daily.     No current facility-administered medications for this visit.   Facility-Administered Medications Ordered in Other Visits  Medication Dose Route Frequency Provider Last Rate Last Admin  . heparin lock flush 100 unit/mL  500 Units Intravenous Once Charlaine Dalton R, MD          .  PHYSICAL EXAMINATION: ECOG PERFORMANCE STATUS: 1 - Symptomatic but completely ambulatory  Vitals:   10/18/20 0837  BP: 115/65  Pulse: 81  Temp: (!) 96.2 F (35.7 C)   Filed Weights   10/18/20 0837  Weight: 235 lb (106.6 kg)    Physical Exam Constitutional:      Comments: Ambulating independently.  Accompanied by his wife.   HENT:     Head: Normocephalic and atraumatic.     Mouth/Throat:     Pharynx: No oropharyngeal exudate.  Eyes:     Pupils: Pupils are equal, round, and reactive to light.  Cardiovascular:     Rate and Rhythm: Normal rate and regular rhythm.  Pulmonary:     Effort: Pulmonary effort is normal. No respiratory distress.     Breath sounds: Normal breath sounds. No wheezing.  Abdominal:     General: Bowel sounds are normal. There is no distension.     Palpations: Abdomen is soft. There is no mass.     Tenderness: There is no abdominal tenderness. There is no guarding or rebound.  Musculoskeletal:        General: No tenderness. Normal range of motion.     Cervical back: Normal range of motion and neck supple.  Skin:    General: Skin is warm.  Neurological:     Mental Status: He is alert and oriented to person, place, and time.  Psychiatric:        Mood and Affect: Affect normal.      LABORATORY DATA:  I have reviewed the data as listed Lab Results  Component Value Date   WBC 3.0 (L) 10/18/2020   HGB 8.2 (L) 10/18/2020   HCT 23.6 (L) 10/18/2020   MCV 90.4 10/18/2020   PLT 60 (  L) 10/18/2020   Recent Labs    08/16/20 0802 08/27/20 1127  09/06/20 0806 09/06/20 0806 09/20/20 0846 09/20/20 0846 09/27/20 0821 10/07/20 1050 10/18/20 0821  NA   < > 134* 139   < > 144   < > 137 138 136  K   < > 4.4 4.7   < > 3.5   < > 4.0 4.0 3.7  CL   < > 99 106   < > 109   < > 104 107 103  CO2   < > 27 22   < > 25   < > 23 26 23   GLUCOSE   < > 115* 210*   < > 164*   < > 253* 210* 177*  BUN   < > 36* 31*   < > 24*   < > 24* 25* 23  CREATININE   < > 1.30* 1.37*   < > 1.29*   < > 1.15 1.27* 1.25*  CALCIUM   < > 8.1* 8.5*   < > 8.4*   < > 8.3* 8.4* 8.9  GFRNONAA   < > 53* 49*   < > 53*   < > >60 54* 59*  GFRAA  --  >60 57*  --  >60  --   --   --   --   PROT   < >  --  6.5   < > 6.4*   < > 7.1 6.5 6.7  ALBUMIN   < >  --  3.2*   < > 3.2*   < > 3.6 3.3* 3.5  AST   < >  --  39   < > 72*   < > 27 28 25   ALT   < >  --  117*   < > 106*   < > 39 31 26  ALKPHOS   < >  --  67   < > 68   < > 72 72 70  BILITOT   < >  --  0.8   < > 0.8   < > 1.0 0.7 0.9   < > = values in this interval not displayed.    RADIOGRAPHIC STUDIES: I have personally reviewed the radiological images as listed and agreed with the findings in the report. CT Chest W Contrast  Result Date: 09/23/2020 CLINICAL DATA:  Non-small-cell lung cancer.  Restaging. EXAM: CT CHEST WITH CONTRAST TECHNIQUE: Multidetector CT imaging of the chest was performed during intravenous contrast administration. CONTRAST:  54mL OMNIPAQUE IOHEXOL 300 MG/ML  SOLN COMPARISON:  PET-CT 07/11/2020.  Chest CT 06/28/2020. FINDINGS: Cardiovascular: The heart size is normal. No substantial pericardial effusion. Coronary artery calcification is evident. Atherosclerotic calcification is noted in the wall of the thoracic aorta. Right Port-A-Cath tip is in the distal SVC. Mediastinum/Nodes: No mediastinal lymphadenopathy. No evidence for gross hilar lymphadenopathy although assessment is limited by the lack of intravenous contrast on today's study. The esophagus has normal imaging features. There is no axillary  lymphadenopathy. Lungs/Pleura: Interval decrease in the posterior right lower lobe lung mass. Lesion was measured on PET-CT soft tissue windows on 07/11/2020 as 8.8 x 6.7 cm. Lesion is less confluent today and decreased in size measuring 5.6 x 4.1 cm on soft tissue windows (76/2). Mild volume loss again noted right lower lobe with inferior right lower lobe atelectasis. No new suspicious pulmonary nodule or mass. Calcified granuloma again noted anterior left upper lobe. No pleural effusion. Upper Abdomen: Stable 16 mm exophytic  lesion upper pole left kidney with attenuation higher than would be expected for a simple cyst. Additional bilateral lesions noted in the upper kidneys. Musculoskeletal: No worrisome lytic or sclerotic osseous abnormality. IMPRESSION: 1. Interval decrease in the right lower lobe neoplasm. No new or progressive findings on today's study. 2. Indeterminate lesions in the upper pole of each kidney, some of which have attenuation too high to be simple cysts. Continued close attention on follow-up recommended as neoplasm not excluded. 3. Aortic Atherosclerosis (ICD10-170.0) Electronically Signed   By: Misty Stanley M.D.   On: 09/23/2020 09:27    ASSESSMENT & PLAN:   Cancer of lower lobe of right lung ([place]) # Lung cancer-non-small cell favor adeno ca; Stage IV.s/p Carboplatin and Alimta + keytruda cycle #3; OCT 6th CT- Partial response noted-of the right lower lobe mass; improvement of the mediastinal adenopathy and adrenal lesion. S/p 4 cycles- Carbo-Alimta-Keytruda.   # proceed with [place] today reviewed;  acceptable for treatment today-except for mild anemia/thrombocytopenia.  Discussed the plan maintenance Keytruda moving forward.  #Anemia thrombocytopenia hemoglobin 8.3; platelets 60s; asymptomatic okay to proceed with Keytruda.  # Tearing of the eyes/bilateral-likely secondary to chemotherapy; continue antihistamine/topical.  #Mild dizziness no orthostasis/anemia  hemoglobin 8.3 recommend continued hydration  # CKD- III- GFR- 60- STABLE; continue PO hydration.  #Prognosis: Again reiterated that unfortunately his malignancy is unlikely to be cured.  Surgery unlikely to be a treatment option.  He will need to be on indefinite treatments.  However we can offer treatment holidays to help with travel.    # DISPOSITION: # Keytruda today # 10 days- labs- cbc/CMP # 3 week-MD; labs- cbc/cmp; Keytruda;TSH Dr.B  # I reviewed the blood work- with the patient in detail; also reviewed the imaging independently [as summarized above]; and with the patient in detail.   # 40 minutes face-to-face with the patient discussing the above plan of care; more than 50% of time spent on prognosis/ natural history; counseling and coordination.    All questions were answered. The patient knows to call the clinic with any problems, questions or concerns.    Cammie Sickle, MD 10/18/2020 9:23 AM

## 2020-10-18 NOTE — Progress Notes (Signed)
Per MD- ok to tx with ANC 1.0. treatment given as ordered. Completed without incident. Pt discharged stable. Return as scheduled .

## 2020-10-18 NOTE — Progress Notes (Signed)
  I completed a MOST form today. The patient and family outlined their wishes for the following treatment decisions:  Cardiopulmonary Resuscitation: Attempt Resuscitation (CPR)  Medical Interventions: Full Scope of Treatment: Use intubation, advanced airway interventions, mechanical ventilation, cardioversion as indicated, medical treatment, IV fluids, etc, also provide comfort measures. Transfer to the hospital if indicated  Antibiotics: Antibiotics if indicated  IV Fluids: IV fluids if indicated  Feeding Tube: Feeding tube long-term if indicated

## 2020-10-21 NOTE — Addendum Note (Signed)
Addended by: Delice Bison E on: 10/21/2020 09:22 AM   Modules accepted: Orders

## 2020-10-25 ENCOUNTER — Inpatient Hospital Stay: Payer: Medicare PPO | Attending: Hospice and Palliative Medicine | Admitting: Hospice and Palliative Medicine

## 2020-10-25 DIAGNOSIS — C3431 Malignant neoplasm of lower lobe, right bronchus or lung: Secondary | ICD-10-CM

## 2020-10-25 DIAGNOSIS — Z5112 Encounter for antineoplastic immunotherapy: Secondary | ICD-10-CM | POA: Insufficient documentation

## 2020-10-25 DIAGNOSIS — Z87891 Personal history of nicotine dependence: Secondary | ICD-10-CM | POA: Insufficient documentation

## 2020-10-25 DIAGNOSIS — Z515 Encounter for palliative care: Secondary | ICD-10-CM | POA: Diagnosis not present

## 2020-10-25 DIAGNOSIS — D696 Thrombocytopenia, unspecified: Secondary | ICD-10-CM | POA: Insufficient documentation

## 2020-10-25 DIAGNOSIS — C771 Secondary and unspecified malignant neoplasm of intrathoracic lymph nodes: Secondary | ICD-10-CM | POA: Insufficient documentation

## 2020-10-25 DIAGNOSIS — D649 Anemia, unspecified: Secondary | ICD-10-CM | POA: Insufficient documentation

## 2020-10-25 DIAGNOSIS — Z79899 Other long term (current) drug therapy: Secondary | ICD-10-CM | POA: Insufficient documentation

## 2020-10-25 DIAGNOSIS — I129 Hypertensive chronic kidney disease with stage 1 through stage 4 chronic kidney disease, or unspecified chronic kidney disease: Secondary | ICD-10-CM | POA: Insufficient documentation

## 2020-10-25 DIAGNOSIS — N183 Chronic kidney disease, stage 3 unspecified: Secondary | ICD-10-CM | POA: Insufficient documentation

## 2020-10-25 DIAGNOSIS — K219 Gastro-esophageal reflux disease without esophagitis: Secondary | ICD-10-CM | POA: Insufficient documentation

## 2020-10-25 DIAGNOSIS — C7970 Secondary malignant neoplasm of unspecified adrenal gland: Secondary | ICD-10-CM | POA: Insufficient documentation

## 2020-10-25 DIAGNOSIS — E1122 Type 2 diabetes mellitus with diabetic chronic kidney disease: Secondary | ICD-10-CM | POA: Insufficient documentation

## 2020-10-25 DIAGNOSIS — Z7982 Long term (current) use of aspirin: Secondary | ICD-10-CM | POA: Insufficient documentation

## 2020-10-25 NOTE — Progress Notes (Signed)
Virtual Visit via Video Note  I connected with Ralph Williams on 10/25/20 at 11:30 AM EDT by a video enabled telemedicine application and verified that I am speaking with the correct person using two identifiers.  Location: Patient: home Provider: clinic   I discussed the limitations of evaluation and management by telemedicine and the availability of in person appointments. The patient expressed understanding and agreed to proceed.  History of Present Illness: Ralph Williams is a 79 y.o. male with multiple medical problems including stage IV non-small cell lung cancer diagnosed July 2021 and is s/p systemic chemotherapy now on maintenance Keytruda.  Palliative care was referred to discuss goals and support patient for treatment.   Observations/Objective: I spoke with patient via MyChart today.  He reports that he is doing well.  He denies any significant or distressing symptoms.  He reports stable appetite.  He says he sleeps a lot but feels overall well rested.  He denies any issues with medications today nor did he require refills.  He had a question about influenza vaccine which we discussed.  Overall, he reports being comfortable with his treatment plan.  Assessment and Plan: Stage IV NSCLC -on maintenance Keytruda.  Seems to be tolerating well.  No significant symptoms today.  Patient is followed by Dr. Rogue Bussing with next clinic visit on 11/19.  Follow Up Instructions: Follow-up MyChart visit 2 to 3 months   I discussed the assessment and treatment plan with the patient. The patient was provided an opportunity to ask questions and all were answered. The patient agreed with the plan and demonstrated an understanding of the instructions.   The patient was advised to call back or seek an in-person evaluation if the symptoms worsen or if the condition fails to improve as anticipated.  I provided 15 minutes of non-face-to-face time during this encounter.   Irean Hong, NP

## 2020-10-28 ENCOUNTER — Inpatient Hospital Stay: Payer: Medicare PPO

## 2020-10-28 ENCOUNTER — Other Ambulatory Visit: Payer: Self-pay

## 2020-10-28 DIAGNOSIS — E1122 Type 2 diabetes mellitus with diabetic chronic kidney disease: Secondary | ICD-10-CM | POA: Diagnosis not present

## 2020-10-28 DIAGNOSIS — Z5112 Encounter for antineoplastic immunotherapy: Secondary | ICD-10-CM | POA: Diagnosis present

## 2020-10-28 DIAGNOSIS — K219 Gastro-esophageal reflux disease without esophagitis: Secondary | ICD-10-CM | POA: Diagnosis not present

## 2020-10-28 DIAGNOSIS — Z87891 Personal history of nicotine dependence: Secondary | ICD-10-CM | POA: Diagnosis not present

## 2020-10-28 DIAGNOSIS — C771 Secondary and unspecified malignant neoplasm of intrathoracic lymph nodes: Secondary | ICD-10-CM | POA: Diagnosis not present

## 2020-10-28 DIAGNOSIS — C7970 Secondary malignant neoplasm of unspecified adrenal gland: Secondary | ICD-10-CM | POA: Diagnosis not present

## 2020-10-28 DIAGNOSIS — Z7982 Long term (current) use of aspirin: Secondary | ICD-10-CM | POA: Diagnosis not present

## 2020-10-28 DIAGNOSIS — D696 Thrombocytopenia, unspecified: Secondary | ICD-10-CM | POA: Diagnosis not present

## 2020-10-28 DIAGNOSIS — D649 Anemia, unspecified: Secondary | ICD-10-CM | POA: Diagnosis not present

## 2020-10-28 DIAGNOSIS — C3431 Malignant neoplasm of lower lobe, right bronchus or lung: Secondary | ICD-10-CM | POA: Diagnosis present

## 2020-10-28 DIAGNOSIS — Z79899 Other long term (current) drug therapy: Secondary | ICD-10-CM | POA: Diagnosis not present

## 2020-10-28 DIAGNOSIS — N183 Chronic kidney disease, stage 3 unspecified: Secondary | ICD-10-CM | POA: Diagnosis not present

## 2020-10-28 DIAGNOSIS — I129 Hypertensive chronic kidney disease with stage 1 through stage 4 chronic kidney disease, or unspecified chronic kidney disease: Secondary | ICD-10-CM | POA: Diagnosis not present

## 2020-10-28 LAB — COMPREHENSIVE METABOLIC PANEL
ALT: 27 U/L (ref 0–44)
AST: 24 U/L (ref 15–41)
Albumin: 3.7 g/dL (ref 3.5–5.0)
Alkaline Phosphatase: 76 U/L (ref 38–126)
Anion gap: 8 (ref 5–15)
BUN: 24 mg/dL — ABNORMAL HIGH (ref 8–23)
CO2: 25 mmol/L (ref 22–32)
Calcium: 9.2 mg/dL (ref 8.9–10.3)
Chloride: 105 mmol/L (ref 98–111)
Creatinine, Ser: 1.19 mg/dL (ref 0.61–1.24)
GFR, Estimated: >60 mL/min (ref >60)
Glucose, Bld: 160 mg/dL — ABNORMAL HIGH (ref 70–99)
Potassium: 4.5 mmol/L (ref 3.5–5.1)
Sodium: 138 mmol/L (ref 135–145)
Total Bilirubin: 0.8 mg/dL (ref 0.3–1.2)
Total Protein: 7.1 g/dL (ref 6.5–8.1)

## 2020-10-28 LAB — CBC
HCT: 28.9 % — ABNORMAL LOW (ref 39.0–52.0)
Hemoglobin: 9.7 g/dL — ABNORMAL LOW (ref 13.0–17.0)
MCH: 32.7 pg (ref 26.0–34.0)
MCHC: 33.6 g/dL (ref 30.0–36.0)
MCV: 97.3 fL (ref 80.0–100.0)
Platelets: 145 10*3/uL — ABNORMAL LOW (ref 150–400)
RBC: 2.97 MIL/uL — ABNORMAL LOW (ref 4.22–5.81)
RDW: 23 % — ABNORMAL HIGH (ref 11.5–15.5)
WBC: 3.8 10*3/uL — ABNORMAL LOW (ref 4.0–10.5)
nRBC: 0 % (ref 0.0–0.2)

## 2020-11-08 ENCOUNTER — Encounter: Payer: Self-pay | Admitting: Internal Medicine

## 2020-11-08 ENCOUNTER — Other Ambulatory Visit: Payer: Self-pay

## 2020-11-08 ENCOUNTER — Inpatient Hospital Stay: Payer: Medicare PPO

## 2020-11-08 ENCOUNTER — Inpatient Hospital Stay (HOSPITAL_BASED_OUTPATIENT_CLINIC_OR_DEPARTMENT_OTHER): Payer: Medicare PPO | Admitting: Internal Medicine

## 2020-11-08 VITALS — BP 122/71 | HR 67 | Temp 97.5°F | Resp 18 | Wt 233.0 lb

## 2020-11-08 DIAGNOSIS — C3431 Malignant neoplasm of lower lobe, right bronchus or lung: Secondary | ICD-10-CM

## 2020-11-08 DIAGNOSIS — Z5112 Encounter for antineoplastic immunotherapy: Secondary | ICD-10-CM | POA: Diagnosis not present

## 2020-11-08 LAB — CBC WITH DIFFERENTIAL/PLATELET
Abs Immature Granulocytes: 0.02 10*3/uL (ref 0.00–0.07)
Basophils Absolute: 0 10*3/uL (ref 0.0–0.1)
Basophils Relative: 0 %
Eosinophils Absolute: 0 10*3/uL (ref 0.0–0.5)
Eosinophils Relative: 1 %
HCT: 32.2 % — ABNORMAL LOW (ref 39.0–52.0)
Hemoglobin: 10.9 g/dL — ABNORMAL LOW (ref 13.0–17.0)
Immature Granulocytes: 1 %
Lymphocytes Relative: 37 %
Lymphs Abs: 1.4 10*3/uL (ref 0.7–4.0)
MCH: 33.2 pg (ref 26.0–34.0)
MCHC: 33.9 g/dL (ref 30.0–36.0)
MCV: 98.2 fL (ref 80.0–100.0)
Monocytes Absolute: 0.6 10*3/uL (ref 0.1–1.0)
Monocytes Relative: 16 %
Neutro Abs: 1.7 10*3/uL (ref 1.7–7.7)
Neutrophils Relative %: 45 %
Platelets: 200 10*3/uL (ref 150–400)
RBC: 3.28 MIL/uL — ABNORMAL LOW (ref 4.22–5.81)
RDW: 20.7 % — ABNORMAL HIGH (ref 11.5–15.5)
WBC: 3.7 10*3/uL — ABNORMAL LOW (ref 4.0–10.5)
nRBC: 0 % (ref 0.0–0.2)

## 2020-11-08 LAB — COMPREHENSIVE METABOLIC PANEL
ALT: 22 U/L (ref 0–44)
AST: 27 U/L (ref 15–41)
Albumin: 3.7 g/dL (ref 3.5–5.0)
Alkaline Phosphatase: 76 U/L (ref 38–126)
Anion gap: 9 (ref 5–15)
BUN: 17 mg/dL (ref 8–23)
CO2: 25 mmol/L (ref 22–32)
Calcium: 9 mg/dL (ref 8.9–10.3)
Chloride: 104 mmol/L (ref 98–111)
Creatinine, Ser: 1.18 mg/dL (ref 0.61–1.24)
GFR, Estimated: >60 mL/min (ref >60)
Glucose, Bld: 151 mg/dL — ABNORMAL HIGH (ref 70–99)
Potassium: 4.1 mmol/L (ref 3.5–5.1)
Sodium: 138 mmol/L (ref 135–145)
Total Bilirubin: 0.9 mg/dL (ref 0.3–1.2)
Total Protein: 6.9 g/dL (ref 6.5–8.1)

## 2020-11-08 MED ORDER — SODIUM CHLORIDE 0.9 % IV SOLN
Freq: Once | INTRAVENOUS | Status: AC
  Filled 2020-11-08: qty 250

## 2020-11-08 MED ORDER — HEPARIN SOD (PORK) LOCK FLUSH 100 UNIT/ML IV SOLN
INTRAVENOUS | Status: AC
  Filled 2020-11-08: qty 5

## 2020-11-08 MED ORDER — PROCHLORPERAZINE MALEATE 10 MG PO TABS
10.0000 mg | ORAL_TABLET | Freq: Once | ORAL | Status: DC
  Filled 2020-11-08: qty 1

## 2020-11-08 MED ORDER — SODIUM CHLORIDE 0.9% FLUSH
10.0000 mL | INTRAVENOUS | Status: DC | PRN
  Administered 2020-11-08: 10 mL via INTRAVENOUS
  Filled 2020-11-08: qty 10

## 2020-11-08 MED ORDER — HEPARIN SOD (PORK) LOCK FLUSH 100 UNIT/ML IV SOLN
500.0000 [IU] | Freq: Once | INTRAVENOUS | Status: AC
  Administered 2020-11-08: 500 [IU] via INTRAVENOUS
  Filled 2020-11-08: qty 5

## 2020-11-08 MED ORDER — SODIUM CHLORIDE 0.9 % IV SOLN
200.0000 mg | Freq: Once | INTRAVENOUS | Status: AC
  Administered 2020-11-08: 200 mg via INTRAVENOUS
  Filled 2020-11-08: qty 8

## 2020-11-08 NOTE — Progress Notes (Signed)
[facility] NOTE  Patient Care Team: Ezequiel Kayser, MD as PCP - General (Internal Medicine) Telford Nab, RN as Oncology Nurse Navigator  CHIEF COMPLAINTS/PURPOSE OF CONSULTATION: lung nodule/mass  #  Oncology History Overview Note  # July 2021- RLL- 9.4 cm x 7.5 cm x 6.7 cm lobulated low-attenuation heterogeneous lung mass within the posteromedial aspect of the right lower lobe, at the level of the right hilum; positive for hilar; mediastinal adenopathy; PET scan-July 2021 + for adrenal metastases; CT-guided biopsy-non-small cell favor adenocarcinoma; necrosis  # carbo-alimtac  [name]; NGS pending] cycle #2 carbo Alimta [place]; 10/18/2020-maintenance Keytruda  # 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 CKD  # NGS/MOLECULAR TESTS: TPS =70%; rest **  # PALLIATIVE CARE EVALUATION:     DIAGNOSIS: Lung cancer  STAGE: 4       ;  GOALS: Palliative  CURRENT/MOST RECENT THERAPY : Carbo Alimta Keytruda    Cancer of lower lobe of right lung ([place])  07/05/2020 Initial Diagnosis   Cancer of lower lobe of right lung ([place])   07/26/2020 -  Chemotherapy   The patient had dexamethasone (DECADRON) 4 MG tablet, 1 of 1 cycle, Start date: --, End date: -- palonosetron (ALOXI) injection 0.25 mg, 0.25 mg, Intravenous,  Once, 4 of 4 cycles Administration: 0.25 mg (07/26/2020), 0.25 mg (09/06/2020), 0.25 mg (09/27/2020), 0.25 mg (08/16/2020) PEMEtrexed (ALIMTA) 1,100 mg in sodium chloride 0.9 % 100 mL chemo infusion, 500 mg/m2 = 1,100 mg, Intravenous,  Once, 4 of 4 cycles Administration: 1,100 mg (07/26/2020), 1,100 mg (09/06/2020), 1,100 mg (09/27/2020), 1,100 mg (08/16/2020) CARBOplatin (PARAPLATIN) 510 mg in sodium chloride 0.9 % 250 mL chemo infusion, 510 mg (100 % of original dose 513.5 mg), Intravenous,  Once, 4 of 4 cycles Dose modification:   (original dose 513.5 mg, Cycle 1) Administration: 510 mg  (07/26/2020), 450 mg (09/06/2020), 520 mg (09/27/2020), 510 mg (08/16/2020) fosaprepitant (EMEND) 150 mg in sodium chloride 0.9 % 145 mL IVPB, 150 mg, Intravenous,  Once, 4 of 4 cycles Administration: 150 mg (07/26/2020), 150 mg (09/06/2020), 150 mg (09/27/2020), 150 mg (08/16/2020) pembrolizumab (KEYTRUDA) 200 mg in sodium chloride 0.9 % 50 mL chemo infusion, 200 mg, Intravenous, Once, 5 of 8 cycles Administration: 200 mg (09/06/2020), 200 mg (09/27/2020), 200 mg (10/18/2020), 200 mg (11/08/2020), 200 mg (08/16/2020)  for chemotherapy treatment.       HISTORY OF PRESENTING ILLNESS:  Ralph Williams 79 y.o.  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  Patient states his bilateral eye itchiness/tearing is improved; using antihistamines as needed.  No new shortness of breath cough. No headaches. No rash. No diarrhea.  Review of Systems  Constitutional: Negative for chills, diaphoresis, fever, malaise/fatigue and weight loss.  HENT: Negative for nosebleeds and sore throat.   Eyes: Negative for double vision.  Respiratory: Negative for hemoptysis, sputum production and shortness of breath.   Cardiovascular: Negative for chest pain, palpitations, orthopnea and leg swelling.  Gastrointestinal: Negative for abdominal pain, blood in stool, constipation, diarrhea, heartburn, melena, nausea and vomiting.  Genitourinary: Negative for dysuria, frequency and urgency.  Musculoskeletal: Negative for back pain and joint pain.  Skin: Negative.  Negative for itching and rash.  Neurological: Negative for dizziness, tingling, focal weakness, weakness and headaches.  Endo/Heme/Allergies: Does not bruise/bleed easily.  Psychiatric/Behavioral: Negative for depression. The patient is not nervous/anxious.  MEDICAL HISTORY:  Past Medical History:  Diagnosis Date  . Chronic renal insufficiency   . Diabetes mellitus without complication  ([place])    Pt states it's diet controlled.  Marland Kitchen GERD (gastroesophageal reflux disease)   . Hyperlipidemia associated with type 2 diabetes mellitus ([place])   . Hypertension   . Lung mass     SURGICAL HISTORY: Past Surgical History:  Procedure Laterality Date  . IR IMAGING GUIDED PORT INSERTION  07/22/2020  . lymphoma removal     back    SOCIAL HISTORY: Social History   Socioeconomic History  . Marital status: Married    Spouse name: Not on file  . Number of children: Not on file  . Years of education: Not on file  . Highest education level: Not on file  Occupational History  . Not on file  Tobacco Use  . Smoking status: Former Smoker    Quit date: 07/23/2007    Years since quitting: 13.3  . Smokeless tobacco: Never Used  Vaping Use  . Vaping Use: Never used  Substance and Sexual Activity  . Alcohol use: Not on file    Comment: rarely  . Drug use: Never  . Sexual activity: Not on file  Other Topics Concern  . Not on file  Social History Narrative   Moved from [place]; lives in [place] in 2021; daughter lives next door. Quit smoking- 13 years ago. Social alcohol. 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    Social Determinants of Health   Financial Resource Strain:   . Difficulty of Paying Living Expenses: Not on file  Food Insecurity:   . Worried About Charity fundraiser in the Last Year: Not on file  . Ran Out of Food in the Last Year: Not on file  Transportation Needs:   . Lack of Transportation (Medical): Not on file  . Lack of Transportation (Non-Medical): Not on file  Physical Activity:   . Days of Exercise per Week: Not on file  . Minutes of Exercise per Session: Not on file  Stress:   . Feeling of Stress : Not on file  Social Connections:   . Frequency of Communication with Friends and Family: Not on file  . Frequency of Social Gatherings with Friends and Family: Not on file  . Attends Religious Services: Not on file  . Active Member  of Clubs or Organizations: Not on file  . Attends Archivist Meetings: Not on file  . Marital Status: Not on file  Intimate Partner Violence:   . Fear of Current or Ex-Partner: Not on file  . Emotionally Abused: Not on file  . Physically Abused: Not on file  . Sexually Abused: Not on file    FAMILY HISTORY: Family History  Problem Relation Age of Onset  . Liver cancer Sister   . Cancer - Other Brother        bone cancer [2001]    ALLERGIES:  is allergic to niacin and related.  MEDICATIONS:  Current Outpatient Medications  Medication Sig Dispense Refill  . acetaminophen (TYLENOL) 500 MG tablet Take 2 tablets by mouth every 8 (eight) hours.     Marland Kitchen albuterol (VENTOLIN HFA) 108 (90 Base) MCG/ACT inhaler Inhale 2 puffs into the lungs in the morning, at noon, in the evening, and at bedtime.    Marland Kitchen aspirin 325 MG EC tablet Take 1 tablet by mouth daily.    Marland Kitchen atenolol (TENORMIN) 25 MG tablet Take 1 tablet by mouth daily.    Marland Kitchen  atorvastatin (LIPITOR) 40 MG tablet Take 1 tablet by mouth daily.    . folic acid (FOLVITE) 1 MG tablet Take 1 tablet (1 mg total) by mouth daily. 90 tablet 1  . hydrochlorothiazide (HYDRODIURIL) 12.5 MG tablet Take 12.5 mg by mouth daily.    Marland Kitchen lidocaine-prilocaine (EMLA) cream Apply 1 application topically as needed. 30 g 0  . losartan (COZAAR) 50 MG tablet Take 1 tablet by mouth daily.    . Melatonin 10 MG TABS Take 1 tablet by mouth at bedtime.    . Omega-3 1000 MG CAPS Take 1,000 mg by mouth in the morning and at bedtime.    Marland Kitchen omeprazole (PRILOSEC) 20 MG capsule Take 20 mg by mouth in the morning and at bedtime.    . ondansetron (ZOFRAN) 8 MG tablet One pill every 8 hours as needed for nausea/vomitting. 40 tablet 1  . prochlorperazine (COMPAZINE) 10 MG tablet Take 1 tablet (10 mg total) by mouth every 6 (six) hours as needed for nausea or vomiting. 40 tablet 1  . tadalafil (CIALIS) 20 MG tablet Take 1 tablet by mouth daily.     No current  facility-administered medications for this visit.      Marland Kitchen  PHYSICAL EXAMINATION: ECOG PERFORMANCE STATUS: 1 - Symptomatic but completely ambulatory  Vitals:   11/08/20 0956  BP: 122/71  Pulse: 67  Resp: 18  Temp: (!) 97.5 F (36.4 C)  SpO2: 99%   Filed Weights   11/08/20 0956  Weight: 233 lb (105.7 kg)    Physical Exam Constitutional:      Comments: Ambulating independently.  Accompanied by his wife.   HENT:     Head: Normocephalic and atraumatic.     Mouth/Throat:     Pharynx: No oropharyngeal exudate.  Eyes:     Pupils: Pupils are equal, round, and reactive to light.  Cardiovascular:     Rate and Rhythm: Normal rate and regular rhythm.  Pulmonary:     Effort: Pulmonary effort is normal. No respiratory distress.     Breath sounds: Normal breath sounds. No wheezing.  Abdominal:     General: Bowel sounds are normal. There is no distension.     Palpations: Abdomen is soft. There is no mass.     Tenderness: There is no abdominal tenderness. There is no guarding or rebound.  Musculoskeletal:        General: No tenderness. Normal range of motion.     Cervical back: Normal range of motion and neck supple.  Skin:    General: Skin is warm.  Neurological:     Mental Status: He is alert and oriented to person, place, and time.  Psychiatric:        Mood and Affect: Affect normal.      LABORATORY DATA:  I have reviewed the data as listed Lab Results  Component Value Date   WBC 3.7 (L) 11/08/2020   HGB 10.9 (L) 11/08/2020   HCT 32.2 (L) 11/08/2020   MCV 98.2 11/08/2020   PLT 200 11/08/2020   Recent Labs    08/16/20 0802 08/27/20 1127 09/06/20 0806 09/06/20 0806 09/20/20 0846 09/27/20 0821 10/18/20 0821 10/28/20 1115 11/08/20 0927  NA   < > 134* 139   < > 144   < > 136 138 138  K   < > 4.4 4.7   < > 3.5   < > 3.7 4.5 4.1  CL   < > 99 106   < > 109   < >  103 105 104  CO2   < > 27 22   < > 25   < > 23 25 25   GLUCOSE   < > 115* 210*   < > 164*   < > 177*  160* 151*  BUN   < > 36* 31*   < > 24*   < > 23 24* 17  CREATININE   < > 1.30* 1.37*   < > 1.29*   < > 1.25* 1.19 1.18  CALCIUM   < > 8.1* 8.5*   < > 8.4*   < > 8.9 9.2 9.0  GFRNONAA   < > 53* 49*   < > 53*   < > 59* >60 >60  GFRAA  --  >60 57*  --  >60  --   --   --   --   PROT   < >  --  6.5   < > 6.4*   < > 6.7 7.1 6.9  ALBUMIN   < >  --  3.2*   < > 3.2*   < > 3.5 3.7 3.7  AST   < >  --  39   < > 72*   < > 25 24 27   ALT   < >  --  117*   < > 106*   < > 26 27 22   ALKPHOS   < >  --  67   < > 68   < > 70 76 76  BILITOT   < >  --  0.8   < > 0.8   < > 0.9 0.8 0.9   < > = values in this interval not displayed.    RADIOGRAPHIC STUDIES: I have personally reviewed the radiological images as listed and agreed with the findings in the report. No results found.  ASSESSMENT & PLAN:   Cancer of lower lobe of right lung ([place]) # Lung cancer-non-small cell favor adeno ca; Stage IV.s/p Carboplatin and Alimta + keytruda cycle #3; OCT 6th CT- Partial response noted-of the right lower lobe mass; improvement of the mediastinal adenopathy and adrenal lesion. S/p 4 cycles- Carbo-Alimta-Keytruda; currently on [facility].   # proceed with [place] today reviewed;  acceptable for treatment today.   #Anemia thrombocytopenia hemoglobin 10.9 ; platelets 150s- Normal asymptomatic- Improved  # Tearing of the eyes/bilateral-likely secondary to chemotherapy; continue antihistamine/topical; STABLE.   # CKD- III- GFR- 60- STABLE;  continue PO hydration.  Discussed that we'll try to arrange chemotherapy is around the time of his vacation./[place].  # DISPOSITION: # Keytruda today # 3 week-MD; labs- cbc/cmp; Keytruda; Dr.B    All questions were answered. The patient knows to call the clinic with any problems, questions or concerns.    Cammie Sickle, MD 11/10/2020 8:33 AM

## 2020-11-08 NOTE — Progress Notes (Signed)
Pt and wife in for follow up, pt doing well and denies any difficulties or concerns.

## 2020-11-08 NOTE — Assessment & Plan Note (Addendum)
#   Lung cancer-non-small cell favor adeno ca; Stage IV.s/p Carboplatin and Alimta + keytruda cycle #3; OCT 6th CT- Partial response noted-of the right lower lobe mass; improvement of the mediastinal adenopathy and adrenal lesion. S/p 4 cycles- Carbo-Alimta-Keytruda; currently on [facility].   # proceed with [place] today reviewed;  acceptable for treatment today.   #Anemia thrombocytopenia hemoglobin 10.9 ; platelets 150s- Normal asymptomatic- Improved  # Tearing of the eyes/bilateral-likely secondary to chemotherapy; continue antihistamine/topical; STABLE.   # CKD- III- GFR- 60- STABLE;  continue PO hydration.  Discussed that we'll try to arrange chemotherapy is around the time of his vacation./[place].  # DISPOSITION: # Keytruda today # 3 week-MD; labs- cbc/cmp; Keytruda; Dr.B

## 2020-11-29 ENCOUNTER — Other Ambulatory Visit: Payer: Self-pay

## 2020-11-29 ENCOUNTER — Inpatient Hospital Stay: Payer: Medicare PPO | Attending: Internal Medicine

## 2020-11-29 ENCOUNTER — Encounter: Payer: Self-pay | Admitting: Internal Medicine

## 2020-11-29 ENCOUNTER — Inpatient Hospital Stay (HOSPITAL_BASED_OUTPATIENT_CLINIC_OR_DEPARTMENT_OTHER): Payer: Medicare PPO | Admitting: Internal Medicine

## 2020-11-29 ENCOUNTER — Inpatient Hospital Stay: Payer: Medicare PPO

## 2020-11-29 VITALS — BP 114/63 | HR 68 | Temp 94.5°F | Resp 18 | Wt 231.2 lb

## 2020-11-29 VITALS — BP 98/58 | HR 63 | Resp 18

## 2020-11-29 DIAGNOSIS — K219 Gastro-esophageal reflux disease without esophagitis: Secondary | ICD-10-CM | POA: Insufficient documentation

## 2020-11-29 DIAGNOSIS — E785 Hyperlipidemia, unspecified: Secondary | ICD-10-CM | POA: Diagnosis not present

## 2020-11-29 DIAGNOSIS — E119 Type 2 diabetes mellitus without complications: Secondary | ICD-10-CM | POA: Insufficient documentation

## 2020-11-29 DIAGNOSIS — D649 Anemia, unspecified: Secondary | ICD-10-CM | POA: Diagnosis not present

## 2020-11-29 DIAGNOSIS — E279 Disorder of adrenal gland, unspecified: Secondary | ICD-10-CM | POA: Insufficient documentation

## 2020-11-29 DIAGNOSIS — I10 Essential (primary) hypertension: Secondary | ICD-10-CM | POA: Diagnosis not present

## 2020-11-29 DIAGNOSIS — Z87891 Personal history of nicotine dependence: Secondary | ICD-10-CM | POA: Insufficient documentation

## 2020-11-29 DIAGNOSIS — Z5112 Encounter for antineoplastic immunotherapy: Secondary | ICD-10-CM | POA: Insufficient documentation

## 2020-11-29 DIAGNOSIS — C3431 Malignant neoplasm of lower lobe, right bronchus or lung: Secondary | ICD-10-CM | POA: Diagnosis present

## 2020-11-29 DIAGNOSIS — N183 Chronic kidney disease, stage 3 unspecified: Secondary | ICD-10-CM | POA: Diagnosis not present

## 2020-11-29 DIAGNOSIS — Z7982 Long term (current) use of aspirin: Secondary | ICD-10-CM | POA: Insufficient documentation

## 2020-11-29 DIAGNOSIS — Z79899 Other long term (current) drug therapy: Secondary | ICD-10-CM | POA: Insufficient documentation

## 2020-11-29 LAB — CBC WITH DIFFERENTIAL/PLATELET
Abs Immature Granulocytes: 0.01 10*3/uL (ref 0.00–0.07)
Basophils Absolute: 0 10*3/uL (ref 0.0–0.1)
Basophils Relative: 1 %
Eosinophils Absolute: 0.1 10*3/uL (ref 0.0–0.5)
Eosinophils Relative: 2 %
HCT: 35 % — ABNORMAL LOW (ref 39.0–52.0)
Hemoglobin: 11.8 g/dL — ABNORMAL LOW (ref 13.0–17.0)
Immature Granulocytes: 0 %
Lymphocytes Relative: 34 %
Lymphs Abs: 1.5 10*3/uL (ref 0.7–4.0)
MCH: 33.7 pg (ref 26.0–34.0)
MCHC: 33.7 g/dL (ref 30.0–36.0)
MCV: 100 fL (ref 80.0–100.0)
Monocytes Absolute: 0.6 10*3/uL (ref 0.1–1.0)
Monocytes Relative: 14 %
Neutro Abs: 2.2 10*3/uL (ref 1.7–7.7)
Neutrophils Relative %: 49 %
Platelets: 191 10*3/uL (ref 150–400)
RBC: 3.5 MIL/uL — ABNORMAL LOW (ref 4.22–5.81)
RDW: 14.1 % (ref 11.5–15.5)
WBC: 4.4 10*3/uL (ref 4.0–10.5)
nRBC: 0 % (ref 0.0–0.2)

## 2020-11-29 LAB — COMPREHENSIVE METABOLIC PANEL
ALT: 21 U/L (ref 0–44)
AST: 22 U/L (ref 15–41)
Albumin: 3.7 g/dL (ref 3.5–5.0)
Alkaline Phosphatase: 78 U/L (ref 38–126)
Anion gap: 11 (ref 5–15)
BUN: 27 mg/dL — ABNORMAL HIGH (ref 8–23)
CO2: 24 mmol/L (ref 22–32)
Calcium: 9.2 mg/dL (ref 8.9–10.3)
Chloride: 104 mmol/L (ref 98–111)
Creatinine, Ser: 1.14 mg/dL (ref 0.61–1.24)
GFR, Estimated: >60 mL/min (ref >60)
Glucose, Bld: 109 mg/dL — ABNORMAL HIGH (ref 70–99)
Potassium: 3.7 mmol/L (ref 3.5–5.1)
Sodium: 139 mmol/L (ref 135–145)
Total Bilirubin: 0.9 mg/dL (ref 0.3–1.2)
Total Protein: 7 g/dL (ref 6.5–8.1)

## 2020-11-29 LAB — TSH: TSH: 1.266 u[IU]/mL (ref 0.350–4.500)

## 2020-11-29 MED ORDER — CYANOCOBALAMIN 1000 MCG/ML IJ SOLN: 1000.0000 ug | Freq: Once | INTRAMUSCULAR | Status: DC

## 2020-11-29 MED ORDER — HEPARIN SOD (PORK) LOCK FLUSH 100 UNIT/ML IV SOLN
INTRAVENOUS | Status: AC
  Filled 2020-11-29: qty 5

## 2020-11-29 MED ORDER — SODIUM CHLORIDE 0.9 % IV SOLN
Freq: Once | INTRAVENOUS | Status: AC
  Filled 2020-11-29: qty 250

## 2020-11-29 MED ORDER — HEPARIN SOD (PORK) LOCK FLUSH 100 UNIT/ML IV SOLN
500.0000 [IU] | Freq: Once | INTRAVENOUS | Status: DC | PRN
  Filled 2020-11-29: qty 5

## 2020-11-29 MED ORDER — HEPARIN SOD (PORK) LOCK FLUSH 100 UNIT/ML IV SOLN
500.0000 [IU] | Freq: Once | INTRAVENOUS | Status: AC
  Administered 2020-11-29: 500 [IU] via INTRAVENOUS
  Filled 2020-11-29: qty 5

## 2020-11-29 MED ORDER — SODIUM CHLORIDE 0.9 % IV SOLN
200.0000 mg | Freq: Once | INTRAVENOUS | Status: AC
  Administered 2020-11-29: 200 mg via INTRAVENOUS
  Filled 2020-11-29: qty 8

## 2020-11-29 MED ORDER — SODIUM CHLORIDE 0.9% FLUSH
10.0000 mL | Freq: Once | INTRAVENOUS | Status: AC
  Administered 2020-11-29: 10 mL via INTRAVENOUS
  Filled 2020-11-29: qty 10

## 2020-11-29 MED ORDER — PROCHLORPERAZINE MALEATE 10 MG PO TABS
10.0000 mg | ORAL_TABLET | Freq: Once | ORAL | Status: DC
  Filled 2020-11-29: qty 1

## 2020-11-29 NOTE — Progress Notes (Signed)
[place] NOTE  Patient Care Team: Ezequiel Kayser, MD as PCP - General (Internal Medicine) Telford Nab, RN as Oncology Nurse Navigator  CHIEF COMPLAINTS/PURPOSE OF CONSULTATION: lung nodule/mass  #  Oncology History Overview Note  # July 2021- RLL- 9.4 cm x 7.5 cm x 6.7 cm lobulated low-attenuation heterogeneous lung mass within the posteromedial aspect of the right lower lobe, at the level of the right hilum; positive for hilar; mediastinal adenopathy; PET scan-July 2021 + for adrenal metastases; CT-guided biopsy-non-small cell favor adenocarcinoma; necrosis  # carbo-alimtac  [name]; NGS pending] cycle #2 carbo Alimta [place]; 10/18/2020-maintenance Keytruda  # 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 CKD  # NGS/MOLECULAR TESTS: TPS =70%; rest **  # PALLIATIVE CARE EVALUATION:     DIAGNOSIS: Lung cancer  STAGE: 4       ;  GOALS: Palliative  CURRENT/MOST RECENT THERAPY : Carbo Alimta Keytruda    Cancer of lower lobe of right lung ([place])  07/05/2020 Initial Diagnosis   Cancer of lower lobe of right lung ([place])   07/26/2020 -  Chemotherapy   The patient had dexamethasone (DECADRON) 4 MG tablet, 1 of 1 cycle, Start date: --, End date: -- palonosetron (ALOXI) injection 0.25 mg, 0.25 mg, Intravenous,  Once, 4 of 4 cycles Administration: 0.25 mg (07/26/2020), 0.25 mg (09/06/2020), 0.25 mg (09/27/2020), 0.25 mg (08/16/2020) PEMEtrexed (ALIMTA) 1,100 mg in sodium chloride 0.9 % 100 mL chemo infusion, 500 mg/m2 = 1,100 mg, Intravenous,  Once, 4 of 4 cycles Administration: 1,100 mg (07/26/2020), 1,100 mg (09/06/2020), 1,100 mg (09/27/2020), 1,100 mg (08/16/2020) CARBOplatin (PARAPLATIN) 510 mg in sodium chloride 0.9 % 250 mL chemo infusion, 510 mg (100 % of original dose 513.5 mg), Intravenous,  Once, 4 of 4 cycles Dose modification:   (original dose 513.5 mg, Cycle 1) Administration: 510 mg  (07/26/2020), 450 mg (09/06/2020), 520 mg (09/27/2020), 510 mg (08/16/2020) fosaprepitant (EMEND) 150 mg in sodium chloride 0.9 % 145 mL IVPB, 150 mg, Intravenous,  Once, 4 of 4 cycles Administration: 150 mg (07/26/2020), 150 mg (09/06/2020), 150 mg (09/27/2020), 150 mg (08/16/2020) pembrolizumab (KEYTRUDA) 200 mg in sodium chloride 0.9 % 50 mL chemo infusion, 200 mg, Intravenous, Once, 6 of 10 cycles Administration: 200 mg (09/06/2020), 200 mg (09/27/2020), 200 mg (10/18/2020), 200 mg (11/08/2020), 200 mg (08/16/2020), 200 mg (11/29/2020)  for chemotherapy treatment.       HISTORY OF PRESENTING ILLNESS:  Alexander Morash 79 y.o.  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  Patient denies any unusual shortness of breath or cough.  No swelling in the legs.  Nausea vomiting.  No headaches.  No rash.  No diarrhea.  Review of Systems  Constitutional: Negative for chills, diaphoresis, fever, malaise/fatigue and weight loss.  HENT: Negative for nosebleeds and sore throat.   Eyes: Negative for double vision.  Respiratory: Negative for hemoptysis, sputum production and shortness of breath.   Cardiovascular: Negative for chest pain, palpitations, orthopnea and leg swelling.  Gastrointestinal: Negative for abdominal pain, blood in stool, constipation, diarrhea, heartburn, melena, nausea and vomiting.  Genitourinary: Negative for dysuria, frequency and urgency.  Musculoskeletal: Negative for back pain and joint pain.  Skin: Negative.  Negative for itching and rash.  Neurological: Negative for dizziness, tingling, focal weakness, weakness and headaches.  Endo/Heme/Allergies: Does not bruise/bleed easily.  Psychiatric/Behavioral: Negative for depression. The patient  is not nervous/anxious.      MEDICAL HISTORY:  Past Medical History:  Diagnosis Date  . Chronic renal insufficiency   . Diabetes mellitus without complication ([place])     Pt states it's diet controlled.  Marland Kitchen GERD (gastroesophageal reflux disease)   . Hyperlipidemia associated with type 2 diabetes mellitus ([place])   . Hypertension   . Lung mass     SURGICAL HISTORY: Past Surgical History:  Procedure Laterality Date  . IR IMAGING GUIDED PORT INSERTION  07/22/2020  . lymphoma removal     back    SOCIAL HISTORY: Social History   Socioeconomic History  . Marital status: Married    Spouse name: Not on file  . Number of children: Not on file  . Years of education: Not on file  . Highest education level: Not on file  Occupational History  . Not on file  Tobacco Use  . Smoking status: Former Smoker    Quit date: 07/23/2007    Years since quitting: 13.3  . Smokeless tobacco: Never Used  Vaping Use  . Vaping Use: Never used  Substance and Sexual Activity  . Alcohol use: Not on file    Comment: rarely  . Drug use: Never  . Sexual activity: Not on file  Other Topics Concern  . Not on file  Social History Narrative   Moved from [place]; lives in [place] in 2021; daughter lives next door. Quit smoking- 13 years ago. Social alcohol. 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    Social Determinants of Health   Financial Resource Strain: Not on file  Food Insecurity: Not on file  Transportation Needs: Not on file  Physical Activity: Not on file  Stress: Not on file  Social Connections: Not on file  Intimate Partner Violence: Not on file    FAMILY HISTORY: Family History  Problem Relation Age of Onset  . Liver cancer Sister   . Cancer - Other Brother        bone cancer [2001]    ALLERGIES:  is allergic to niacin and related.  MEDICATIONS:  Current Outpatient Medications  Medication Sig Dispense Refill  . acetaminophen (TYLENOL) 500 MG tablet Take 2 tablets by mouth every 8 (eight) hours.     Marland Kitchen aspirin 325 MG EC tablet Take 1 tablet by mouth daily.    Marland Kitchen atenolol (TENORMIN) 25 MG tablet Take 1 tablet by mouth daily.     Marland Kitchen atorvastatin (LIPITOR) 40 MG tablet Take 1 tablet by mouth daily.    . folic acid (FOLVITE) 1 MG tablet Take 1 tablet (1 mg total) by mouth daily. 90 tablet 1  . hydrochlorothiazide (HYDRODIURIL) 12.5 MG tablet Take 12.5 mg by mouth daily.    Marland Kitchen lidocaine-prilocaine (EMLA) cream Apply 1 application topically as needed. 30 g 0  . losartan (COZAAR) 50 MG tablet Take 1 tablet by mouth daily.    . Melatonin 10 MG TABS Take 1 tablet by mouth at bedtime.    . Omega-3 1000 MG CAPS Take 1,000 mg by mouth in the morning and at bedtime.    Marland Kitchen omeprazole (PRILOSEC) 20 MG capsule Take 20 mg by mouth in the morning and at bedtime.    . ondansetron (ZOFRAN) 8 MG tablet One pill every 8 hours as needed for nausea/vomitting. 40 tablet 1  . prochlorperazine (COMPAZINE) 10 MG tablet Take 1 tablet (10 mg total) by mouth every 6 (six) hours as needed for nausea or vomiting. 40 tablet 1  . tadalafil (  CIALIS) 20 MG tablet Take 1 tablet by mouth daily.    Marland Kitchen albuterol (VENTOLIN HFA) 108 (90 Base) MCG/ACT inhaler Inhale 2 puffs into the lungs in the morning, at noon, in the evening, and at bedtime. (Patient not taking: Reported on 11/29/2020)     No current facility-administered medications for this visit.   Facility-Administered Medications Ordered in Other Visits  Medication Dose Route Frequency Provider Last Rate Last Admin  . heparin lock flush 100 unit/mL  500 Units Intracatheter Once PRN Cammie Sickle, MD      . prochlorperazine (COMPAZINE) tablet 10 mg  10 mg Oral Once Cammie Sickle, MD          .  PHYSICAL EXAMINATION: ECOG PERFORMANCE STATUS: 1 - Symptomatic but completely ambulatory  Vitals:   11/29/20 0929  BP: 114/63  Pulse: 68  Resp: 18  Temp: (!) 94.5 F (34.7 C)  SpO2: 95%   Filed Weights   11/29/20 0929  Weight: 231 lb 3.2 oz (104.9 kg)    Physical Exam Constitutional:      Comments: Ambulating independently.  Accompanied by his wife.   HENT:     Head:  Normocephalic and atraumatic.     Mouth/Throat:     Pharynx: No oropharyngeal exudate.  Eyes:     Pupils: Pupils are equal, round, and reactive to light.  Cardiovascular:     Rate and Rhythm: Normal rate and regular rhythm.  Pulmonary:     Effort: Pulmonary effort is normal. No respiratory distress.     Breath sounds: Normal breath sounds. No wheezing.  Abdominal:     General: Bowel sounds are normal. There is no distension.     Palpations: Abdomen is soft. There is no mass.     Tenderness: There is no abdominal tenderness. There is no guarding or rebound.  Musculoskeletal:        General: No tenderness. Normal range of motion.     Cervical back: Normal range of motion and neck supple.  Skin:    General: Skin is warm.  Neurological:     Mental Status: He is alert and oriented to person, place, and time.  Psychiatric:        Mood and Affect: Affect normal.      LABORATORY DATA:  I have reviewed the data as listed Lab Results  Component Value Date   WBC 4.4 11/29/2020   HGB 11.8 (L) 11/29/2020   HCT 35.0 (L) 11/29/2020   MCV 100.0 11/29/2020   PLT 191 11/29/2020   Recent Labs    08/27/20 1127 09/06/20 0806 09/20/20 0846 09/27/20 0821 10/28/20 1115 11/08/20 0927 11/29/20 0900  NA 134* 139 144   < > 138 138 139  K 4.4 4.7 3.5   < > 4.5 4.1 3.7  CL 99 106 109   < > 105 104 104  CO2 27 22 25    < > 25 25 24   GLUCOSE 115* 210* 164*   < > 160* 151* 109*  BUN 36* 31* 24*   < > 24* 17 27*  CREATININE 1.30* 1.37* 1.29*   < > 1.19 1.18 1.14  CALCIUM 8.1* 8.5* 8.4*   < > 9.2 9.0 9.2  GFRNONAA 53* 49* 53*   < > >60 >60 >60  GFRAA >60 57* >60  --   --   --   --   PROT  --  6.5 6.4*   < > 7.1 6.9 7.0  ALBUMIN  --  3.2* 3.2*   < >  3.7 3.7 3.7  AST  --  39 72*   < > 24 27 22   ALT  --  117* 106*   < > 27 22 21   ALKPHOS  --  67 68   < > 76 76 78  BILITOT  --  0.8 0.8   < > 0.8 0.9 0.9   < > = values in this interval not displayed.    RADIOGRAPHIC STUDIES: I have  personally reviewed the radiological images as listed and agreed with the findings in the report. No results found.  ASSESSMENT & PLAN:   Cancer of lower lobe of right lung ([place]) # Lung cancer-non-small cell favor adeno ca; Stage IV.OCT 6th CT- Partial response noted-of the right lower lobe mass; improvement of the mediastinal adenopathy and adrenal lesion; currently on [facility].   # proceed with Keytruda maintenance Labs today reviewed;  acceptable for treatment today.  Again reviewed with the patient/wife that unfortunate treatments are palliative and not curative.  #Anemia thrombocytopenia hemoglobin 10.9 ; platelets 150s- Normal asymptomatic- Improved  # CKD- III- GFR- 60- STABLE;  continue PO hydration.  # DISPOSITION: # Keytruda today # Jan 3rd- X-MD; labs- cbc/cmp  Keytruda; CT scan prior-Dr.B    All questions were answered. The patient knows to call the clinic with any problems, questions or concerns.    Cammie Sickle, MD 11/29/2020 5:17 PM

## 2020-11-29 NOTE — Assessment & Plan Note (Addendum)
#   Lung cancer-non-small cell favor adeno ca; Stage IV.OCT 6th CT- Partial response noted-of the right lower lobe mass; improvement of the mediastinal adenopathy and adrenal lesion; currently on [facility].   # proceed with Keytruda maintenance Labs today reviewed;  acceptable for treatment today.  Again reviewed with the patient/wife that unfortunate treatments are palliative and not curative.  #Anemia thrombocytopenia hemoglobin 10.9 ; platelets 150s- Normal asymptomatic- Improved  # CKD- III- GFR- 60- STABLE;  continue PO hydration.  # DISPOSITION: # Keytruda today # Jan 3rd- X-MD; labs- cbc/cmp  Keytruda; CT scan prior-Dr.B

## 2020-11-29 NOTE — Progress Notes (Signed)
Patient received prescribed treatment in clinic. Tolerated well. Patient stable at discharge. 

## 2020-11-29 NOTE — Progress Notes (Signed)
Patient here for oncology follow-up appointment, expresses concerns of nail discoloration, constipation and urinary urgency.

## 2020-12-19 ENCOUNTER — Ambulatory Visit
Admission: RE | Admit: 2020-12-19 | Discharge: 2020-12-19 | Disposition: A | Discharge status: Home / Self Care | Payer: Medicare PPO | Source: Ambulatory Visit | Attending: Internal Medicine | Admitting: Internal Medicine

## 2020-12-19 ENCOUNTER — Other Ambulatory Visit: Payer: Self-pay

## 2020-12-19 DIAGNOSIS — C3431 Malignant neoplasm of lower lobe, right bronchus or lung: Secondary | ICD-10-CM | POA: Diagnosis present

## 2020-12-19 MED ORDER — IOHEXOL 300 MG/ML  SOLN
75.0000 mL | Freq: Once | INTRAMUSCULAR | Status: AC | PRN
  Administered 2020-12-19: 15:00:00 75 mL via INTRAVENOUS

## 2020-12-23 ENCOUNTER — Encounter: Payer: Self-pay | Admitting: Oncology

## 2020-12-23 ENCOUNTER — Other Ambulatory Visit: Payer: Self-pay

## 2020-12-23 ENCOUNTER — Inpatient Hospital Stay: Payer: Medicare PPO

## 2020-12-23 ENCOUNTER — Inpatient Hospital Stay: Payer: Medicare PPO | Attending: Oncology

## 2020-12-23 ENCOUNTER — Inpatient Hospital Stay (HOSPITAL_BASED_OUTPATIENT_CLINIC_OR_DEPARTMENT_OTHER): Payer: Medicare PPO | Admitting: Oncology

## 2020-12-23 VITALS — BP 131/78 | HR 59 | Temp 97.6°F | Wt 236.3 lb

## 2020-12-23 DIAGNOSIS — Z79899 Other long term (current) drug therapy: Secondary | ICD-10-CM | POA: Diagnosis not present

## 2020-12-23 DIAGNOSIS — I129 Hypertensive chronic kidney disease with stage 1 through stage 4 chronic kidney disease, or unspecified chronic kidney disease: Secondary | ICD-10-CM | POA: Insufficient documentation

## 2020-12-23 DIAGNOSIS — E119 Type 2 diabetes mellitus without complications: Secondary | ICD-10-CM | POA: Diagnosis not present

## 2020-12-23 DIAGNOSIS — C3431 Malignant neoplasm of lower lobe, right bronchus or lung: Secondary | ICD-10-CM

## 2020-12-23 DIAGNOSIS — E785 Hyperlipidemia, unspecified: Secondary | ICD-10-CM | POA: Diagnosis not present

## 2020-12-23 DIAGNOSIS — C7972 Secondary malignant neoplasm of left adrenal gland: Secondary | ICD-10-CM | POA: Insufficient documentation

## 2020-12-23 DIAGNOSIS — Z87891 Personal history of nicotine dependence: Secondary | ICD-10-CM | POA: Insufficient documentation

## 2020-12-23 DIAGNOSIS — Z5112 Encounter for antineoplastic immunotherapy: Secondary | ICD-10-CM | POA: Diagnosis not present

## 2020-12-23 DIAGNOSIS — C7971 Secondary malignant neoplasm of right adrenal gland: Secondary | ICD-10-CM | POA: Diagnosis not present

## 2020-12-23 DIAGNOSIS — I2510 Atherosclerotic heart disease of native coronary artery without angina pectoris: Secondary | ICD-10-CM | POA: Diagnosis not present

## 2020-12-23 DIAGNOSIS — Z7982 Long term (current) use of aspirin: Secondary | ICD-10-CM | POA: Diagnosis not present

## 2020-12-23 DIAGNOSIS — K219 Gastro-esophageal reflux disease without esophagitis: Secondary | ICD-10-CM | POA: Diagnosis not present

## 2020-12-23 DIAGNOSIS — N189 Chronic kidney disease, unspecified: Secondary | ICD-10-CM | POA: Insufficient documentation

## 2020-12-23 DIAGNOSIS — E1122 Type 2 diabetes mellitus with diabetic chronic kidney disease: Secondary | ICD-10-CM | POA: Diagnosis not present

## 2020-12-23 LAB — CBC WITH DIFFERENTIAL/PLATELET
Abs Immature Granulocytes: 0.01 10*3/uL (ref 0.00–0.07)
Basophils Absolute: 0 10*3/uL (ref 0.0–0.1)
Basophils Relative: 0 %
Eosinophils Absolute: 0.1 10*3/uL (ref 0.0–0.5)
Eosinophils Relative: 3 %
HCT: 35.6 % — ABNORMAL LOW (ref 39.0–52.0)
Hemoglobin: 12.2 g/dL — ABNORMAL LOW (ref 13.0–17.0)
Immature Granulocytes: 0 %
Lymphocytes Relative: 41 %
Lymphs Abs: 1.7 10*3/uL (ref 0.7–4.0)
MCH: 33.7 pg (ref 26.0–34.0)
MCHC: 34.3 g/dL (ref 30.0–36.0)
MCV: 98.3 fL (ref 80.0–100.0)
Monocytes Absolute: 0.4 10*3/uL (ref 0.1–1.0)
Monocytes Relative: 9 %
Neutro Abs: 2 10*3/uL (ref 1.7–7.7)
Neutrophils Relative %: 47 %
Platelets: 134 10*3/uL — ABNORMAL LOW (ref 150–400)
RBC: 3.62 MIL/uL — ABNORMAL LOW (ref 4.22–5.81)
RDW: 11.9 % (ref 11.5–15.5)
WBC: 4.1 10*3/uL (ref 4.0–10.5)
nRBC: 0 % (ref 0.0–0.2)

## 2020-12-23 LAB — COMPREHENSIVE METABOLIC PANEL
ALT: 25 U/L (ref 0–44)
AST: 26 U/L (ref 15–41)
Albumin: 3.5 g/dL (ref 3.5–5.0)
Alkaline Phosphatase: 70 U/L (ref 38–126)
Anion gap: 11 (ref 5–15)
BUN: 20 mg/dL (ref 8–23)
CO2: 22 mmol/L (ref 22–32)
Calcium: 8.8 mg/dL — ABNORMAL LOW (ref 8.9–10.3)
Chloride: 105 mmol/L (ref 98–111)
Creatinine, Ser: 1.14 mg/dL (ref 0.61–1.24)
GFR, Estimated: >60 mL/min (ref >60)
Glucose, Bld: 177 mg/dL — ABNORMAL HIGH (ref 70–99)
Potassium: 3.8 mmol/L (ref 3.5–5.1)
Sodium: 138 mmol/L (ref 135–145)
Total Bilirubin: 0.6 mg/dL (ref 0.3–1.2)
Total Protein: 6.7 g/dL (ref 6.5–8.1)

## 2020-12-23 MED ORDER — SODIUM CHLORIDE 0.9 % IV SOLN
200.0000 mg | Freq: Once | INTRAVENOUS | Status: AC
  Administered 2020-12-23: 200 mg via INTRAVENOUS
  Filled 2020-12-23: qty 8

## 2020-12-23 MED ORDER — SODIUM CHLORIDE 0.9 % IV SOLN
Freq: Once | INTRAVENOUS | Status: AC
  Filled 2020-12-23: qty 250

## 2020-12-23 MED ORDER — HEPARIN SOD (PORK) LOCK FLUSH 100 UNIT/ML IV SOLN
INTRAVENOUS | Status: AC
  Filled 2020-12-23: qty 5

## 2020-12-23 MED ORDER — HEPARIN SOD (PORK) LOCK FLUSH 100 UNIT/ML IV SOLN
500.0000 [IU] | Freq: Once | INTRAVENOUS | Status: AC | PRN
  Administered 2020-12-23: 500 [IU]
  Filled 2020-12-23: qty 5

## 2020-12-23 MED ORDER — CYANOCOBALAMIN 1000 MCG/ML IJ SOLN: 1000.0000 ug | Freq: Once | INTRAMUSCULAR | Status: DC

## 2020-12-23 MED ORDER — PROCHLORPERAZINE MALEATE 10 MG PO TABS: 10.0000 mg | ORAL_TABLET | Freq: Once | ORAL | Status: DC

## 2020-12-23 NOTE — Progress Notes (Signed)
Hematology/Oncology Consult note [facility]  [phone] [phone]  Patient Care Team: Ezequiel Kayser, MD as PCP - General (Internal Medicine) Telford Nab, RN as Oncology Nurse Navigator   Name of the patient: Rollin Kotowski  [id number]  78-Apr-1942   Date of visit: 12/23/20  Diagnosis-stage IV lung cancer  Chief complaint/ Reason for visit-on treatment assessment prior to next cycle of Keytruda  Heme/Onc history:  Oncology History Overview Note  # July 2021- RLL- 9.4 cm x 7.5 cm x 6.7 cm lobulated low-attenuation heterogeneous lung mass within the posteromedial aspect of the right lower lobe, at the level of the right hilum; positive for hilar; mediastinal adenopathy; PET scan-July 2021 + for adrenal metastases; CT-guided biopsy-non-small cell favor adenocarcinoma; necrosis  # carbo-alimtac  [name]; NGS pending] cycle #2 carbo Alimta Keytruda; 10/18/2020-maintenance Keytruda  # 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 CKD  # NGS/MOLECULAR TESTS: TPS =70%; rest **  # PALLIATIVE CARE EVALUATION:     DIAGNOSIS: Lung cancer  STAGE: 4       ;  GOALS: Palliative  CURRENT/MOST RECENT THERAPY : Carbo Alimta Keytruda    Cancer of lower lobe of right lung ([place])  07/05/2020 Initial Diagnosis   Cancer of lower lobe of right lung ([place])   07/26/2020 -  Chemotherapy   The patient had dexamethasone (DECADRON) 4 MG tablet, 1 of 1 cycle, Start date: --, End date: -- palonosetron (ALOXI) injection 0.25 mg, 0.25 mg, Intravenous,  Once, 4 of 4 cycles Administration: 0.25 mg (07/26/2020), 0.25 mg (09/06/2020), 0.25 mg (09/27/2020), 0.25 mg (08/16/2020) PEMEtrexed (ALIMTA) 1,100 mg in sodium chloride 0.9 % 100 mL chemo infusion, 500 mg/m2 = 1,100 mg, Intravenous,  Once, 4 of 4 cycles Administration: 1,100 mg (07/26/2020), 1,100 mg (09/06/2020), 1,100 mg (09/27/2020), 1,100 mg  (08/16/2020) CARBOplatin (PARAPLATIN) 510 mg in sodium chloride 0.9 % 250 mL chemo infusion, 510 mg (100 % of original dose 513.5 mg), Intravenous,  Once, 4 of 4 cycles Dose modification:   (original dose 513.5 mg, Cycle 1) Administration: 510 mg (07/26/2020), 450 mg (09/06/2020), 520 mg (09/27/2020), 510 mg (08/16/2020) fosaprepitant (EMEND) 150 mg in sodium chloride 0.9 % 145 mL IVPB, 150 mg, Intravenous,  Once, 4 of 4 cycles Administration: 150 mg (07/26/2020), 150 mg (09/06/2020), 150 mg (09/27/2020), 150 mg (08/16/2020) pembrolizumab (KEYTRUDA) 200 mg in sodium chloride 0.9 % 50 mL chemo infusion, 200 mg, Intravenous, Once, 6 of 10 cycles Administration: 200 mg (09/06/2020), 200 mg (09/27/2020), 200 mg (10/18/2020), 200 mg (11/08/2020), 200 mg (08/16/2020), 200 mg (11/29/2020)  for chemotherapy treatment.       Interval history-patient is tolerating Keytruda well without any significant side effects.  Has baseline fatigue but denies other complaints  ECOG PS- 1 Pain scale- 0 Opioid associated constipation- no  Review of systems- Review of Systems  Constitutional: Positive for malaise/fatigue. Negative for chills, fever and weight loss.  HENT: Negative for congestion, ear discharge and nosebleeds.   Eyes: Negative for blurred vision.  Respiratory: Negative for cough, hemoptysis, sputum production, shortness of breath and wheezing.   Cardiovascular: Negative for chest pain, palpitations, orthopnea and claudication.  Gastrointestinal: Negative for abdominal pain, blood in stool, constipation, diarrhea, heartburn, melena, nausea and vomiting.  Genitourinary: Negative for dysuria, flank pain, frequency, hematuria and urgency.  Musculoskeletal: Negative for back pain, joint pain and myalgias.  Skin: Negative for rash.  Neurological: Negative for dizziness,  tingling, focal weakness, seizures, weakness and headaches.  Endo/Heme/Allergies: Does not bruise/bleed easily.  Psychiatric/Behavioral: Negative  for depression and suicidal ideas. The patient does not have insomnia.       Allergies  Allergen Reactions  . Niacin And Related Rash     Past Medical History:  Diagnosis Date  . Chronic renal insufficiency   . Diabetes mellitus without complication ([place])    Pt states it's diet controlled.  Marland Kitchen GERD (gastroesophageal reflux disease)   . Hyperlipidemia associated with type 2 diabetes mellitus ([place])   . Hypertension   . Lung mass   . Non-small cell lung cancer, right ([place]) 06/2020     Past Surgical History:  Procedure Laterality Date  . IR IMAGING GUIDED PORT INSERTION  07/22/2020  . lymphoma removal     back    Social History   Socioeconomic History  . Marital status: Married    Spouse name: Not on file  . Number of children: Not on file  . Years of education: Not on file  . Highest education level: Not on file  Occupational History  . Not on file  Tobacco Use  . Smoking status: Former Smoker    Quit date: 07/23/2007    Years since quitting: 13.4  . Smokeless tobacco: Never Used  Vaping Use  . Vaping Use: Never used  Substance and Sexual Activity  . Alcohol use: Not on file    Comment: rarely  . Drug use: Never  . Sexual activity: Not on file  Other Topics Concern  . Not on file  Social History Narrative   Moved from [place]; lives in [place] in 2021; daughter lives next door. Quit smoking- 13 years ago. Social alcohol. 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    Social Determinants of Health   Financial Resource Strain: Not on file  Food Insecurity: Not on file  Transportation Needs: Not on file  Physical Activity: Not on file  Stress: Not on file  Social Connections: Not on file  Intimate Partner Violence: Not on file    Family History  Problem Relation Age of Onset  . Liver cancer Sister   . Cancer - Other Brother        bone cancer [2001]     Current Outpatient Medications:  .  acetaminophen (TYLENOL) 500 MG tablet,  Take 2 tablets by mouth every 8 (eight) hours. , Disp: , Rfl:  .  albuterol (VENTOLIN HFA) 108 (90 Base) MCG/ACT inhaler, Inhale 2 puffs into the lungs in the morning, at noon, in the evening, and at bedtime. (Patient not taking: Reported on 11/29/2020), Disp: , Rfl:  .  aspirin 325 MG EC tablet, Take 1 tablet by mouth daily., Disp: , Rfl:  .  atenolol (TENORMIN) 25 MG tablet, Take 1 tablet by mouth daily., Disp: , Rfl:  .  atorvastatin (LIPITOR) 40 MG tablet, Take 1 tablet by mouth daily., Disp: , Rfl:  .  folic acid (FOLVITE) 1 MG tablet, Take 1 tablet (1 mg total) by mouth daily., Disp: 90 tablet, Rfl: 1 .  hydrochlorothiazide (HYDRODIURIL) 12.5 MG tablet, Take 12.5 mg by mouth daily., Disp: , Rfl:  .  lidocaine-prilocaine (EMLA) cream, Apply 1 application topically as needed., Disp: 30 g, Rfl: 0 .  losartan (COZAAR) 50 MG tablet, Take 1 tablet by mouth daily., Disp: , Rfl:  .  Melatonin 10 MG TABS, Take 1 tablet by mouth at bedtime., Disp: , Rfl:  .  Omega-3 1000 MG CAPS, Take 1,000  mg by mouth in the morning and at bedtime., Disp: , Rfl:  .  omeprazole (PRILOSEC) 20 MG capsule, Take 20 mg by mouth in the morning and at bedtime., Disp: , Rfl:  .  ondansetron (ZOFRAN) 8 MG tablet, One pill every 8 hours as needed for nausea/vomitting., Disp: 40 tablet, Rfl: 1 .  prochlorperazine (COMPAZINE) 10 MG tablet, Take 1 tablet (10 mg total) by mouth every 6 (six) hours as needed for nausea or vomiting., Disp: 40 tablet, Rfl: 1 .  tadalafil (CIALIS) 20 MG tablet, Take 1 tablet by mouth daily., Disp: , Rfl:   Physical exam: There were no vitals filed for this visit. Physical Exam Eyes:     Extraocular Movements: EOM normal.  Cardiovascular:     Rate and Rhythm: Normal rate and regular rhythm.     Heart sounds: Normal heart sounds.  Pulmonary:     Effort: Pulmonary effort is normal.     Breath sounds: Normal breath sounds.  Skin:    General: Skin is warm and dry.  Neurological:     Mental Status:  He is alert and oriented to person, place, and time.      CMP Latest Ref Rng & Units 11/29/2020  Glucose 70 - 99 mg/dL 109(H)  BUN 8 - 23 mg/dL 27(H)  Creatinine 0.61 - 1.24 mg/dL 1.14  Sodium 135 - 145 mmol/L 139  Potassium 3.5 - 5.1 mmol/L 3.7  Chloride 98 - 111 mmol/L 104  CO2 22 - 32 mmol/L 24  Calcium 8.9 - 10.3 mg/dL 9.2  Total Protein 6.5 - 8.1 g/dL 7.0  Total Bilirubin 0.3 - 1.2 mg/dL 0.9  Alkaline Phos 38 - 126 U/L 78  AST 15 - 41 U/L 22  ALT 0 - 44 U/L 21   CBC Latest Ref Rng & Units 12/23/2020  WBC 4.0 - 10.5 K/uL 4.1  Hemoglobin 13.0 - 17.0 g/dL 12.2(L)  Hematocrit 39.0 - 52.0 % 35.6(L)  Platelets 150 - 400 K/uL 134(L)    No images are attached to the encounter.  CT Chest W Contrast  Result Date: 12/19/2020 CLINICAL DATA:  80 year old male with history of non-small cell lung cancer diagnosed in July 2021. Chemotherapy completed. Currently taking Keytruda. Evaluate for treatment response. EXAM: CT CHEST WITH CONTRAST TECHNIQUE: Multidetector CT imaging of the chest was performed during intravenous contrast administration. CONTRAST:  70mL OMNIPAQUE IOHEXOL 300 MG/ML  SOLN COMPARISON:  Chest CT 09/23/2020. FINDINGS: Cardiovascular: Heart size is normal. There is no significant pericardial fluid, thickening or pericardial calcification. There is aortic atherosclerosis, as well as atherosclerosis of the great vessels of the mediastinum and the coronary arteries, including calcified atherosclerotic plaque in the left main, left anterior descending, left circumflex and right coronary arteries. Right internal jugular single-lumen porta cath with tip terminating at the superior cavoatrial junction. Mediastinum/Nodes: No pathologically enlarged mediastinal or hilar lymph nodes. Esophagus is unremarkable in appearance. No axillary lymphadenopathy. Lungs/Pleura: Previously treated right lower lobe mass continues to show signs of regression, estimated to measure approximately 4.1 x 2.9 x  4.8 cm on today's study (axial image 77 of series 3 and coronal image 116 of series 5). Surrounding architectural distortion and volume loss predominantly in the right lower lobe, compatible with evolving postradiation mass-like fibrosis. Calcified granuloma in the anterior aspect of the left upper lobe. No other definite suspicious appearing pulmonary nodules or masses are noted. No acute consolidative airspace disease. No pleural effusions. Upper Abdomen: Multiple low-attenuation lesions in both kidneys compatible with simple cysts,  largest of which is exophytic in the upper pole the left kidney measuring 3.1 cm in diameter. In the anterior aspect of the upper pole the left kidney (axial image 164 of series 3) there is also a 2.1 x 1.7 cm intermediate attenuation (50 HU) lesion which is indeterminate. Aortic atherosclerosis. Musculoskeletal: There are no aggressive appearing lytic or blastic lesions noted in the visualized portions of the skeleton. IMPRESSION: 1. Continued positive response to therapy with slight regression in size of central lesion in the superior segment of the right lower lobe, with continued evolution of adjacent postradiation mass-like fibrosis. 2. Aortic atherosclerosis, in addition to left main and 3 vessel coronary artery disease. Assessment for potential risk factor modification, dietary therapy or pharmacologic therapy may be warranted, if clinically indicated. 3. Indeterminate lesion in the anterior aspect of the upper pole the left kidney. This may simply represent a proteinaceous/hemorrhagic cyst. However, further evaluation with nonemergent abdominal MRI with and without IV gadolinium is recommended in the near future to better characterize this finding. Aortic Atherosclerosis (ICD10-I70.0). Electronically Signed   By: Vinnie Langton M.D.   On: 12/19/2020 19:48     Assessment and plan- Patient is a 80 y.o. 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  Plan is to continue Keytruda until progression or toxicity  Counts okay to proceed with next cycle of Keytruda today.  Port labs CBC with differential, CMP and see Dr. Rogue Bussing in 3 weeks and gets Keytruda.  Patient has baseline anemia with a hemoglobin around 10 and platelets which are mildly low and overall stable.  Likely secondary to prior chemotherapy.  Continue to monitor.  His hemoglobin today is improved to 12.2   Visit Diagnosis 1. Encounter for antineoplastic immunotherapy   2. Cancer of lower lobe of right lung [facility])      Dr. Randa Evens, MD, MPH [facility] [phone] 12/23/2020 12:41 PM

## 2020-12-23 NOTE — Progress Notes (Signed)
Pt has npo c/o.

## 2020-12-23 NOTE — Progress Notes (Signed)
Pt tolerated infusion well with no complications. VSS. Pt stable for discharge.   [name] [employer]

## 2020-12-27 ENCOUNTER — Inpatient Hospital Stay (HOSPITAL_BASED_OUTPATIENT_CLINIC_OR_DEPARTMENT_OTHER): Payer: Medicare PPO | Admitting: Hospice and Palliative Medicine

## 2020-12-27 ENCOUNTER — Other Ambulatory Visit: Payer: Self-pay

## 2020-12-27 DIAGNOSIS — Z515 Encounter for palliative care: Secondary | ICD-10-CM

## 2020-12-27 NOTE — Progress Notes (Signed)
Was unable to reach patient for scheduled MyChart visit.  Voicemail left.  Will reschedule.

## 2021-01-13 ENCOUNTER — Inpatient Hospital Stay (HOSPITAL_BASED_OUTPATIENT_CLINIC_OR_DEPARTMENT_OTHER): Payer: Medicare PPO | Admitting: Internal Medicine

## 2021-01-13 ENCOUNTER — Inpatient Hospital Stay: Payer: Medicare PPO

## 2021-01-13 ENCOUNTER — Other Ambulatory Visit: Payer: Self-pay

## 2021-01-13 ENCOUNTER — Encounter: Payer: Self-pay | Admitting: Internal Medicine

## 2021-01-13 DIAGNOSIS — N281 Cyst of kidney, acquired: Secondary | ICD-10-CM

## 2021-01-13 DIAGNOSIS — C3431 Malignant neoplasm of lower lobe, right bronchus or lung: Secondary | ICD-10-CM

## 2021-01-13 DIAGNOSIS — Z5112 Encounter for antineoplastic immunotherapy: Secondary | ICD-10-CM | POA: Diagnosis not present

## 2021-01-13 LAB — CBC WITH DIFFERENTIAL/PLATELET
Abs Immature Granulocytes: 0 10*3/uL (ref 0.00–0.07)
Basophils Absolute: 0 10*3/uL (ref 0.0–0.1)
Basophils Relative: 0 %
Eosinophils Absolute: 0.1 10*3/uL (ref 0.0–0.5)
Eosinophils Relative: 2 %
HCT: 38 % — ABNORMAL LOW (ref 39.0–52.0)
Hemoglobin: 13.2 g/dL (ref 13.0–17.0)
Immature Granulocytes: 0 %
Lymphocytes Relative: 42 %
Lymphs Abs: 1.9 10*3/uL (ref 0.7–4.0)
MCH: 33.2 pg (ref 26.0–34.0)
MCHC: 34.7 g/dL (ref 30.0–36.0)
MCV: 95.5 fL (ref 80.0–100.0)
Monocytes Absolute: 0.4 10*3/uL (ref 0.1–1.0)
Monocytes Relative: 10 %
Neutro Abs: 2.1 10*3/uL (ref 1.7–7.7)
Neutrophils Relative %: 46 %
Platelets: 158 10*3/uL (ref 150–400)
RBC: 3.98 MIL/uL — ABNORMAL LOW (ref 4.22–5.81)
RDW: 11.6 % (ref 11.5–15.5)
WBC: 4.5 10*3/uL (ref 4.0–10.5)
nRBC: 0 % (ref 0.0–0.2)

## 2021-01-13 LAB — COMPREHENSIVE METABOLIC PANEL
ALT: 35 U/L (ref 0–44)
AST: 29 U/L (ref 15–41)
Albumin: 3.7 g/dL (ref 3.5–5.0)
Alkaline Phosphatase: 79 U/L (ref 38–126)
Anion gap: 9 (ref 5–15)
BUN: 34 mg/dL — ABNORMAL HIGH (ref 8–23)
CO2: 24 mmol/L (ref 22–32)
Calcium: 8.9 mg/dL (ref 8.9–10.3)
Chloride: 103 mmol/L (ref 98–111)
Creatinine, Ser: 1.57 mg/dL — ABNORMAL HIGH (ref 0.61–1.24)
GFR, Estimated: 45 mL/min — ABNORMAL LOW (ref >60)
Glucose, Bld: 144 mg/dL — ABNORMAL HIGH (ref 70–99)
Potassium: 3.9 mmol/L (ref 3.5–5.1)
Sodium: 136 mmol/L (ref 135–145)
Total Bilirubin: 0.5 mg/dL (ref 0.3–1.2)
Total Protein: 7.1 g/dL (ref 6.5–8.1)

## 2021-01-13 MED ORDER — PROCHLORPERAZINE MALEATE 10 MG PO TABS: 10.0000 mg | ORAL_TABLET | Freq: Four times a day (QID) | ORAL | 1 refills | Status: AC | PRN

## 2021-01-13 MED ORDER — SODIUM CHLORIDE 0.9 % IV SOLN
200.0000 mg | Freq: Once | INTRAVENOUS | Status: AC
  Administered 2021-01-13: 200 mg via INTRAVENOUS
  Filled 2021-01-13: qty 8

## 2021-01-13 MED ORDER — HEPARIN SOD (PORK) LOCK FLUSH 100 UNIT/ML IV SOLN
INTRAVENOUS | Status: AC
  Filled 2021-01-13: qty 5

## 2021-01-13 MED ORDER — HEPARIN SOD (PORK) LOCK FLUSH 100 UNIT/ML IV SOLN
500.0000 [IU] | Freq: Once | INTRAVENOUS | Status: AC | PRN
  Administered 2021-01-13: 500 [IU]
  Filled 2021-01-13: qty 5

## 2021-01-13 MED ORDER — SODIUM CHLORIDE 0.9 % IV SOLN
Freq: Once | INTRAVENOUS | Status: AC
  Filled 2021-01-13: qty 250

## 2021-01-13 MED ORDER — PROCHLORPERAZINE MALEATE 10 MG PO TABS: 10.0000 mg | ORAL_TABLET | Freq: Four times a day (QID) | ORAL | 1 refills | Status: DC | PRN

## 2021-01-13 NOTE — Progress Notes (Signed)
Cr 1.57 ok to proceed per md

## 2021-01-13 NOTE — Progress Notes (Signed)
[place] NOTE  Patient Care Team: Ezequiel Kayser, MD as PCP - General (Internal Medicine) Telford Nab, RN as Oncology Nurse Navigator  CHIEF COMPLAINTS/PURPOSE OF CONSULTATION: lung nodule/mass  #  Oncology History Overview Note  # July 2021- RLL- 9.4 cm x 7.5 cm x 6.7 cm lobulated low-attenuation heterogeneous lung mass within the posteromedial aspect of the right lower lobe, at the level of the right hilum; positive for hilar; mediastinal adenopathy; PET scan-July 2021 + for adrenal metastases; CT-guided biopsy-non-small cell favor adenocarcinoma; necrosis  # carbo-alimtac  [name]; NGS pending] cycle #2 carbo Alimta Navarro; 10/18/2020-maintenance Keytruda  # 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 CKD  # NGS/MOLECULAR TESTS: TPS =70%; rest **  # PALLIATIVE CARE EVALUATION:     DIAGNOSIS: Lung cancer  STAGE: 4       ;  GOALS: Palliative  CURRENT/MOST RECENT THERAPY : Carbo Alimta Keytruda    Cancer of lower lobe of right lung ([place])  07/05/2020 Initial Diagnosis   Cancer of lower lobe of right lung ([place])   07/26/2020 -  Chemotherapy    Patient is on Treatment Plan: LUNG CARBOPLATIN / PEMETREXED / PEMBROLIZUMAB Q21D INDUCTION X 4 CYCLES / MAINTENANCE PEMETREXED + PEMBROLIZUMAB         HISTORY OF PRESENTING ILLNESS:  Ralph Williams 80 y.o.  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  Patient had a good holiday with family in [place].  Patient denies any unusual shortness of breath or cough.  No swelling of the legs.  No diarrhea.  No skin rash.  Energy levels are adequate.  Review of Systems  Constitutional: Negative for chills, diaphoresis, fever, malaise/fatigue and weight loss.  HENT: Negative for nosebleeds and sore throat.   Eyes: Negative for  double vision.  Respiratory: Negative for hemoptysis, sputum production and shortness of breath.   Cardiovascular: Negative for chest pain, palpitations, orthopnea and leg swelling.  Gastrointestinal: Negative for abdominal pain, blood in stool, constipation, diarrhea, heartburn, melena, nausea and vomiting.  Genitourinary: Negative for dysuria, frequency and urgency.  Musculoskeletal: Negative for back pain and joint pain.  Skin: Negative.  Negative for itching and rash.  Neurological: Negative for dizziness, tingling, focal weakness, weakness and headaches.  Endo/Heme/Allergies: Does not bruise/bleed easily.  Psychiatric/Behavioral: Negative for depression. The patient is not nervous/anxious.      MEDICAL HISTORY:  Past Medical History:  Diagnosis Date   Chronic renal insufficiency    Diabetes mellitus without complication ([place])    Pt states it's diet controlled.   GERD (gastroesophageal reflux disease)    Hyperlipidemia associated with type 2 diabetes mellitus (HCC)    Hypertension    Lung mass    Non-small cell lung cancer, right ([place]) 06/2020    SURGICAL HISTORY: Past Surgical History:  Procedure Laterality Date   IR IMAGING GUIDED PORT INSERTION  07/22/2020   lymphoma removal     back    SOCIAL HISTORY: Social History   Socioeconomic History   Marital status: Married    Spouse name: Not on file   Number of children: Not on file   Years of education: Not on file   Highest education level: Not on file  Occupational History   Not on file  Tobacco Use   Smoking status: Former Smoker    Quit date:  07/23/2007    Years since quitting: 13.4   Smokeless tobacco: Never Used  Vaping Use   Vaping Use: Never used  Substance and Sexual Activity   Alcohol use: Not Currently    Comment: none for 5 months   Drug use: Never   Sexual activity: Not on file  Other Topics Concern   Not on file  Social History Narrative   Moved from [place]; lives in [place] in 2021; daughter lives next door. Quit smoking- 13 years ago. Social alcohol. 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    Social Determinants of Health   Financial Resource Strain: Not on file  Food Insecurity: Not on file  Transportation Needs: Not on file  Physical Activity: Not on file  Stress: Not on file  Social Connections: Not on file  Intimate Partner Violence: Not on file    FAMILY HISTORY: Family History  Problem Relation Age of Onset   Liver cancer Sister    Cancer - Other Brother        bone cancer [2001]    ALLERGIES:  is allergic to niacin and related.  MEDICATIONS:  Current Outpatient Medications  Medication Sig Dispense Refill   acetaminophen (TYLENOL) 500 MG tablet Take 2 tablets by mouth every 8 (eight) hours as needed.     aspirin 325 MG EC tablet Take 1 tablet by mouth daily.     atenolol (TENORMIN) 25 MG tablet Take 1 tablet by mouth daily.     atorvastatin (LIPITOR) 40 MG tablet Take 1 tablet by mouth daily.     hydrochlorothiazide (HYDRODIURIL) 12.5 MG tablet Take 12.5 mg by mouth daily.     lidocaine-prilocaine (EMLA) cream Apply 1 application topically as needed. 30 g 0   losartan (COZAAR) 50 MG tablet Take 1 tablet by mouth daily.     Melatonin 10 MG TABS Take 1 tablet by mouth at bedtime.     Omega-3 1000 MG CAPS Take 1,000 mg by mouth in the morning and at bedtime.     omeprazole (PRILOSEC) 20 MG capsule Take 20 mg by mouth in the morning and at bedtime.     tadalafil (CIALIS) 20 MG tablet Take 1 tablet by mouth daily.     ondansetron (ZOFRAN) 8 MG tablet One pill every 8 hours as needed for nausea/vomitting. (Patient not taking: Reported on 01/13/2021) 40 tablet 1   prochlorperazine (COMPAZINE) 10 MG tablet Take 1 tablet (10 mg total) by mouth every 6 (six) hours as needed for nausea or vomiting. 40 tablet 1   No current facility-administered medications for this visit.      Marland Kitchen  PHYSICAL  EXAMINATION: ECOG PERFORMANCE STATUS: 1 - Symptomatic but completely ambulatory  Vitals:   01/13/21 0844  BP: 130/65  Pulse: 63  Resp: 20  Temp: (!) 97.3 F (36.3 C)   Filed Weights   01/13/21 0844  Weight: 236 lb (107 kg)    Physical Exam Constitutional:      Comments: Ambulating independently.  Accompanied by his wife.   HENT:     Head: Normocephalic and atraumatic.     Mouth/Throat:     Pharynx: No oropharyngeal exudate.  Eyes:     Pupils: Pupils are equal, round, and reactive to light.  Cardiovascular:     Rate and Rhythm: Normal rate and regular rhythm.  Pulmonary:     Effort: Pulmonary effort is normal. No respiratory distress.     Breath sounds: Normal breath sounds. No wheezing.  Abdominal:  General: Bowel sounds are normal. There is no distension.     Palpations: Abdomen is soft. There is no mass.     Tenderness: There is no abdominal tenderness. There is no guarding or rebound.  Musculoskeletal:        General: No tenderness. Normal range of motion.     Cervical back: Normal range of motion and neck supple.  Skin:    General: Skin is warm.  Neurological:     Mental Status: He is alert and oriented to person, place, and time.  Psychiatric:        Mood and Affect: Affect normal.      LABORATORY DATA:  I have reviewed the data as listed Lab Results  Component Value Date   WBC 4.5 01/13/2021   HGB 13.2 01/13/2021   HCT 38.0 (L) 01/13/2021   MCV 95.5 01/13/2021   PLT 158 01/13/2021   Recent Labs    08/27/20 1127 09/06/20 0806 09/20/20 0846 09/27/20 0821 11/29/20 0900 12/23/20 0904 01/13/21 0823  NA 134* 139 144   < > 139 138 136  K 4.4 4.7 3.5   < > 3.7 3.8 3.9  CL 99 106 109   < > 104 105 103  CO2 27 22 25    < > 24 22 24   GLUCOSE 115* 210* 164*   < > 109* 177* 144*  BUN 36* 31* 24*   < > 27* 20 34*  CREATININE 1.30* 1.37* 1.29*   < > 1.14 1.14 1.57*  CALCIUM 8.1* 8.5* 8.4*   < > 9.2 8.8* 8.9  GFRNONAA 53* 49* 53*   < > >60 >60 45*   GFRAA >60 57* >60  --   --   --   --   PROT  --  6.5 6.4*   < > 7.0 6.7 7.1  ALBUMIN  --  3.2* 3.2*   < > 3.7 3.5 3.7  AST  --  39 72*   < > 22 26 29   ALT  --  117* 106*   < > 21 25 35  ALKPHOS  --  67 68   < > 78 70 79  BILITOT  --  0.8 0.8   < > 0.9 0.6 0.5   < > = values in this interval not displayed.    RADIOGRAPHIC STUDIES: I have personally reviewed the radiological images as listed and agreed with the findings in the report. CT Chest W Contrast  Result Date: 12/19/2020 CLINICAL DATA:  80 year old male with history of non-small cell lung cancer diagnosed in July 2021. Chemotherapy completed. Currently taking Keytruda. Evaluate for treatment response. EXAM: CT CHEST WITH CONTRAST TECHNIQUE: Multidetector CT imaging of the chest was performed during intravenous contrast administration. CONTRAST:  53mL OMNIPAQUE IOHEXOL 300 MG/ML  SOLN COMPARISON:  Chest CT 09/23/2020. FINDINGS: Cardiovascular: Heart size is normal. There is no significant pericardial fluid, thickening or pericardial calcification. There is aortic atherosclerosis, as well as atherosclerosis of the great vessels of the mediastinum and the coronary arteries, including calcified atherosclerotic plaque in the left main, left anterior descending, left circumflex and right coronary arteries. Right internal jugular single-lumen porta cath with tip terminating at the superior cavoatrial junction. Mediastinum/Nodes: No pathologically enlarged mediastinal or hilar lymph nodes. Esophagus is unremarkable in appearance. No axillary lymphadenopathy. Lungs/Pleura: Previously treated right lower lobe mass continues to show signs of regression, estimated to measure approximately 4.1 x 2.9 x 4.8 cm on today's study (axial image 77 of series 3 and coronal  image 116 of series 5). Surrounding architectural distortion and volume loss predominantly in the right lower lobe, compatible with evolving postradiation mass-like fibrosis. Calcified granuloma  in the anterior aspect of the left upper lobe. No other definite suspicious appearing pulmonary nodules or masses are noted. No acute consolidative airspace disease. No pleural effusions. Upper Abdomen: Multiple low-attenuation lesions in both kidneys compatible with simple cysts, largest of which is exophytic in the upper pole the left kidney measuring 3.1 cm in diameter. In the anterior aspect of the upper pole the left kidney (axial image 164 of series 3) there is also a 2.1 x 1.7 cm intermediate attenuation (50 HU) lesion which is indeterminate. Aortic atherosclerosis. Musculoskeletal: There are no aggressive appearing lytic or blastic lesions noted in the visualized portions of the skeleton. IMPRESSION: 1. Continued positive response to therapy with slight regression in size of central lesion in the superior segment of the right lower lobe, with continued evolution of adjacent postradiation mass-like fibrosis. 2. Aortic atherosclerosis, in addition to left main and 3 vessel coronary artery disease. Assessment for potential risk factor modification, dietary therapy or pharmacologic therapy may be warranted, if clinically indicated. 3. Indeterminate lesion in the anterior aspect of the upper pole the left kidney. This may simply represent a proteinaceous/hemorrhagic cyst. However, further evaluation with nonemergent abdominal MRI with and without IV gadolinium is recommended in the near future to better characterize this finding. Aortic Atherosclerosis (ICD10-I70.0). Electronically Signed   By: Vinnie Langton M.D.   On: 12/19/2020 19:48    ASSESSMENT & PLAN:   Cancer of lower lobe of right lung ([place]) # Lung cancer-non-small cell favor adeno ca; Stage IV; DEC 30th, 2021-  Continued positive response to therapy; inued evolution of adjacent postradiation mass-like fibrosis. currently on [facility].   # proceed with Keytruda maintenance Labs today reviewed;  acceptable for treatment  today.Marland Kitchen  #Anemia thrombocytopenia hemoglobin 13.2  ; platelets 150s- Normal asymptomatic- Improved  # Indeterminate lesion in the anterior aspect of the upper pole the left kidney- will order MRI abodmen.  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  # DISPOSITION: # Keytruda today # follow up in 3 weeks-MD; labs- cbc/cmp  Keytruda-; MRI abdomen prior-Dr.B    All questions were answered. The patient knows to call the clinic with any problems, questions or concerns.    Cammie Sickle, MD 01/14/2021 2:38 PM

## 2021-01-13 NOTE — Assessment & Plan Note (Addendum)
#   Lung cancer-non-small cell favor adeno ca; Stage IV; DEC 30th, 2021-  Continued positive response to therapy; inued evolution of adjacent postradiation mass-like fibrosis. currently on [facility].   # proceed with Keytruda maintenance Labs today reviewed;  acceptable for treatment today.Marland Kitchen  #Anemia thrombocytopenia hemoglobin 13.2  ; platelets 150s- Normal asymptomatic- Improved  # Indeterminate lesion in the anterior aspect of the upper pole the left kidney- will order MRI abodmen.  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  # DISPOSITION: # Keytruda today # follow up in 3 weeks-MD; labs- cbc/cmp  Keytruda-; MRI abdomen prior-Dr.B

## 2021-01-14 ENCOUNTER — Encounter: Payer: Self-pay | Admitting: Internal Medicine

## 2021-01-22 ENCOUNTER — Other Ambulatory Visit: Payer: Self-pay

## 2021-01-22 ENCOUNTER — Ambulatory Visit
Admission: RE | Admit: 2021-01-22 | Discharge: 2021-01-22 | Disposition: A | Discharge status: Home / Self Care | Payer: Medicare PPO | Source: Ambulatory Visit | Attending: Internal Medicine | Admitting: Internal Medicine

## 2021-01-22 ENCOUNTER — Other Ambulatory Visit: Payer: Self-pay | Admitting: Internal Medicine

## 2021-01-22 DIAGNOSIS — N281 Cyst of kidney, acquired: Secondary | ICD-10-CM | POA: Diagnosis present

## 2021-01-22 DIAGNOSIS — C3431 Malignant neoplasm of lower lobe, right bronchus or lung: Secondary | ICD-10-CM | POA: Diagnosis not present

## 2021-01-22 MED ORDER — GADOBUTROL 1 MMOL/ML IV SOLN
10.0000 mL | Freq: Once | INTRAVENOUS | Status: AC | PRN
  Administered 2021-01-22: 10 mL via INTRAVENOUS

## 2021-01-23 ENCOUNTER — Telehealth: Payer: Self-pay | Admitting: Internal Medicine

## 2021-01-23 NOTE — Telephone Encounter (Signed)
On 2/2-I called patient and left a detailed message for the patient regarding the recent MRI findings of 1.7 cm left kidney lesion.  We will also discuss the tumor conference next week; no new recommendations follow-up as planned. GB

## 2021-01-30 ENCOUNTER — Inpatient Hospital Stay: Payer: Medicare PPO | Attending: Internal Medicine

## 2021-01-30 DIAGNOSIS — I129 Hypertensive chronic kidney disease with stage 1 through stage 4 chronic kidney disease, or unspecified chronic kidney disease: Secondary | ICD-10-CM | POA: Insufficient documentation

## 2021-01-30 DIAGNOSIS — C7972 Secondary malignant neoplasm of left adrenal gland: Secondary | ICD-10-CM | POA: Insufficient documentation

## 2021-01-30 DIAGNOSIS — C3431 Malignant neoplasm of lower lobe, right bronchus or lung: Secondary | ICD-10-CM | POA: Insufficient documentation

## 2021-01-30 DIAGNOSIS — Z87891 Personal history of nicotine dependence: Secondary | ICD-10-CM | POA: Insufficient documentation

## 2021-01-30 DIAGNOSIS — K219 Gastro-esophageal reflux disease without esophagitis: Secondary | ICD-10-CM | POA: Insufficient documentation

## 2021-01-30 DIAGNOSIS — Z5112 Encounter for antineoplastic immunotherapy: Secondary | ICD-10-CM | POA: Insufficient documentation

## 2021-01-30 DIAGNOSIS — Z79899 Other long term (current) drug therapy: Secondary | ICD-10-CM | POA: Insufficient documentation

## 2021-01-30 DIAGNOSIS — E1122 Type 2 diabetes mellitus with diabetic chronic kidney disease: Secondary | ICD-10-CM | POA: Insufficient documentation

## 2021-01-30 DIAGNOSIS — E785 Hyperlipidemia, unspecified: Secondary | ICD-10-CM | POA: Insufficient documentation

## 2021-01-30 DIAGNOSIS — N189 Chronic kidney disease, unspecified: Secondary | ICD-10-CM | POA: Insufficient documentation

## 2021-01-30 DIAGNOSIS — Z7982 Long term (current) use of aspirin: Secondary | ICD-10-CM | POA: Insufficient documentation

## 2021-01-30 DIAGNOSIS — C7971 Secondary malignant neoplasm of right adrenal gland: Secondary | ICD-10-CM | POA: Insufficient documentation

## 2021-01-30 NOTE — Progress Notes (Signed)
Tumor Board Documentation  Ralph Williams was presented by Dr Rogue Bussing at our Tumor Board on 01/30/2021, which included representatives from medical oncology,radiation oncology,internal medicine,navigation,pathology,radiology,surgical,pharmacy,genetics,research,palliative care,pulmonology.  Ralph Williams currently presents as a current patient,for discussion with history of the following treatments: active survellience,neoadjuvant chemotherapy.  Additionally, we reviewed previous medical and familial history, history of present illness, and recent lab results along with all available histopathologic and imaging studies. The tumor board considered available treatment options and made the following recommendations:   Refer to Urology, possible surveilence vs ablation  The following procedures/referrals were also placed: No orders of the defined types were placed in this encounter.   Clinical Trial Status: not discussed   Staging used: AJCC Stage Group  AJCC Staging:       Group: Stage IV Lung caner now with renal lesion, possible RCCC   National site-specific guidelines NCCN were discussed with respect to the case.  Tumor board is a meeting of clinicians from various specialty areas who evaluate and discuss patients for whom a multidisciplinary approach is being considered. Final determinations in the plan of care are those of the provider(s). The responsibility for follow up of recommendations given during tumor board is that of the provider.   Today's extended care, comprehensive team conference, Ralph Williams was not present for the discussion and was not examined.   Multidisciplinary Tumor Board is a multidisciplinary case peer review process.  Decisions discussed in the Multidisciplinary Tumor Board reflect the opinions of the specialists present at the conference without having examined the patient.  Ultimately, treatment and diagnostic decisions rest with the primary provider(s) and the  patient.

## 2021-02-03 ENCOUNTER — Inpatient Hospital Stay: Payer: Medicare PPO

## 2021-02-03 ENCOUNTER — Encounter: Payer: Self-pay | Admitting: Internal Medicine

## 2021-02-03 ENCOUNTER — Other Ambulatory Visit: Payer: Self-pay

## 2021-02-03 ENCOUNTER — Inpatient Hospital Stay (HOSPITAL_BASED_OUTPATIENT_CLINIC_OR_DEPARTMENT_OTHER): Payer: Medicare PPO | Admitting: Internal Medicine

## 2021-02-03 VITALS — BP 113/63 | HR 55 | Temp 97.3°F | Resp 18 | Ht 67.5 in | Wt 237.0 lb

## 2021-02-03 DIAGNOSIS — N189 Chronic kidney disease, unspecified: Secondary | ICD-10-CM | POA: Diagnosis not present

## 2021-02-03 DIAGNOSIS — C3431 Malignant neoplasm of lower lobe, right bronchus or lung: Secondary | ICD-10-CM

## 2021-02-03 DIAGNOSIS — E785 Hyperlipidemia, unspecified: Secondary | ICD-10-CM | POA: Diagnosis not present

## 2021-02-03 DIAGNOSIS — I129 Hypertensive chronic kidney disease with stage 1 through stage 4 chronic kidney disease, or unspecified chronic kidney disease: Secondary | ICD-10-CM | POA: Diagnosis not present

## 2021-02-03 DIAGNOSIS — C7971 Secondary malignant neoplasm of right adrenal gland: Secondary | ICD-10-CM | POA: Diagnosis not present

## 2021-02-03 DIAGNOSIS — K219 Gastro-esophageal reflux disease without esophagitis: Secondary | ICD-10-CM | POA: Diagnosis not present

## 2021-02-03 DIAGNOSIS — Z87891 Personal history of nicotine dependence: Secondary | ICD-10-CM | POA: Diagnosis not present

## 2021-02-03 DIAGNOSIS — E1122 Type 2 diabetes mellitus with diabetic chronic kidney disease: Secondary | ICD-10-CM | POA: Diagnosis not present

## 2021-02-03 DIAGNOSIS — Z79899 Other long term (current) drug therapy: Secondary | ICD-10-CM | POA: Diagnosis not present

## 2021-02-03 DIAGNOSIS — Z5112 Encounter for antineoplastic immunotherapy: Secondary | ICD-10-CM | POA: Diagnosis present

## 2021-02-03 DIAGNOSIS — C7972 Secondary malignant neoplasm of left adrenal gland: Secondary | ICD-10-CM | POA: Diagnosis not present

## 2021-02-03 DIAGNOSIS — Z95828 Presence of other vascular implants and grafts: Secondary | ICD-10-CM

## 2021-02-03 DIAGNOSIS — Z7982 Long term (current) use of aspirin: Secondary | ICD-10-CM | POA: Diagnosis not present

## 2021-02-03 LAB — CBC WITH DIFFERENTIAL/PLATELET
Abs Immature Granulocytes: 0.01 10*3/uL (ref 0.00–0.07)
Basophils Absolute: 0 10*3/uL (ref 0.0–0.1)
Basophils Relative: 0 %
Eosinophils Absolute: 0.1 10*3/uL (ref 0.0–0.5)
Eosinophils Relative: 1 %
HCT: 37.3 % — ABNORMAL LOW (ref 39.0–52.0)
Hemoglobin: 12.9 g/dL — ABNORMAL LOW (ref 13.0–17.0)
Immature Granulocytes: 0 %
Lymphocytes Relative: 37 %
Lymphs Abs: 1.7 10*3/uL (ref 0.7–4.0)
MCH: 32.1 pg (ref 26.0–34.0)
MCHC: 34.6 g/dL (ref 30.0–36.0)
MCV: 92.8 fL (ref 80.0–100.0)
Monocytes Absolute: 0.5 10*3/uL (ref 0.1–1.0)
Monocytes Relative: 10 %
Neutro Abs: 2.4 10*3/uL (ref 1.7–7.7)
Neutrophils Relative %: 52 %
Platelets: 160 10*3/uL (ref 150–400)
RBC: 4.02 MIL/uL — ABNORMAL LOW (ref 4.22–5.81)
RDW: 11.7 % (ref 11.5–15.5)
WBC: 4.6 10*3/uL (ref 4.0–10.5)
nRBC: 0 % (ref 0.0–0.2)

## 2021-02-03 LAB — COMPREHENSIVE METABOLIC PANEL
ALT: 21 U/L (ref 0–44)
AST: 23 U/L (ref 15–41)
Albumin: 3.6 g/dL (ref 3.5–5.0)
Alkaline Phosphatase: 65 U/L (ref 38–126)
Anion gap: 10 (ref 5–15)
BUN: 27 mg/dL — ABNORMAL HIGH (ref 8–23)
CO2: 25 mmol/L (ref 22–32)
Calcium: 8.9 mg/dL (ref 8.9–10.3)
Chloride: 103 mmol/L (ref 98–111)
Creatinine, Ser: 1.27 mg/dL — ABNORMAL HIGH (ref 0.61–1.24)
GFR, Estimated: 58 mL/min — ABNORMAL LOW (ref >60)
Glucose, Bld: 133 mg/dL — ABNORMAL HIGH (ref 70–99)
Potassium: 4.1 mmol/L (ref 3.5–5.1)
Sodium: 138 mmol/L (ref 135–145)
Total Bilirubin: 0.7 mg/dL (ref 0.3–1.2)
Total Protein: 7 g/dL (ref 6.5–8.1)

## 2021-02-03 MED ORDER — LIDOCAINE-PRILOCAINE 2.5-2.5 % EX CREA: 1.0000 "application " | TOPICAL_CREAM | CUTANEOUS | 4 refills | Status: AC | PRN

## 2021-02-03 MED ORDER — HEPARIN SOD (PORK) LOCK FLUSH 100 UNIT/ML IV SOLN
INTRAVENOUS | Status: AC
  Filled 2021-02-03: qty 5

## 2021-02-03 MED ORDER — SODIUM CHLORIDE 0.9% FLUSH
10.0000 mL | INTRAVENOUS | Status: DC | PRN
  Filled 2021-02-03: qty 10

## 2021-02-03 MED ORDER — HEPARIN SOD (PORK) LOCK FLUSH 100 UNIT/ML IV SOLN
500.0000 [IU] | Freq: Once | INTRAVENOUS | Status: DC | PRN
  Filled 2021-02-03: qty 5

## 2021-02-03 MED ORDER — SODIUM CHLORIDE 0.9 % IV SOLN
Freq: Once | INTRAVENOUS | Status: AC
  Filled 2021-02-03: qty 250

## 2021-02-03 MED ORDER — SODIUM CHLORIDE 0.9 % IV SOLN
200.0000 mg | Freq: Once | INTRAVENOUS | Status: AC
  Administered 2021-02-03: 200 mg via INTRAVENOUS
  Filled 2021-02-03: qty 8

## 2021-02-03 MED ORDER — HEPARIN SOD (PORK) LOCK FLUSH 100 UNIT/ML IV SOLN
500.0000 [IU] | Freq: Once | INTRAVENOUS | Status: AC
  Administered 2021-02-03: 500 [IU] via INTRAVENOUS
  Filled 2021-02-03: qty 5

## 2021-02-03 MED ORDER — SODIUM CHLORIDE 0.9% FLUSH
10.0000 mL | INTRAVENOUS | Status: DC | PRN
  Administered 2021-02-03: 10 mL via INTRAVENOUS
  Filled 2021-02-03: qty 10

## 2021-02-03 NOTE — Progress Notes (Signed)
Pt tolerated keytruda infusion well today.  Refused compazine administration.  Pt left infusion suite stable and ambulatory.

## 2021-02-03 NOTE — Assessment & Plan Note (Addendum)
#   Lung cancer-non-small cell favor adeno ca; Stage IV; DEC 30th, 2021-  Continued positive response to therapy; inued evolution of adjacent postradiation mass-like fibrosis. currently on [facility].  STABLE>   # proceed with Keytruda maintenance Labs today reviewed;  acceptable for treatment today.  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  Discussed options including surgery/ablation/close surveillance.  Patient for now chooses close surveillance.  Which is very reasonable.  Also reviewed at the tumor conference.  # CKD- III- GFR- 58 STABLE.  continue PO hydration.  # DISPOSITION: # Keytruda today # follow up in 3 weeks-MD; labs- cbc/cmp/ TSH  Keytruda--Dr.B

## 2021-02-03 NOTE — Progress Notes (Signed)
[place] NOTE  Patient Care Team: Ezequiel Kayser, MD as PCP - General (Internal Medicine) Telford Nab, RN as Oncology Nurse Navigator  CHIEF COMPLAINTS/PURPOSE OF CONSULTATION: lung cancer  #  Oncology History Overview Note  # July 2021- RLL- 9.4 cm x 7.5 cm x 6.7 cm lobulated low-attenuation heterogeneous lung mass within the posteromedial aspect of the right lower lobe, at the level of the right hilum; positive for hilar; mediastinal adenopathy; PET scan-July 2021 + for adrenal metastases; CT-guided biopsy-non-small cell favor adenocarcinoma; necrosis  # carbo-alimtac  [name]; NGS pending] cycle #2 carbo Alimta [place]; 10/18/2020-maintenance Keytruda  # 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 CKD  # NGS/MOLECULAR TESTS: TPS =70%; rest **  # PALLIATIVE CARE EVALUATION:     DIAGNOSIS: Lung cancer  STAGE: 4       ;  GOALS: Palliative  CURRENT/MOST RECENT THERAPY : Carbo Alimta Keytruda    Cancer of lower lobe of right lung ([place])  07/05/2020 Initial Diagnosis   Cancer of lower lobe of right lung ([place])   07/26/2020 -  Chemotherapy    Patient is on Treatment Plan: LUNG CARBOPLATIN / PEMETREXED / PEMBROLIZUMAB Q21D INDUCTION X 4 CYCLES / MAINTENANCE PEMETREXED + PEMBROLIZUMAB         HISTORY OF PRESENTING ILLNESS:  Ralph Williams 80 y.o.  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  Patient denies any new shortness of breath or cough with no swelling the left breast no diarrhea.  No skin rash.  Review of Systems  Constitutional: Negative for chills, diaphoresis, fever, malaise/fatigue and weight loss.  HENT: Negative for nosebleeds and sore throat.   Eyes: Negative for double vision.  Respiratory: Negative for hemoptysis, sputum production and shortness  of breath.   Cardiovascular: Negative for chest pain, palpitations, orthopnea and leg swelling.  Gastrointestinal: Negative for abdominal pain, blood in stool, constipation, diarrhea, heartburn, melena, nausea and vomiting.  Genitourinary: Negative for dysuria, frequency and urgency.  Musculoskeletal: Negative for back pain and joint pain.  Skin: Negative.  Negative for itching and rash.  Neurological: Negative for dizziness, tingling, focal weakness, weakness and headaches.  Endo/Heme/Allergies: Does not bruise/bleed easily.  Psychiatric/Behavioral: Negative for depression. The patient is not nervous/anxious.      MEDICAL HISTORY:  Past Medical History:  Diagnosis Date  . Chronic renal insufficiency   . Diabetes mellitus without complication ([place])    Pt states it's diet controlled.  Marland Kitchen GERD (gastroesophageal reflux disease)   . Hyperlipidemia associated with type 2 diabetes mellitus ([place])   . Hypertension   . Lung mass   . Non-small cell lung cancer, right ([place]) 06/2020    SURGICAL HISTORY: Past Surgical History:  Procedure Laterality Date  . IR IMAGING GUIDED PORT INSERTION  07/22/2020  . lymphoma removal     back    SOCIAL HISTORY: Social History   Socioeconomic History  . Marital status: Married    Spouse name: Not on file  . Number of children: Not on file  . Years of education: Not on file  . Highest education level: Not on file  Occupational History  . Not on file  Tobacco Use  . Smoking status: Former Smoker    Quit date: 07/23/2007    Years since quitting: 13.5  . Smokeless tobacco: Never Used  Vaping  Use  . Vaping Use: Never used  Substance and Sexual Activity  . Alcohol use: Not Currently    Comment: none for 5 months  . Drug use: Never  . Sexual activity: Not on file  Other Topics Concern  . Not on file  Social History Narrative   Moved from [place]; lives in [place] in 2021; daughter lives next door. Quit smoking- 13 years ago. Social alcohol.  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    Social Determinants of Health   Financial Resource Strain: Not on file  Food Insecurity: Not on file  Transportation Needs: Not on file  Physical Activity: Not on file  Stress: Not on file  Social Connections: Not on file  Intimate Partner Violence: Not on file    FAMILY HISTORY: Family History  Problem Relation Age of Onset  . Liver cancer Sister   . Cancer - Other Brother        bone cancer [2001]    ALLERGIES:  is allergic to niacin and related.  MEDICATIONS:  Current Outpatient Medications  Medication Sig Dispense Refill  . acetaminophen (TYLENOL) 500 MG tablet Take 2 tablets by mouth every 8 (eight) hours as needed.    Marland Kitchen aspirin 325 MG EC tablet Take 1 tablet by mouth daily.    Marland Kitchen atenolol (TENORMIN) 25 MG tablet Take 1 tablet by mouth daily.    Marland Kitchen atorvastatin (LIPITOR) 40 MG tablet Take 1 tablet by mouth daily.    . hydrochlorothiazide (HYDRODIURIL) 12.5 MG tablet Take 12.5 mg by mouth daily.    Marland Kitchen lidocaine-prilocaine (EMLA) cream Apply 1 application topically as needed. 30 g 4  . losartan (COZAAR) 50 MG tablet Take 1 tablet by mouth daily.    . Melatonin 10 MG TABS Take 1 tablet by mouth at bedtime.    . Omega-3 1000 MG CAPS Take 1,000 mg by mouth in the morning and at bedtime.    Marland Kitchen omeprazole (PRILOSEC) 20 MG capsule Take 20 mg by mouth in the morning and at bedtime.    . ondansetron (ZOFRAN) 8 MG tablet One pill every 8 hours as needed for nausea/vomitting. 40 tablet 1  . prochlorperazine (COMPAZINE) 10 MG tablet Take 1 tablet (10 mg total) by mouth every 6 (six) hours as needed for nausea or vomiting. 40 tablet 1  . tadalafil (CIALIS) 20 MG tablet Take 1 tablet by mouth daily.     No current facility-administered medications for this visit.   Facility-Administered Medications Ordered in Other Visits  Medication Dose Route Frequency Provider Last Rate Last Admin  . heparin lock flush 100 unit/mL  500  Units Intracatheter Once PRN Charlaine Dalton R, MD      . sodium chloride flush (NS) 0.9 % injection 10 mL  10 mL Intravenous PRN Cammie Sickle, MD   10 mL at 02/03/21 0905  . sodium chloride flush (NS) 0.9 % injection 10 mL  10 mL Intracatheter PRN Cammie Sickle, MD          .  PHYSICAL EXAMINATION: ECOG PERFORMANCE STATUS: 1 - Symptomatic but completely ambulatory  Vitals:   02/03/21 0915  BP: 113/63  Pulse: (!) 55  Resp: 18  Temp: (!) 97.3 F (36.3 C)   Filed Weights   02/03/21 0915  Weight: 237 lb (107.5 kg)    Physical Exam Constitutional:      Comments: Ambulating independently.  Accompanied by his wife.   HENT:     Head: Normocephalic and atraumatic.  Mouth/Throat:     Pharynx: No oropharyngeal exudate.  Eyes:     Pupils: Pupils are equal, round, and reactive to light.  Cardiovascular:     Rate and Rhythm: Normal rate and regular rhythm.  Pulmonary:     Effort: Pulmonary effort is normal. No respiratory distress.     Breath sounds: Normal breath sounds. No wheezing.  Abdominal:     General: Bowel sounds are normal. There is no distension.     Palpations: Abdomen is soft. There is no mass.     Tenderness: There is no abdominal tenderness. There is no guarding or rebound.  Musculoskeletal:        General: No tenderness. Normal range of motion.     Cervical back: Normal range of motion and neck supple.  Skin:    General: Skin is warm.  Neurological:     Mental Status: He is alert and oriented to person, place, and time.  Psychiatric:        Mood and Affect: Affect normal.      LABORATORY DATA:  I have reviewed the data as listed Lab Results  Component Value Date   WBC 4.6 02/03/2021   HGB 12.9 (L) 02/03/2021   HCT 37.3 (L) 02/03/2021   MCV 92.8 02/03/2021   PLT 160 02/03/2021   Recent Labs    08/27/20 1127 09/06/20 0806 09/20/20 0846 09/27/20 0821 12/23/20 0904 01/13/21 0823 02/03/21 0845  NA 134* 139 144   < > 138  136 138  K 4.4 4.7 3.5   < > 3.8 3.9 4.1  CL 99 106 109   < > 105 103 103  CO2 27 22 25    < > 22 24 25   GLUCOSE 115* 210* 164*   < > 177* 144* 133*  BUN 36* 31* 24*   < > 20 34* 27*  CREATININE 1.30* 1.37* 1.29*   < > 1.14 1.57* 1.27*  CALCIUM 8.1* 8.5* 8.4*   < > 8.8* 8.9 8.9  GFRNONAA 53* 49* 53*   < > >60 45* 58*  GFRAA >60 57* >60  --   --   --   --   PROT  --  6.5 6.4*   < > 6.7 7.1 7.0  ALBUMIN  --  3.2* 3.2*   < > 3.5 3.7 3.6  AST  --  39 72*   < > 26 29 23   ALT  --  117* 106*   < > 25 35 21  ALKPHOS  --  67 68   < > 70 79 65  BILITOT  --  0.8 0.8   < > 0.6 0.5 0.7   < > = values in this interval not displayed.    RADIOGRAPHIC STUDIES: I have personally reviewed the radiological images as listed and agreed with the findings in the report. MR ABDOMEN W WO CONTRAST  Result Date: 01/22/2021 CLINICAL DATA:  Left renal indeterminate lesion for further workup. EXAM: MRI ABDOMEN WITHOUT AND WITH CONTRAST TECHNIQUE: Multiplanar multisequence MR imaging of the abdomen was performed both before and after the administration of intravenous contrast. CONTRAST:  33mL GADAVIST GADOBUTROL 1 MMOL/ML IV SOLN COMPARISON:  PET-CT 07/11/2020 and overlapping portions of CT chest from 12/19/2020 FINDINGS: Lower chest: Stable scarring in the right lower lobe. Hepatobiliary: Unremarkable Pancreas:  Unremarkable Spleen:  Unremarkable Adrenals/Urinary Tract:  Both adrenal glands appear normal. Scattered Bosniak category 1 and category 2 cysts are present in the kidneys. 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 A left kidney lower pole lesion measuring 1.1 cm in diameter on image 30 of series 6 has low T2 and high T1 signal characteristics., but there is poor signal to noise ratio in this  region making it difficult to confidently exclude enhancement. Two small lateral lesions with T2 hyperintensity in the left mid kidney laterally are present measuring 0.5 cm in diameter on image 25 of series 6, these lesions are likewise indeterminate due to small size. Stomach/Bowel: Unremarkable Vascular/Lymphatic: No pathologic adenopathy or evidence of tumor thrombus in the left renal vein. Other:  No supplemental non-categorized findings. Musculoskeletal: Lower lumbar spondylosis and degenerative disc disease. IMPRESSION: 1. 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 No tumor thrombus in the renal vein or adenopathy. 2. Against a backdrop of bilateral scattered Bosniak category 1 and category 2 cysts, three additional small lesions in the left kidney are essentially indeterminate, with high T1 and low T2 signal characteristics but indeterminate enhancement due to small size and motion artifact. Odds are that these additional lesions are probably benign but they at least require surveillance. 3. A bilobed right kidney upper pole lesion does not have measurable enhancement on direct measurements, and accordingly is considered Bosniak category 2. 4. Other imaging findings of potential clinical significance: Stable scarring in the right lower lobe. Lower lumbar spondylosis and degenerative disc disease. Electronically Signed   By: Van Clines M.D.   On: 01/22/2021 13:24    ASSESSMENT & PLAN:   Cancer of lower lobe of right lung ([place]) # Lung cancer-non-small cell favor adeno ca; Stage IV; DEC 30th, 2021-  Continued positive response to therapy; inued evolution of adjacent postradiation mass-like fibrosis. currently on [facility].  STABLE>   # proceed with Keytruda maintenance Labs today reviewed;  acceptable for treatment today.  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  Discussed options including surgery/ablation/close surveillance.  Patient for now chooses close surveillance.  Which is very reasonable.  Also reviewed at the tumor conference.  # CKD- III- GFR- 58 STABLE.  continue PO hydration.  # DISPOSITION: # Keytruda today # follow up in 3 weeks-MD; labs- cbc/cmp/ TSH  Keytruda--Dr.B    All questions were answered. The patient knows to call the clinic with any problems, questions or concerns.    Cammie Sickle, MD 02/03/2021 1:27 PM

## 2021-02-24 ENCOUNTER — Other Ambulatory Visit: Payer: Self-pay

## 2021-02-24 ENCOUNTER — Inpatient Hospital Stay: Payer: Medicare PPO

## 2021-02-24 ENCOUNTER — Inpatient Hospital Stay: Payer: Medicare PPO | Attending: Internal Medicine

## 2021-02-24 ENCOUNTER — Inpatient Hospital Stay (HOSPITAL_BASED_OUTPATIENT_CLINIC_OR_DEPARTMENT_OTHER): Payer: Medicare PPO | Admitting: Internal Medicine

## 2021-02-24 DIAGNOSIS — E1122 Type 2 diabetes mellitus with diabetic chronic kidney disease: Secondary | ICD-10-CM | POA: Diagnosis not present

## 2021-02-24 DIAGNOSIS — I129 Hypertensive chronic kidney disease with stage 1 through stage 4 chronic kidney disease, or unspecified chronic kidney disease: Secondary | ICD-10-CM | POA: Diagnosis not present

## 2021-02-24 DIAGNOSIS — Z79899 Other long term (current) drug therapy: Secondary | ICD-10-CM | POA: Diagnosis not present

## 2021-02-24 DIAGNOSIS — N183 Chronic kidney disease, stage 3 unspecified: Secondary | ICD-10-CM | POA: Diagnosis not present

## 2021-02-24 DIAGNOSIS — Z87891 Personal history of nicotine dependence: Secondary | ICD-10-CM | POA: Insufficient documentation

## 2021-02-24 DIAGNOSIS — C3431 Malignant neoplasm of lower lobe, right bronchus or lung: Secondary | ICD-10-CM | POA: Diagnosis present

## 2021-02-24 DIAGNOSIS — E785 Hyperlipidemia, unspecified: Secondary | ICD-10-CM | POA: Diagnosis not present

## 2021-02-24 DIAGNOSIS — Z5112 Encounter for antineoplastic immunotherapy: Secondary | ICD-10-CM | POA: Diagnosis present

## 2021-02-24 DIAGNOSIS — C7971 Secondary malignant neoplasm of right adrenal gland: Secondary | ICD-10-CM | POA: Insufficient documentation

## 2021-02-24 DIAGNOSIS — C771 Secondary and unspecified malignant neoplasm of intrathoracic lymph nodes: Secondary | ICD-10-CM | POA: Diagnosis not present

## 2021-02-24 DIAGNOSIS — C7972 Secondary malignant neoplasm of left adrenal gland: Secondary | ICD-10-CM | POA: Diagnosis not present

## 2021-02-24 DIAGNOSIS — K219 Gastro-esophageal reflux disease without esophagitis: Secondary | ICD-10-CM | POA: Insufficient documentation

## 2021-02-24 LAB — CBC WITH DIFFERENTIAL/PLATELET
Abs Immature Granulocytes: 0 10*3/uL (ref 0.00–0.07)
Basophils Absolute: 0 10*3/uL (ref 0.0–0.1)
Basophils Relative: 0 %
Eosinophils Absolute: 0.1 10*3/uL (ref 0.0–0.5)
Eosinophils Relative: 2 %
HCT: 37.9 % — ABNORMAL LOW (ref 39.0–52.0)
Hemoglobin: 13 g/dL (ref 13.0–17.0)
Immature Granulocytes: 0 %
Lymphocytes Relative: 48 %
Lymphs Abs: 1.9 10*3/uL (ref 0.7–4.0)
MCH: 31.6 pg (ref 26.0–34.0)
MCHC: 34.3 g/dL (ref 30.0–36.0)
MCV: 92.2 fL (ref 80.0–100.0)
Monocytes Absolute: 0.4 10*3/uL (ref 0.1–1.0)
Monocytes Relative: 10 %
Neutro Abs: 1.6 10*3/uL — ABNORMAL LOW (ref 1.7–7.7)
Neutrophils Relative %: 40 %
Platelets: 156 10*3/uL (ref 150–400)
RBC: 4.11 MIL/uL — ABNORMAL LOW (ref 4.22–5.81)
RDW: 12.2 % (ref 11.5–15.5)
WBC: 4 10*3/uL (ref 4.0–10.5)
nRBC: 0 % (ref 0.0–0.2)

## 2021-02-24 LAB — COMPREHENSIVE METABOLIC PANEL
ALT: 28 U/L (ref 0–44)
AST: 28 U/L (ref 15–41)
Albumin: 3.8 g/dL (ref 3.5–5.0)
Alkaline Phosphatase: 67 U/L (ref 38–126)
Anion gap: 9 (ref 5–15)
BUN: 31 mg/dL — ABNORMAL HIGH (ref 8–23)
CO2: 24 mmol/L (ref 22–32)
Calcium: 9 mg/dL (ref 8.9–10.3)
Chloride: 104 mmol/L (ref 98–111)
Creatinine, Ser: 1.14 mg/dL (ref 0.61–1.24)
GFR, Estimated: >60 mL/min (ref >60)
Glucose, Bld: 111 mg/dL — ABNORMAL HIGH (ref 70–99)
Potassium: 3.9 mmol/L (ref 3.5–5.1)
Sodium: 137 mmol/L (ref 135–145)
Total Bilirubin: 1 mg/dL (ref 0.3–1.2)
Total Protein: 7.3 g/dL (ref 6.5–8.1)

## 2021-02-24 LAB — TSH: TSH: 0.987 u[IU]/mL (ref 0.350–4.500)

## 2021-02-24 MED ORDER — HEPARIN SOD (PORK) LOCK FLUSH 100 UNIT/ML IV SOLN
INTRAVENOUS | Status: AC
  Filled 2021-02-24: qty 5

## 2021-02-24 MED ORDER — SODIUM CHLORIDE 0.9 % IV SOLN
200.0000 mg | Freq: Once | INTRAVENOUS | Status: AC
  Administered 2021-02-24: 200 mg via INTRAVENOUS
  Filled 2021-02-24: qty 8

## 2021-02-24 MED ORDER — SODIUM CHLORIDE 0.9 % IV SOLN
Freq: Once | INTRAVENOUS | Status: AC
  Filled 2021-02-24: qty 250

## 2021-02-24 MED ORDER — PROCHLORPERAZINE MALEATE 10 MG PO TABS
10.0000 mg | ORAL_TABLET | Freq: Once | ORAL | Status: DC
  Filled 2021-02-24: qty 1

## 2021-02-24 MED ORDER — HEPARIN SOD (PORK) LOCK FLUSH 100 UNIT/ML IV SOLN
500.0000 [IU] | Freq: Once | INTRAVENOUS | Status: AC | PRN
  Administered 2021-02-24: 500 [IU]
  Filled 2021-02-24: qty 5

## 2021-02-24 NOTE — Progress Notes (Signed)
[place] NOTE  Patient Care Team: Ezequiel Kayser, MD as PCP - General (Internal Medicine) Telford Nab, RN as Oncology Nurse Navigator  CHIEF COMPLAINTS/PURPOSE OF CONSULTATION: lung cancer  #  Oncology History Overview Note  # July 2021- RLL- 9.4 cm x 7.5 cm x 6.7 cm lobulated low-attenuation heterogeneous lung mass within the posteromedial aspect of the right lower lobe, at the level of the right hilum; positive for hilar; mediastinal adenopathy; PET scan-July 2021 + for adrenal metastases; CT-guided biopsy-non-small cell favor adenocarcinoma; necrosis  # carbo-alimtac  [name]; NGS pending] cycle #2 carbo Alimta [place]; 10/18/2020-maintenance Keytruda  # 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 CKD  # NGS/MOLECULAR TESTS: TPS =70%; rest **  # PALLIATIVE CARE EVALUATION:     DIAGNOSIS: Lung cancer  STAGE: 4       ;  GOALS: Palliative  CURRENT/MOST RECENT THERAPY : Carbo Alimta Keytruda    Cancer of lower lobe of right lung ([place])  07/05/2020 Initial Diagnosis   Cancer of lower lobe of right lung ([place])   07/26/2020 -  Chemotherapy    Patient is on Treatment Plan: LUNG CARBOPLATIN / PEMETREXED / PEMBROLIZUMAB Q21D INDUCTION X 4 CYCLES / MAINTENANCE PEMETREXED + PEMBROLIZUMAB         HISTORY OF PRESENTING ILLNESS:  Ralph Williams 80 y.o.  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  Patient denies any diarrhea.  No abdominal pain.  No swelling in the legs.  No skin rash.  No new shortness of breath or cough.  Review of Systems  Constitutional: Negative for chills, diaphoresis, fever, malaise/fatigue and weight loss.  HENT: Negative for nosebleeds and sore throat.   Eyes: Negative for double vision.  Respiratory: Negative for hemoptysis, sputum production and shortness of breath.    Cardiovascular: Negative for chest pain, palpitations, orthopnea and leg swelling.  Gastrointestinal: Negative for abdominal pain, blood in stool, constipation, diarrhea, heartburn, melena, nausea and vomiting.  Genitourinary: Negative for dysuria, frequency and urgency.  Musculoskeletal: Negative for back pain and joint pain.  Skin: Negative.  Negative for itching and rash.  Neurological: Negative for dizziness, tingling, focal weakness, weakness and headaches.  Endo/Heme/Allergies: Does not bruise/bleed easily.  Psychiatric/Behavioral: Negative for depression. The patient is not nervous/anxious.      MEDICAL HISTORY:  Past Medical History:  Diagnosis Date  . Chronic renal insufficiency   . Diabetes mellitus without complication ([place])    Pt states it's diet controlled.  Marland Kitchen GERD (gastroesophageal reflux disease)   . Hyperlipidemia associated with type 2 diabetes mellitus ([place])   . Hypertension   . Lung mass   . Non-small cell lung cancer, right ([place]) 06/2020    SURGICAL HISTORY: Past Surgical History:  Procedure Laterality Date  . IR IMAGING GUIDED PORT INSERTION  07/22/2020  . lymphoma removal     back    SOCIAL HISTORY: Social History   Socioeconomic History  . Marital status: Married    Spouse name: Not on file  . Number of children: Not on file  . Years of education: Not on file  . Highest education level: Not on file  Occupational History  . Not on file  Tobacco Use  . Smoking status: Former Smoker    Quit date: 07/23/2007    Years since quitting: 13.6  . Smokeless tobacco: Never Used  Vaping  Use  . Vaping Use: Never used  Substance and Sexual Activity  . Alcohol use: Not Currently    Comment: none for 5 months  . Drug use: Never  . Sexual activity: Not on file  Other Topics Concern  . Not on file  Social History Narrative   Moved from [place]; lives in [place] in 2021; daughter lives next door. Quit smoking- 13 years ago. Social alcohol. Retd.  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    Social Determinants of Health   Financial Resource Strain: Not on file  Food Insecurity: Not on file  Transportation Needs: Not on file  Physical Activity: Not on file  Stress: Not on file  Social Connections: Not on file  Intimate Partner Violence: Not on file    FAMILY HISTORY: Family History  Problem Relation Age of Onset  . Liver cancer Sister   . Cancer - Other Brother        bone cancer [2001]    ALLERGIES:  is allergic to niacin and related.  MEDICATIONS:  Current Outpatient Medications  Medication Sig Dispense Refill  . acetaminophen (TYLENOL) 500 MG tablet Take 2 tablets by mouth every 8 (eight) hours as needed.    Marland Kitchen aspirin 325 MG EC tablet Take 1 tablet by mouth daily.    Marland Kitchen atenolol (TENORMIN) 25 MG tablet Take 1 tablet by mouth daily.    Marland Kitchen atorvastatin (LIPITOR) 40 MG tablet Take 1 tablet by mouth daily.    . hydrochlorothiazide (HYDRODIURIL) 12.5 MG tablet Take 12.5 mg by mouth daily.    Marland Kitchen lidocaine-prilocaine (EMLA) cream Apply 1 application topically as needed. 30 g 4  . losartan (COZAAR) 50 MG tablet Take 1 tablet by mouth daily.    . Melatonin 10 MG TABS Take 1 tablet by mouth at bedtime.    . Omega-3 1000 MG CAPS Take 1,000 mg by mouth in the morning and at bedtime.    Marland Kitchen omeprazole (PRILOSEC) 20 MG capsule Take 20 mg by mouth in the morning and at bedtime.    . ondansetron (ZOFRAN) 8 MG tablet One pill every 8 hours as needed for nausea/vomitting. 40 tablet 1  . prochlorperazine (COMPAZINE) 10 MG tablet Take 1 tablet (10 mg total) by mouth every 6 (six) hours as needed for nausea or vomiting. 40 tablet 1  . tadalafil (CIALIS) 20 MG tablet Take 1 tablet by mouth daily.     No current facility-administered medications for this visit.      Marland Kitchen  PHYSICAL EXAMINATION: ECOG PERFORMANCE STATUS: 1 - Symptomatic but completely ambulatory  Vitals:   02/24/21 0846  BP: 123/65  Pulse: (!) 58  Resp: 16   Temp: (!) 96 F (35.6 C)  SpO2: 97%   Filed Weights   02/24/21 0846  Weight: 234 lb 9.6 oz (106.4 kg)    Physical Exam Constitutional:      Comments: Ambulating independently.  Accompanied by his wife.   HENT:     Head: Normocephalic and atraumatic.     Mouth/Throat:     Pharynx: No oropharyngeal exudate.  Eyes:     Pupils: Pupils are equal, round, and reactive to light.  Cardiovascular:     Rate and Rhythm: Normal rate and regular rhythm.  Pulmonary:     Effort: Pulmonary effort is normal. No respiratory distress.     Breath sounds: Normal breath sounds. No wheezing.  Abdominal:     General: Bowel sounds are normal. There is no distension.     Palpations:  Abdomen is soft. There is no mass.     Tenderness: There is no abdominal tenderness. There is no guarding or rebound.  Musculoskeletal:        General: No tenderness. Normal range of motion.     Cervical back: Normal range of motion and neck supple.  Skin:    General: Skin is warm.  Neurological:     Mental Status: He is alert and oriented to person, place, and time.  Psychiatric:        Mood and Affect: Affect normal.      LABORATORY DATA:  I have reviewed the data as listed Lab Results  Component Value Date   WBC 4.0 02/24/2021   HGB 13.0 02/24/2021   HCT 37.9 (L) 02/24/2021   MCV 92.2 02/24/2021   PLT 156 02/24/2021   Recent Labs    08/27/20 1127 09/06/20 0806 09/20/20 0846 09/27/20 0821 01/13/21 0823 02/03/21 0845 02/24/21 0832  NA 134* 139 144   < > 136 138 137  K 4.4 4.7 3.5   < > 3.9 4.1 3.9  CL 99 106 109   < > 103 103 104  CO2 27 22 25    < > 24 25 24   GLUCOSE 115* 210* 164*   < > 144* 133* 111*  BUN 36* 31* 24*   < > 34* 27* 31*  CREATININE 1.30* 1.37* 1.29*   < > 1.57* 1.27* 1.14  CALCIUM 8.1* 8.5* 8.4*   < > 8.9 8.9 9.0  GFRNONAA 53* 49* 53*   < > 45* 58* >60  GFRAA >60 57* >60  --   --   --   --   PROT  --  6.5 6.4*   < > 7.1 7.0 7.3  ALBUMIN  --  3.2* 3.2*   < > 3.7 3.6 3.8  AST   --  39 72*   < > 29 23 28   ALT  --  117* 106*   < > 35 21 28  ALKPHOS  --  67 68   < > 79 65 67  BILITOT  --  0.8 0.8   < > 0.5 0.7 1.0   < > = values in this interval not displayed.    RADIOGRAPHIC STUDIES: I have personally reviewed the radiological images as listed and agreed with the findings in the report. No results found.  ASSESSMENT & PLAN:   Cancer of lower lobe of right lung ([place]) # Lung cancer-non-small cell favor adeno ca; Stage IV; DEC 30th, 2021-  Continued positive response to therapy; inued evolution of adjacent postradiation mass-like fibrosis. currently on [facility].  STABLE.   # proceed with Keytruda maintenance Labs today reviewed;  acceptable for treatment today.  We will plan to get imaging of prior to next visit.  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  Patient for now chooses close surveillance.    # CKD- III- GFR- 60- STABLE-.  continue PO hydration.  Vac-May # DISPOSITION: # Keytruda today # follow up in 3 weeks-MD; labs- cbc/cmp/ TSH  Keytruda;CT chest--Dr.B    All questions were answered. The patient knows to call the clinic with any problems, questions or concerns.    Cammie Sickle, MD 02/24/2021 9:12 AM

## 2021-02-24 NOTE — Assessment & Plan Note (Addendum)
#   Lung cancer-non-small cell favor adeno ca; Stage IV; DEC 30th, 2021-  Continued positive response to therapy; inued evolution of adjacent postradiation mass-like fibrosis. currently on [facility].  STABLE.   # proceed with Keytruda maintenance Labs today reviewed;  acceptable for treatment today.  We will plan to get imaging of prior to next visit.  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  Patient for now chooses close surveillance.    # CKD- III- GFR- 60- STABLE-.  continue PO hydration.  Vac-May # DISPOSITION: # Keytruda today # follow up in 3 weeks-MD; labs- cbc/cmp/ TSH  Keytruda;CT chest--Dr.B

## 2021-03-11 ENCOUNTER — Ambulatory Visit
Admission: RE | Admit: 2021-03-11 | Discharge: 2021-03-11 | Disposition: A | Discharge status: Home / Self Care | Payer: Medicare PPO | Source: Ambulatory Visit | Attending: Internal Medicine | Admitting: Internal Medicine

## 2021-03-11 ENCOUNTER — Other Ambulatory Visit: Payer: Self-pay

## 2021-03-11 DIAGNOSIS — C3431 Malignant neoplasm of lower lobe, right bronchus or lung: Secondary | ICD-10-CM | POA: Diagnosis present

## 2021-03-11 MED ORDER — IOHEXOL 300 MG/ML  SOLN
75.0000 mL | Freq: Once | INTRAMUSCULAR | Status: AC | PRN
  Administered 2021-03-11: 75 mL via INTRAVENOUS

## 2021-03-17 ENCOUNTER — Inpatient Hospital Stay: Payer: Medicare PPO

## 2021-03-17 ENCOUNTER — Encounter: Payer: Self-pay | Admitting: Internal Medicine

## 2021-03-17 ENCOUNTER — Other Ambulatory Visit: Payer: Self-pay

## 2021-03-17 ENCOUNTER — Inpatient Hospital Stay (HOSPITAL_BASED_OUTPATIENT_CLINIC_OR_DEPARTMENT_OTHER): Payer: Medicare PPO | Admitting: Internal Medicine

## 2021-03-17 DIAGNOSIS — Z5112 Encounter for antineoplastic immunotherapy: Secondary | ICD-10-CM | POA: Diagnosis not present

## 2021-03-17 DIAGNOSIS — C3431 Malignant neoplasm of lower lobe, right bronchus or lung: Secondary | ICD-10-CM

## 2021-03-17 DIAGNOSIS — Z95828 Presence of other vascular implants and grafts: Secondary | ICD-10-CM

## 2021-03-17 LAB — CBC WITH DIFFERENTIAL/PLATELET
Abs Immature Granulocytes: 0.01 10*3/uL (ref 0.00–0.07)
Basophils Absolute: 0 10*3/uL (ref 0.0–0.1)
Basophils Relative: 0 %
Eosinophils Absolute: 0.1 10*3/uL (ref 0.0–0.5)
Eosinophils Relative: 1 %
HCT: 38.2 % — ABNORMAL LOW (ref 39.0–52.0)
Hemoglobin: 13.1 g/dL (ref 13.0–17.0)
Immature Granulocytes: 0 %
Lymphocytes Relative: 35 %
Lymphs Abs: 2 10*3/uL (ref 0.7–4.0)
MCH: 31.5 pg (ref 26.0–34.0)
MCHC: 34.3 g/dL (ref 30.0–36.0)
MCV: 91.8 fL (ref 80.0–100.0)
Monocytes Absolute: 0.4 10*3/uL (ref 0.1–1.0)
Monocytes Relative: 7 %
Neutro Abs: 3.2 10*3/uL (ref 1.7–7.7)
Neutrophils Relative %: 57 %
Platelets: 157 10*3/uL (ref 150–400)
RBC: 4.16 MIL/uL — ABNORMAL LOW (ref 4.22–5.81)
RDW: 13 % (ref 11.5–15.5)
WBC: 5.6 10*3/uL (ref 4.0–10.5)
nRBC: 0 % (ref 0.0–0.2)

## 2021-03-17 LAB — COMPREHENSIVE METABOLIC PANEL
ALT: 25 U/L (ref 0–44)
AST: 28 U/L (ref 15–41)
Albumin: 3.9 g/dL (ref 3.5–5.0)
Alkaline Phosphatase: 65 U/L (ref 38–126)
Anion gap: 9 (ref 5–15)
BUN: 26 mg/dL — ABNORMAL HIGH (ref 8–23)
CO2: 24 mmol/L (ref 22–32)
Calcium: 8.9 mg/dL (ref 8.9–10.3)
Chloride: 105 mmol/L (ref 98–111)
Creatinine, Ser: 1.27 mg/dL — ABNORMAL HIGH (ref 0.61–1.24)
GFR, Estimated: 58 mL/min — ABNORMAL LOW (ref >60)
Glucose, Bld: 178 mg/dL — ABNORMAL HIGH (ref 70–99)
Potassium: 3.6 mmol/L (ref 3.5–5.1)
Sodium: 138 mmol/L (ref 135–145)
Total Bilirubin: 0.7 mg/dL (ref 0.3–1.2)
Total Protein: 6.8 g/dL (ref 6.5–8.1)

## 2021-03-17 LAB — TSH: TSH: 2.29 u[IU]/mL (ref 0.350–4.500)

## 2021-03-17 MED ORDER — SODIUM CHLORIDE 0.9 % IV SOLN
Freq: Once | INTRAVENOUS | Status: AC
  Filled 2021-03-17: qty 250

## 2021-03-17 MED ORDER — HEPARIN SOD (PORK) LOCK FLUSH 100 UNIT/ML IV SOLN
500.0000 [IU] | Freq: Once | INTRAVENOUS | Status: AC
  Administered 2021-03-17: 500 [IU] via INTRAVENOUS
  Filled 2021-03-17: qty 5

## 2021-03-17 MED ORDER — HEPARIN SOD (PORK) LOCK FLUSH 100 UNIT/ML IV SOLN
INTRAVENOUS | Status: AC
  Filled 2021-03-17: qty 5

## 2021-03-17 MED ORDER — SODIUM CHLORIDE 0.9 % IV SOLN
200.0000 mg | Freq: Once | INTRAVENOUS | Status: AC
  Administered 2021-03-17: 200 mg via INTRAVENOUS
  Filled 2021-03-17: qty 8

## 2021-03-17 MED ORDER — PROCHLORPERAZINE MALEATE 10 MG PO TABS
10.0000 mg | ORAL_TABLET | Freq: Once | ORAL | Status: DC
  Filled 2021-03-17: qty 1

## 2021-03-17 MED ORDER — SODIUM CHLORIDE 0.9% FLUSH
10.0000 mL | Freq: Once | INTRAVENOUS | Status: AC
  Administered 2021-03-17: 10 mL via INTRAVENOUS
  Filled 2021-03-17: qty 10

## 2021-03-17 NOTE — Progress Notes (Signed)
[facility] NOTE  Patient Care Team: Ezequiel Kayser, MD as PCP - General (Internal Medicine) Telford Nab, RN as Oncology Nurse Navigator  CHIEF COMPLAINTS/PURPOSE OF CONSULTATION: lung cancer  #  Oncology History Overview Note  # July 2021- RLL- 9.4 cm x 7.5 cm x 6.7 cm lobulated low-attenuation heterogeneous lung mass within the posteromedial aspect of the right lower lobe, at the level of the right hilum; positive for hilar; mediastinal adenopathy; PET scan-July 2021 + for adrenal metastases; CT-guided biopsy-non-small cell favor adenocarcinoma; necrosis  # carbo-alimtac  [name]; NGS pending] cycle #2 carbo Alimta [place]; 10/18/2020-maintenance Keytruda  # 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 CKD  # NGS/MOLECULAR TESTS: TPS =70%; rest **  # PALLIATIVE CARE EVALUATION:     DIAGNOSIS: Lung cancer  STAGE: 4       ;  GOALS: Palliative  CURRENT/MOST RECENT THERAPY : Carbo Alimta Keytruda    Cancer of lower lobe of right lung ([place])  07/05/2020 Initial Diagnosis   Cancer of lower lobe of right lung ([place])   07/26/2020 -  Chemotherapy    Patient is on Treatment Plan: LUNG CARBOPLATIN / PEMETREXED / PEMBROLIZUMAB Q21D INDUCTION X 4 CYCLES / MAINTENANCE PEMETREXED + PEMBROLIZUMAB         HISTORY OF PRESENTING ILLNESS:  Ralph Williams 80 y.o.  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  Patient denies any headaches.  Denies any nausea vomiting.  No abdominal pain.  No swelling the legs.  No skin rash.  No new shortness of breath or cough.  Is planning a vacation in May.   Review of Systems  Constitutional: Negative for chills, diaphoresis, fever, malaise/fatigue and weight loss.  HENT: Negative for nosebleeds and sore throat.   Eyes: Negative for double vision.   Respiratory: Negative for hemoptysis, sputum production and shortness of breath.   Cardiovascular: Negative for chest pain, palpitations, orthopnea and leg swelling.  Gastrointestinal: Negative for abdominal pain, blood in stool, constipation, diarrhea, heartburn, melena, nausea and vomiting.  Genitourinary: Negative for dysuria, frequency and urgency.  Musculoskeletal: Negative for back pain and joint pain.  Skin: Negative.  Negative for itching and rash.  Neurological: Negative for dizziness, tingling, focal weakness, weakness and headaches.  Endo/Heme/Allergies: Does not bruise/bleed easily.  Psychiatric/Behavioral: Negative for depression. The patient is not nervous/anxious.      MEDICAL HISTORY:  Past Medical History:  Diagnosis Date  . Chronic renal insufficiency   . Diabetes mellitus without complication ([place])    Pt states it's diet controlled.  Marland Kitchen GERD (gastroesophageal reflux disease)   . Hyperlipidemia associated with type 2 diabetes mellitus ([place])   . Hypertension   . Lung mass   . Non-small cell lung cancer, right ([place]) 06/2020    SURGICAL HISTORY: Past Surgical History:  Procedure Laterality Date  . IR IMAGING GUIDED PORT INSERTION  07/22/2020  . lymphoma removal     back    SOCIAL HISTORY: Social History   Socioeconomic History  . Marital status: Married    Spouse name: Not on file  . Number of children: Not on file  . Years of education: Not on file  . Highest education level: Not on file  Occupational History  . Not on file  Tobacco Use  . 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 Smokeless tobacco: Never Used  Vaping Use  . Vaping Use: Never used  Substance and Sexual Activity  . Alcohol use: Not Currently    Comment: none for 5 months  . Drug use: Never  . Sexual activity: Not on file  Other Topics Concern  . Not on file  Social History Narrative   Moved from [place]; lives in [place] in 2021;  daughter lives next door. Quit smoking- 13 years ago. Social alcohol. 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    Social Determinants of Health   Financial Resource Strain: Not on file  Food Insecurity: Not on file  Transportation Needs: Not on file  Physical Activity: Not on file  Stress: Not on file  Social Connections: Not on file  Intimate Partner Violence: Not on file    FAMILY HISTORY: Family History  Problem Relation Age of Onset  . Liver cancer Sister   . Cancer - Other Brother        bone cancer [2001]    ALLERGIES:  is allergic to niacin and related.  MEDICATIONS:  Current Outpatient Medications  Medication Sig Dispense Refill  . acetaminophen (TYLENOL) 500 MG tablet Take 2 tablets by mouth every 8 (eight) hours as needed.    Marland Kitchen aspirin 325 MG EC tablet Take 1 tablet by mouth daily.    Marland Kitchen atenolol (TENORMIN) 25 MG tablet Take 1 tablet by mouth daily.    Marland Kitchen atorvastatin (LIPITOR) 40 MG tablet Take 1 tablet by mouth daily.    . hydrochlorothiazide (HYDRODIURIL) 12.5 MG tablet Take 12.5 mg by mouth daily.    Marland Kitchen lidocaine-prilocaine (EMLA) cream Apply 1 application topically as needed. 30 g 4  . losartan (COZAAR) 50 MG tablet Take 1 tablet by mouth daily.    . Melatonin 10 MG TABS Take 1 tablet by mouth at bedtime.    . Omega-3 1000 MG CAPS Take 1,000 mg by mouth in the morning and at bedtime.    Marland Kitchen omeprazole (PRILOSEC) 20 MG capsule Take 20 mg by mouth in the morning and at bedtime.    . ondansetron (ZOFRAN) 8 MG tablet One pill every 8 hours as needed for nausea/vomitting. 40 tablet 1  . prochlorperazine (COMPAZINE) 10 MG tablet Take 1 tablet (10 mg total) by mouth every 6 (six) hours as needed for nausea or vomiting. 40 tablet 1  . tadalafil (CIALIS) 20 MG tablet Take 1 tablet by mouth daily.     No current facility-administered medications for this visit.   Facility-Administered Medications Ordered in Other Visits  Medication Dose Route Frequency  Provider Last Rate Last Admin  . prochlorperazine (COMPAZINE) tablet 10 mg  10 mg Oral Once Cammie Sickle, MD          .  PHYSICAL EXAMINATION: ECOG PERFORMANCE STATUS: 1 - Symptomatic but completely ambulatory  Vitals:   03/17/21 0903  BP: 115/64  Pulse: 63  Resp: 16  Temp: (!) 95.6 F (35.3 C)  SpO2: 100%   Filed Weights   03/17/21 0903  Weight: 234 lb (106.1 kg)    Physical Exam Constitutional:      Comments: Ambulating independently.  Accompanied by his wife.   HENT:     Head: Normocephalic and atraumatic.     Mouth/Throat:     Pharynx: No oropharyngeal exudate.  Eyes:     Pupils: Pupils are equal, round, and reactive to light.  Cardiovascular:     Rate  and Rhythm: Normal rate and regular rhythm.  Pulmonary:     Effort: Pulmonary effort is normal. No respiratory distress.     Breath sounds: Normal breath sounds. No wheezing.  Abdominal:     General: Bowel sounds are normal. There is no distension.     Palpations: Abdomen is soft. There is no mass.     Tenderness: There is no abdominal tenderness. There is no guarding or rebound.  Musculoskeletal:        General: No tenderness. Normal range of motion.     Cervical back: Normal range of motion and neck supple.  Skin:    General: Skin is warm.  Neurological:     Mental Status: He is alert and oriented to person, place, and time.  Psychiatric:        Mood and Affect: Affect normal.      LABORATORY DATA:  I have reviewed the data as listed Lab Results  Component Value Date   WBC 5.6 03/17/2021   HGB 13.1 03/17/2021   HCT 38.2 (L) 03/17/2021   MCV 91.8 03/17/2021   PLT 157 03/17/2021   Recent Labs    08/27/20 1127 09/06/20 0806 09/20/20 0846 09/27/20 0821 02/03/21 0845 02/24/21 0832 03/17/21 0846  NA 134* 139 144   < > 138 137 138  K 4.4 4.7 3.5   < > 4.1 3.9 3.6  CL 99 106 109   < > 103 104 105  CO2 27 22 25    < > 25 24 24   GLUCOSE 115* 210* 164*   < > 133* 111* 178*  BUN 36* 31*  24*   < > 27* 31* 26*  CREATININE 1.30* 1.37* 1.29*   < > 1.27* 1.14 1.27*  CALCIUM 8.1* 8.5* 8.4*   < > 8.9 9.0 8.9  GFRNONAA 53* 49* 53*   < > 58* >60 58*  GFRAA >60 57* >60  --   --   --   --   PROT  --  6.5 6.4*   < > 7.0 7.3 6.8  ALBUMIN  --  3.2* 3.2*   < > 3.6 3.8 3.9  AST  --  39 72*   < > 23 28 28   ALT  --  117* 106*   < > 21 28 25   ALKPHOS  --  67 68   < > 65 67 65  BILITOT  --  0.8 0.8   < > 0.7 1.0 0.7   < > = values in this interval not displayed.    RADIOGRAPHIC STUDIES: I have personally reviewed the radiological images as listed and agreed with the findings in the report. CT Chest W Contrast  Result Date: 03/11/2021 CLINICAL DATA:  Stage IV right lower lobe non-small cell lung cancer diagnosed July 2021 status post chemotherapy and radiation therapy with ongoing immunotherapy. Restaging. EXAM: CT CHEST WITH CONTRAST TECHNIQUE: Multidetector CT imaging of the chest was performed during intravenous contrast administration. CONTRAST:  28mL OMNIPAQUE IOHEXOL 300 MG/ML  SOLN COMPARISON:  12/19/2020 chest CT. FINDINGS: Cardiovascular: Normal heart size. No significant pericardial effusion/thickening. Left main and 3 vessel coronary atherosclerosis. Right internal jugular Port-A-Cath terminates at the cavoatrial junction. Atherosclerotic nonaneurysmal thoracic aorta. Stable top-normal caliber main pulmonary artery (3.1 cm diameter). No central pulmonary emboli. Mediastinum/Nodes: No discrete thyroid nodules. Unremarkable esophagus. No axillary adenopathy. Stable coarsely calcified nonenlarged paratracheal and AP window nodes. No pathologically enlarged mediastinal nodes. Lungs/Pleura: No pneumothorax. No pleural effusion. No acute consolidative airspace disease. Sharply marginated  masslike fibrosis in posterior mid to lower right perihilar lung with associated bronchiectasis, volume loss and distortion, not appreciably changed, compatible with postradiation change. Stable subcentimeter  calcified anterior left upper lobe granuloma. Tiny 0.2 cm solid posterior left upper lobe pulmonary nodule (series 3/image 33) is stable. No new significant pulmonary nodules. Upper abdomen: Hypodense anterior upper left renal cortical exophytic 2.0 cm lesion (series 2/image 160), stable. Additional simple upper renal cysts, largest 2.8 cm on the left. Musculoskeletal: No aggressive appearing focal osseous lesions. Mild thoracic spondylosis. IMPRESSION: 1. Stable postradiation change in the posterior right perihilar lung. No evidence of local tumor recurrence. 2. No evidence of metastatic disease in the chest. 3. 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 Urology consultation suggested. 4. Left main and 3 vessel coronary atherosclerosis. 5. Aortic Atherosclerosis (ICD10-I70.0). Electronically Signed   By: Ilona Sorrel M.D.   On: 03/11/2021 15:55    ASSESSMENT & PLAN:   Cancer of lower lobe of right lung ([place]) # Lung cancer-non-small cell favor adeno ca; Stage IV; MARCH 24th, 2022- Stable postradiation change in the posterior right perihilar lung.  No evidence of any tumor recurrence noted.  # proceed with Keytruda maintenance Labs today reviewed;  acceptable for treatment today.    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  Vac-May # DISPOSITION: # Keytruda today # follow up in 3 weeks-MD; labs- cbc/cmp/ Keytruda;Dr.B  # I reviewed the blood work- with the patient in detail; also reviewed the imaging independently [as summarized above]; and with the patient in detail.      All questions were answered. The patient knows to call the clinic with any problems, questions or concerns.    Cammie Sickle, MD 03/17/2021 4:26 PM

## 2021-03-17 NOTE — Assessment & Plan Note (Addendum)
#   Lung cancer-non-small cell favor adeno ca; Stage IV; MARCH 24th, 2022- Stable postradiation change in the posterior right perihilar lung.  No evidence of any tumor recurrence noted.  # proceed with Keytruda maintenance Labs today reviewed;  acceptable for treatment today.    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  Vac-May # DISPOSITION: # Keytruda today # follow up in 3 weeks-MD; labs- cbc/cmp/ Keytruda;Dr.B  # I reviewed the blood work- with the patient in detail; also reviewed the imaging independently [as summarized above]; and with the patient in detail.

## 2021-03-21 DIAGNOSIS — Z5112 Encounter for antineoplastic immunotherapy: Secondary | ICD-10-CM | POA: Insufficient documentation

## 2021-03-21 DIAGNOSIS — C3431 Malignant neoplasm of lower lobe, right bronchus or lung: Secondary | ICD-10-CM | POA: Insufficient documentation

## 2021-03-24 ENCOUNTER — Telehealth: Payer: Self-pay | Admitting: *Deleted

## 2021-03-24 NOTE — Telephone Encounter (Signed)
Patient called and requests Dr B call him about his CT results, he has some questions. His next appointment is 04/07/21.  IMPRESSION: 1. Stable postradiation change in the posterior right perihilar lung. No evidence of local tumor recurrence. 2. No evidence of metastatic disease in the chest. 3. 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 Urology consultation suggested. 4. Left main and 3 vessel coronary atherosclerosis. 5. Aortic Atherosclerosis (ICD10-I70.0).   Electronically Signed   By: Ilona Sorrel M.D.   On: 03/11/2021 15:55

## 2021-03-25 ENCOUNTER — Telehealth: Payer: Self-pay | Admitting: Internal Medicine

## 2021-03-25 ENCOUNTER — Encounter: Payer: Self-pay | Admitting: *Deleted

## 2021-03-25 NOTE — Telephone Encounter (Signed)
Mychart msg sent to patient- md will call patient by the end of the day.

## 2021-03-25 NOTE — Telephone Encounter (Signed)
Dr. B - please advise. 

## 2021-03-25 NOTE — Telephone Encounter (Signed)
H/T- please inform pt that I will call him later today GB

## 2021-03-25 NOTE — Telephone Encounter (Signed)
On 4/05- spoke to pt re: results of the CT scan- stable disease; and concurred with pt that he did not get radiation. Follow up as planned-GB

## 2021-03-28 ENCOUNTER — Inpatient Hospital Stay: Payer: Medicare PPO | Attending: Hospice and Palliative Medicine | Admitting: Hospice and Palliative Medicine

## 2021-03-28 DIAGNOSIS — Z515 Encounter for palliative care: Secondary | ICD-10-CM

## 2021-03-28 NOTE — Progress Notes (Signed)
Patient's phone number went straight to voicemail.  Voicemail left.  Will reschedule visit.

## 2021-04-07 ENCOUNTER — Inpatient Hospital Stay: Payer: Medicare PPO | Attending: Internal Medicine

## 2021-04-07 ENCOUNTER — Encounter: Payer: Self-pay | Admitting: Internal Medicine

## 2021-04-07 ENCOUNTER — Other Ambulatory Visit: Payer: Self-pay

## 2021-04-07 ENCOUNTER — Inpatient Hospital Stay: Payer: Medicare PPO | Attending: Internal Medicine | Admitting: Internal Medicine

## 2021-04-07 DIAGNOSIS — K219 Gastro-esophageal reflux disease without esophagitis: Secondary | ICD-10-CM | POA: Diagnosis not present

## 2021-04-07 DIAGNOSIS — Z79899 Other long term (current) drug therapy: Secondary | ICD-10-CM | POA: Diagnosis not present

## 2021-04-07 DIAGNOSIS — I2510 Atherosclerotic heart disease of native coronary artery without angina pectoris: Secondary | ICD-10-CM | POA: Insufficient documentation

## 2021-04-07 DIAGNOSIS — C3431 Malignant neoplasm of lower lobe, right bronchus or lung: Secondary | ICD-10-CM

## 2021-04-07 DIAGNOSIS — N183 Chronic kidney disease, stage 3 unspecified: Secondary | ICD-10-CM | POA: Insufficient documentation

## 2021-04-07 DIAGNOSIS — E785 Hyperlipidemia, unspecified: Secondary | ICD-10-CM | POA: Insufficient documentation

## 2021-04-07 DIAGNOSIS — Z5112 Encounter for antineoplastic immunotherapy: Secondary | ICD-10-CM | POA: Diagnosis not present

## 2021-04-07 DIAGNOSIS — Z87891 Personal history of nicotine dependence: Secondary | ICD-10-CM | POA: Diagnosis not present

## 2021-04-07 DIAGNOSIS — I129 Hypertensive chronic kidney disease with stage 1 through stage 4 chronic kidney disease, or unspecified chronic kidney disease: Secondary | ICD-10-CM | POA: Diagnosis not present

## 2021-04-07 DIAGNOSIS — E1122 Type 2 diabetes mellitus with diabetic chronic kidney disease: Secondary | ICD-10-CM | POA: Insufficient documentation

## 2021-04-07 DIAGNOSIS — Z7982 Long term (current) use of aspirin: Secondary | ICD-10-CM | POA: Insufficient documentation

## 2021-04-07 LAB — CBC WITH DIFFERENTIAL/PLATELET
Abs Immature Granulocytes: 0 10*3/uL (ref 0.00–0.07)
Basophils Absolute: 0 10*3/uL (ref 0.0–0.1)
Basophils Relative: 0 %
Eosinophils Absolute: 0.1 10*3/uL (ref 0.0–0.5)
Eosinophils Relative: 2 %
HCT: 37.3 % — ABNORMAL LOW (ref 39.0–52.0)
Hemoglobin: 12.6 g/dL — ABNORMAL LOW (ref 13.0–17.0)
Immature Granulocytes: 0 %
Lymphocytes Relative: 42 %
Lymphs Abs: 2 10*3/uL (ref 0.7–4.0)
MCH: 31.1 pg (ref 26.0–34.0)
MCHC: 33.8 g/dL (ref 30.0–36.0)
MCV: 92.1 fL (ref 80.0–100.0)
Monocytes Absolute: 0.5 10*3/uL (ref 0.1–1.0)
Monocytes Relative: 11 %
Neutro Abs: 2.1 10*3/uL (ref 1.7–7.7)
Neutrophils Relative %: 45 %
Platelets: 154 10*3/uL (ref 150–400)
RBC: 4.05 MIL/uL — ABNORMAL LOW (ref 4.22–5.81)
RDW: 13.8 % (ref 11.5–15.5)
WBC: 4.7 10*3/uL (ref 4.0–10.5)
nRBC: 0 % (ref 0.0–0.2)

## 2021-04-07 LAB — COMPREHENSIVE METABOLIC PANEL
ALT: 23 U/L (ref 0–44)
AST: 24 U/L (ref 15–41)
Albumin: 3.9 g/dL (ref 3.5–5.0)
Alkaline Phosphatase: 64 U/L (ref 38–126)
Anion gap: 9 (ref 5–15)
BUN: 30 mg/dL — ABNORMAL HIGH (ref 8–23)
CO2: 23 mmol/L (ref 22–32)
Calcium: 9.1 mg/dL (ref 8.9–10.3)
Chloride: 106 mmol/L (ref 98–111)
Creatinine, Ser: 1.47 mg/dL — ABNORMAL HIGH (ref 0.61–1.24)
GFR, Estimated: 49 mL/min — ABNORMAL LOW (ref >60)
Glucose, Bld: 111 mg/dL — ABNORMAL HIGH (ref 70–99)
Potassium: 3.9 mmol/L (ref 3.5–5.1)
Sodium: 138 mmol/L (ref 135–145)
Total Bilirubin: 1 mg/dL (ref 0.3–1.2)
Total Protein: 7.2 g/dL (ref 6.5–8.1)

## 2021-04-07 MED ORDER — HEPARIN SOD (PORK) LOCK FLUSH 100 UNIT/ML IV SOLN
500.0000 [IU] | Freq: Once | INTRAVENOUS | Status: AC
  Administered 2021-04-07: 500 [IU] via INTRAVENOUS
  Filled 2021-04-07: qty 5

## 2021-04-07 MED ORDER — SODIUM CHLORIDE 0.9 % IV SOLN
200.0000 mg | Freq: Once | INTRAVENOUS | Status: AC
  Administered 2021-04-07: 200 mg via INTRAVENOUS
  Filled 2021-04-07: qty 8

## 2021-04-07 MED ORDER — PROCHLORPERAZINE MALEATE 10 MG PO TABS
10.0000 mg | ORAL_TABLET | Freq: Once | ORAL | Status: DC
  Filled 2021-04-07: qty 1

## 2021-04-07 MED ORDER — SODIUM CHLORIDE 0.9 % IV SOLN
Freq: Once | INTRAVENOUS | Status: AC
  Filled 2021-04-07: qty 250

## 2021-04-07 MED ORDER — SODIUM CHLORIDE 0.9% FLUSH
10.0000 mL | Freq: Once | INTRAVENOUS | Status: AC
  Administered 2021-04-07: 10 mL via INTRAVENOUS
  Filled 2021-04-07: qty 10

## 2021-04-07 MED ORDER — HEPARIN SOD (PORK) LOCK FLUSH 100 UNIT/ML IV SOLN
INTRAVENOUS | Status: AC
  Filled 2021-04-07: qty 5

## 2021-04-07 NOTE — Assessment & Plan Note (Addendum)
#   Lung cancer-non-small cell favor adeno ca; Stage IV; MARCH 24th, 2022- stable; change in the posterior right perihilar lung.  No evidence of any tumor recurrence noted.  # proceed with Keytruda maintenance Labs today reviewed;  acceptable for treatment today.    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  Vac-May # DISPOSITION: # Keytruda today # follow up in 3 weeks-MD; labs- cbc/cmp/ Keytruda;Dr.B

## 2021-04-07 NOTE — Progress Notes (Signed)
[facility] NOTE  Patient Care Team: Ezequiel Kayser, MD as PCP - General (Internal Medicine) Telford Nab, RN as Oncology Nurse Navigator  CHIEF COMPLAINTS/PURPOSE OF CONSULTATION: lung cancer  #  Oncology History Overview Note  # July 2021- RLL- 9.4 cm x 7.5 cm x 6.7 cm lobulated low-attenuation heterogeneous lung mass within the posteromedial aspect of the right lower lobe, at the level of the right hilum; positive for hilar; mediastinal adenopathy; PET scan-July 2021 + for adrenal metastases; CT-guided biopsy-non-small cell favor adenocarcinoma; necrosis  # carbo-alimtac  [name]; NGS pending] cycle #2 carbo [facility]; 10/18/2020-maintenance Keytruda  # 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 CKD  # NGS/MOLECULAR TESTS: TPS =70%; rest **  # PALLIATIVE CARE EVALUATION:     DIAGNOSIS: Lung cancer  STAGE: 4       ;  GOALS: Palliative  CURRENT/MOST RECENT THERAPY : Carbo [facility] Keytruda    Cancer of lower lobe of right lung ([place])  07/05/2020 Initial Diagnosis   Cancer of lower lobe of right lung ([place])   07/26/2020 -  Chemotherapy    Patient is on Treatment Plan: LUNG CARBOPLATIN / PEMETREXED / PEMBROLIZUMAB Q21D INDUCTION X 4 CYCLES / MAINTENANCE PEMETREXED + PEMBROLIZUMAB         HISTORY OF PRESENTING ILLNESS:  Ralph Williams 80 y.o.  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  Patient denies any nausea vomiting or diarrhea.  Denies any swelling in the legs.  No skin rash.  No new shortness of breath or cough.   Review of Systems  Constitutional: Negative for chills, diaphoresis, fever, malaise/fatigue and weight loss.  HENT: Negative for nosebleeds and sore throat.   Eyes: Negative for double vision.  Respiratory: Negative for hemoptysis, sputum production and shortness of breath.    Cardiovascular: Negative for chest pain, palpitations, orthopnea and leg swelling.  Gastrointestinal: Negative for abdominal pain, blood in stool, constipation, diarrhea, heartburn, melena, nausea and vomiting.  Genitourinary: Negative for dysuria, frequency and urgency.  Musculoskeletal: Negative for back pain and joint pain.  Skin: Negative.  Negative for itching and rash.  Neurological: Negative for dizziness, tingling, focal weakness, weakness and headaches.  Endo/Heme/Allergies: Does not bruise/bleed easily.  Psychiatric/Behavioral: Negative for depression. The patient is not nervous/anxious.      MEDICAL HISTORY:  Past Medical History:  Diagnosis Date  . Chronic renal insufficiency   . Diabetes mellitus without complication ([place])    Pt states it's diet controlled.  Marland Kitchen GERD (gastroesophageal reflux disease)   . Hyperlipidemia associated with type 2 diabetes mellitus ([place])   . Hypertension   . Lung mass   . Non-small cell lung cancer, right ([place]) 06/2020    SURGICAL HISTORY: Past Surgical History:  Procedure Laterality Date  . IR IMAGING GUIDED PORT INSERTION  07/22/2020  . lymphoma removal     back    SOCIAL HISTORY: Social History   Socioeconomic History  . Marital status: Married    Spouse name: Not on file  . Number of children: Not on file  . Years of education: Not on file  . Highest education level: Not on file  Occupational History  . Not on file  Tobacco Use  . Smoking status: Former Smoker    Quit date: 07/23/2007    Years since quitting: 13.7  . Smokeless tobacco: Never Used  Vaping Use  . Vaping Use: Never used  Substance and Sexual Activity  . Alcohol use: Not Currently    Comment: none for 5 months  . Drug use: Never  . Sexual activity: Not on file  Other Topics Concern  . Not on file  Social History Narrative   Moved from [place]; lives in [place] in 2021; daughter lives next door. Quit smoking- 13 years ago. Social alcohol. Retd.  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    Social Determinants of Health   Financial Resource Strain: Not on file  Food Insecurity: Not on file  Transportation Needs: Not on file  Physical Activity: Not on file  Stress: Not on file  Social Connections: Not on file  Intimate Partner Violence: Not on file    FAMILY HISTORY: Family History  Problem Relation Age of Onset  . Liver cancer Sister   . Cancer - Other Brother        bone cancer [2001]    ALLERGIES:  is allergic to niacin and related.  MEDICATIONS:  Current Outpatient Medications  Medication Sig Dispense Refill  . acetaminophen (TYLENOL) 500 MG tablet Take 2 tablets by mouth every 8 (eight) hours as needed.    Marland Kitchen aspirin 325 MG EC tablet Take 1 tablet by mouth daily.    Marland Kitchen atenolol (TENORMIN) 25 MG tablet Take 1 tablet by mouth daily.    Marland Kitchen atorvastatin (LIPITOR) 40 MG tablet Take 1 tablet by mouth daily.    . hydrochlorothiazide (HYDRODIURIL) 12.5 MG tablet Take 12.5 mg by mouth daily.    Marland Kitchen lidocaine-prilocaine (EMLA) cream Apply 1 application topically as needed. 30 g 4  . losartan (COZAAR) 50 MG tablet Take 1 tablet by mouth daily.    . Melatonin 10 MG TABS Take 1 tablet by mouth at bedtime.    . Omega-3 1000 MG CAPS Take 1,000 mg by mouth in the morning and at bedtime.    Marland Kitchen omeprazole (PRILOSEC) 20 MG capsule Take 20 mg by mouth in the morning and at bedtime.    . ondansetron (ZOFRAN) 8 MG tablet One pill every 8 hours as needed for nausea/vomitting. 40 tablet 1  . prochlorperazine (COMPAZINE) 10 MG tablet Take 1 tablet (10 mg total) by mouth every 6 (six) hours as needed for nausea or vomiting. 40 tablet 1  . tadalafil (CIALIS) 20 MG tablet Take 1 tablet by mouth daily.     No current facility-administered medications for this visit.   Facility-Administered Medications Ordered in Other Visits  Medication Dose Route Frequency Provider Last Rate Last Admin  . heparin lock flush 100 unit/mL  500 Units  Intravenous Once Charlaine Dalton R, MD      . prochlorperazine (COMPAZINE) tablet 10 mg  10 mg Oral Once Cammie Sickle, MD          .  PHYSICAL EXAMINATION: ECOG PERFORMANCE STATUS: 1 - Symptomatic but completely ambulatory  Vitals:   04/07/21 0921  BP: 102/68  Pulse: (!) 54  Resp: 16  Temp: (!) 95.9 F (35.5 C)  SpO2: 96%   Filed Weights   04/07/21 0921  Weight: 234 lb (106.1 kg)    Physical Exam Constitutional:      Comments: Ambulating independently.  Accompanied by his wife.   HENT:     Head: Normocephalic and atraumatic.     Mouth/Throat:     Pharynx: No oropharyngeal exudate.  Eyes:     Pupils: Pupils are equal, round, and reactive to light.  Cardiovascular:  Rate and Rhythm: Normal rate and regular rhythm.  Pulmonary:     Effort: Pulmonary effort is normal. No respiratory distress.     Breath sounds: Normal breath sounds. No wheezing.  Abdominal:     General: Bowel sounds are normal. There is no distension.     Palpations: Abdomen is soft. There is no mass.     Tenderness: There is no abdominal tenderness. There is no guarding or rebound.  Musculoskeletal:        General: No tenderness. Normal range of motion.     Cervical back: Normal range of motion and neck supple.  Skin:    General: Skin is warm.  Neurological:     Mental Status: He is alert and oriented to person, place, and time.  Psychiatric:        Mood and Affect: Affect normal.      LABORATORY DATA:  I have reviewed the data as listed Lab Results  Component Value Date   WBC 4.7 04/07/2021   HGB 12.6 (L) 04/07/2021   HCT 37.3 (L) 04/07/2021   MCV 92.1 04/07/2021   PLT 154 04/07/2021   Recent Labs    08/27/20 1127 09/06/20 0806 09/20/20 0846 09/27/20 0821 02/24/21 0832 03/17/21 0846 04/07/21 0910  NA 134* 139 144   < > 137 138 138  K 4.4 4.7 3.5   < > 3.9 3.6 3.9  CL 99 106 109   < > 104 105 106  CO2 27 22 25    < > 24 24 23   GLUCOSE 115* 210* 164*   < > 111*  178* 111*  BUN 36* 31* 24*   < > 31* 26* 30*  CREATININE 1.30* 1.37* 1.29*   < > 1.14 1.27* 1.47*  CALCIUM 8.1* 8.5* 8.4*   < > 9.0 8.9 9.1  GFRNONAA 53* 49* 53*   < > >60 58* 49*  GFRAA >60 57* >60  --   --   --   --   PROT  --  6.5 6.4*   < > 7.3 6.8 7.2  ALBUMIN  --  3.2* 3.2*   < > 3.8 3.9 3.9  AST  --  39 72*   < > 28 28 24   ALT  --  117* 106*   < > 28 25 23   ALKPHOS  --  67 68   < > 67 65 64  BILITOT  --  0.8 0.8   < > 1.0 0.7 1.0   < > = values in this interval not displayed.    RADIOGRAPHIC STUDIES: I have personally reviewed the radiological images as listed and agreed with the findings in the report. CT Chest W Contrast  Result Date: 03/11/2021 CLINICAL DATA:  Stage IV right lower lobe non-small cell lung cancer diagnosed July 2021 status post chemotherapy and radiation therapy with ongoing immunotherapy. Restaging. EXAM: CT CHEST WITH CONTRAST TECHNIQUE: Multidetector CT imaging of the chest was performed during intravenous contrast administration. CONTRAST:  71mL OMNIPAQUE IOHEXOL 300 MG/ML  SOLN COMPARISON:  12/19/2020 chest CT. FINDINGS: Cardiovascular: Normal heart size. No significant pericardial effusion/thickening. Left main and 3 vessel coronary atherosclerosis. Right internal jugular Port-A-Cath terminates at the cavoatrial junction. Atherosclerotic nonaneurysmal thoracic aorta. Stable top-normal caliber main pulmonary artery (3.1 cm diameter). No central pulmonary emboli. Mediastinum/Nodes: No discrete thyroid nodules. Unremarkable esophagus. No axillary adenopathy. Stable coarsely calcified nonenlarged paratracheal and AP window nodes. No pathologically enlarged mediastinal nodes. Lungs/Pleura: No pneumothorax. No pleural effusion. No acute consolidative airspace disease.  Sharply marginated masslike fibrosis in posterior mid to lower right perihilar lung with associated bronchiectasis, volume loss and distortion, not appreciably changed, compatible with postradiation change.  Stable subcentimeter calcified anterior left upper lobe granuloma. Tiny 0.2 cm solid posterior left upper lobe pulmonary nodule (series 3/image 33) is stable. No new significant pulmonary nodules. Upper abdomen: Hypodense anterior upper left renal cortical exophytic 2.0 cm lesion (series 2/image 160), stable. Additional simple upper renal cysts, largest 2.8 cm on the left. Musculoskeletal: No aggressive appearing focal osseous lesions. Mild thoracic spondylosis. IMPRESSION: 1. Stable postradiation change in the posterior right perihilar lung. No evidence of local tumor recurrence. 2. No evidence of metastatic disease in the chest. 3. 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 Urology consultation suggested. 4. Left main and 3 vessel coronary atherosclerosis. 5. Aortic Atherosclerosis (ICD10-I70.0). Electronically Signed   By: Ilona Sorrel M.D.   On: 03/11/2021 15:55    ASSESSMENT & PLAN:   Cancer of lower lobe of right lung ([facility]) # Lung cancer-non-small cell favor adeno ca; Stage IV; MARCH 24th, 2022- stable; change in the posterior right perihilar lung.  No evidence of any tumor recurrence noted.  # proceed with Keytruda maintenance Labs today reviewed;  acceptable for treatment today.    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  Vac-May # DISPOSITION: # Keytruda today # follow up in 3 weeks-MD; labs- cbc/cmp/ Keytruda;Dr.B     All questions were answered. The patient knows to call the clinic with any problems, questions or concerns.    Cammie Sickle, MD 04/07/2021 11:32 AM

## 2021-04-28 ENCOUNTER — Inpatient Hospital Stay (HOSPITAL_BASED_OUTPATIENT_CLINIC_OR_DEPARTMENT_OTHER): Payer: Medicare PPO | Admitting: Internal Medicine

## 2021-04-28 ENCOUNTER — Inpatient Hospital Stay: Payer: Medicare PPO

## 2021-04-28 ENCOUNTER — Inpatient Hospital Stay: Payer: Medicare PPO | Attending: Internal Medicine

## 2021-04-28 ENCOUNTER — Other Ambulatory Visit: Payer: Self-pay

## 2021-04-28 VITALS — BP 113/62 | HR 59 | Temp 95.0°F | Resp 18 | Ht 67.0 in | Wt 234.0 lb

## 2021-04-28 DIAGNOSIS — C3431 Malignant neoplasm of lower lobe, right bronchus or lung: Secondary | ICD-10-CM

## 2021-04-28 DIAGNOSIS — E1165 Type 2 diabetes mellitus with hyperglycemia: Secondary | ICD-10-CM | POA: Diagnosis not present

## 2021-04-28 DIAGNOSIS — Z87891 Personal history of nicotine dependence: Secondary | ICD-10-CM | POA: Insufficient documentation

## 2021-04-28 DIAGNOSIS — E785 Hyperlipidemia, unspecified: Secondary | ICD-10-CM | POA: Insufficient documentation

## 2021-04-28 DIAGNOSIS — I129 Hypertensive chronic kidney disease with stage 1 through stage 4 chronic kidney disease, or unspecified chronic kidney disease: Secondary | ICD-10-CM | POA: Diagnosis not present

## 2021-04-28 DIAGNOSIS — I152 Hypertension secondary to endocrine disorders: Secondary | ICD-10-CM | POA: Insufficient documentation

## 2021-04-28 DIAGNOSIS — N183 Chronic kidney disease, stage 3 unspecified: Secondary | ICD-10-CM | POA: Insufficient documentation

## 2021-04-28 DIAGNOSIS — K219 Gastro-esophageal reflux disease without esophagitis: Secondary | ICD-10-CM | POA: Diagnosis not present

## 2021-04-28 DIAGNOSIS — Z7982 Long term (current) use of aspirin: Secondary | ICD-10-CM | POA: Diagnosis not present

## 2021-04-28 DIAGNOSIS — E1122 Type 2 diabetes mellitus with diabetic chronic kidney disease: Secondary | ICD-10-CM | POA: Insufficient documentation

## 2021-04-28 DIAGNOSIS — I25118 Atherosclerotic heart disease of native coronary artery with other forms of angina pectoris: Secondary | ICD-10-CM | POA: Insufficient documentation

## 2021-04-28 DIAGNOSIS — Z5112 Encounter for antineoplastic immunotherapy: Secondary | ICD-10-CM | POA: Insufficient documentation

## 2021-04-28 DIAGNOSIS — Z79899 Other long term (current) drug therapy: Secondary | ICD-10-CM | POA: Insufficient documentation

## 2021-04-28 DIAGNOSIS — I2510 Atherosclerotic heart disease of native coronary artery without angina pectoris: Secondary | ICD-10-CM | POA: Diagnosis not present

## 2021-04-28 DIAGNOSIS — I10 Essential (primary) hypertension: Secondary | ICD-10-CM | POA: Insufficient documentation

## 2021-04-28 DIAGNOSIS — B359 Dermatophytosis, unspecified: Secondary | ICD-10-CM | POA: Insufficient documentation

## 2021-04-28 DIAGNOSIS — E1169 Type 2 diabetes mellitus with other specified complication: Secondary | ICD-10-CM | POA: Insufficient documentation

## 2021-04-28 DIAGNOSIS — C7970 Secondary malignant neoplasm of unspecified adrenal gland: Secondary | ICD-10-CM | POA: Insufficient documentation

## 2021-04-28 LAB — CBC WITH DIFFERENTIAL/PLATELET
Abs Immature Granulocytes: 0.01 10*3/uL (ref 0.00–0.07)
Basophils Absolute: 0 10*3/uL (ref 0.0–0.1)
Basophils Relative: 0 %
Eosinophils Absolute: 0.1 10*3/uL (ref 0.0–0.5)
Eosinophils Relative: 2 %
HCT: 37.5 % — ABNORMAL LOW (ref 39.0–52.0)
Hemoglobin: 12.5 g/dL — ABNORMAL LOW (ref 13.0–17.0)
Immature Granulocytes: 0 %
Lymphocytes Relative: 35 %
Lymphs Abs: 1.7 10*3/uL (ref 0.7–4.0)
MCH: 31 pg (ref 26.0–34.0)
MCHC: 33.3 g/dL (ref 30.0–36.0)
MCV: 93.1 fL (ref 80.0–100.0)
Monocytes Absolute: 0.4 10*3/uL (ref 0.1–1.0)
Monocytes Relative: 8 %
Neutro Abs: 2.7 10*3/uL (ref 1.7–7.7)
Neutrophils Relative %: 55 %
Platelets: 160 10*3/uL (ref 150–400)
RBC: 4.03 MIL/uL — ABNORMAL LOW (ref 4.22–5.81)
RDW: 13.3 % (ref 11.5–15.5)
WBC: 4.9 10*3/uL (ref 4.0–10.5)
nRBC: 0 % (ref 0.0–0.2)

## 2021-04-28 LAB — COMPREHENSIVE METABOLIC PANEL
ALT: 20 U/L (ref 0–44)
AST: 21 U/L (ref 15–41)
Albumin: 3.5 g/dL (ref 3.5–5.0)
Alkaline Phosphatase: 62 U/L (ref 38–126)
Anion gap: 7 (ref 5–15)
BUN: 27 mg/dL — ABNORMAL HIGH (ref 8–23)
CO2: 24 mmol/L (ref 22–32)
Calcium: 8.6 mg/dL — ABNORMAL LOW (ref 8.9–10.3)
Chloride: 107 mmol/L (ref 98–111)
Creatinine, Ser: 1.45 mg/dL — ABNORMAL HIGH (ref 0.61–1.24)
GFR, Estimated: 49 mL/min — ABNORMAL LOW (ref >60)
Glucose, Bld: 228 mg/dL — ABNORMAL HIGH (ref 70–99)
Potassium: 3.9 mmol/L (ref 3.5–5.1)
Sodium: 138 mmol/L (ref 135–145)
Total Bilirubin: 0.7 mg/dL (ref 0.3–1.2)
Total Protein: 6.6 g/dL (ref 6.5–8.1)

## 2021-04-28 MED ORDER — PROCHLORPERAZINE MALEATE 10 MG PO TABS: 10.0000 mg | ORAL_TABLET | Freq: Once | ORAL | Status: DC

## 2021-04-28 MED ORDER — SODIUM CHLORIDE 0.9 % IV SOLN
200.0000 mg | Freq: Once | INTRAVENOUS | Status: AC
  Administered 2021-04-28: 200 mg via INTRAVENOUS
  Filled 2021-04-28: qty 8

## 2021-04-28 MED ORDER — SODIUM CHLORIDE 0.9 % IV SOLN
Freq: Once | INTRAVENOUS | Status: AC
  Filled 2021-04-28: qty 250

## 2021-04-28 MED ORDER — HEPARIN SOD (PORK) LOCK FLUSH 100 UNIT/ML IV SOLN
INTRAVENOUS | Status: AC
  Filled 2021-04-28: qty 5

## 2021-04-28 MED ORDER — HEPARIN SOD (PORK) LOCK FLUSH 100 UNIT/ML IV SOLN
500.0000 [IU] | Freq: Once | INTRAVENOUS | Status: AC | PRN
  Administered 2021-04-28: 500 [IU]
  Filled 2021-04-28: qty 5

## 2021-04-28 NOTE — Patient Instructions (Signed)
[facility]  Discharge Instructions: Thank you for choosing [facility] to provide your oncology and hematology care.  If you have a lab appointment with the [facility], please go directly to the [facility] and check in at the registration area.  Wear comfortable clothing and clothing appropriate for easy access to any Portacath or PICC line.   We strive to give you quality time with your provider. You may need to reschedule your appointment if you arrive late (15 or more minutes).  Arriving late affects you and other patients whose appointments are after yours.  Also, if you miss three or more appointments without notifying the office, you may be dismissed from the clinic at the provider's discretion.      For prescription refill requests, have your pharmacy contact our office and allow 72 hours for refills to be completed.    Today you received the following chemotherapy and/or immunotherapy agents Keytruda Pembrolizumab injection What is this medicine? PEMBROLIZUMAB (pem broe liz ue mab) is a monoclonal antibody. It is used to treat certain types of cancer. This medicine may be used for other purposes; ask your health care provider or pharmacist if you have questions. COMMON BRAND NAME(S): Keytruda What should I tell my health care provider before I take this medicine? They need to know if you have any of these conditions:  autoimmune diseases like Crohn's disease, ulcerative colitis, or lupus  have had or planning to have an allogeneic stem cell transplant (uses someone else's stem cells)  history of organ transplant  history of chest radiation  nervous system problems like myasthenia gravis or Guillain-Barre syndrome  an unusual or allergic reaction to pembrolizumab, other medicines, foods, dyes, or preservatives  pregnant or trying to get pregnant  breast-feeding How should I use this medicine? This medicine is for  infusion into a vein. It is given by a health care professional in a hospital or clinic setting. A special MedGuide will be given to you before each treatment. Be sure to read this information carefully each time. Talk to your pediatrician regarding the use of this medicine in children. While this drug may be prescribed for children as young as 6 months for selected conditions, precautions do apply. Overdosage: If you think you have taken too much of this medicine contact a poison control center or emergency room at once. NOTE: This medicine is only for you. Do not share this medicine with others. What if I miss a dose? It is important not to miss your dose. Call your doctor or health care professional if you are unable to keep an appointment. What may interact with this medicine? Interactions have not been studied. This list may not describe all possible interactions. Give your health care provider a list of all the medicines, herbs, non-prescription drugs, or dietary supplements you use. Also tell them if you smoke, drink alcohol, or use illegal drugs. Some items may interact with your medicine. What should I watch for while using this medicine? Your condition will be monitored carefully while you are receiving this medicine. You may need blood work done while you are taking this medicine. Do not become pregnant while taking this medicine or for 4 months after stopping it. Women should inform their doctor if they wish to become pregnant or think they might be pregnant. There is a potential for serious side effects to an unborn child. Talk to your health care professional or pharmacist for more information. Do not  breast-feed an infant while taking this medicine or for 4 months after the last dose. What side effects may I notice from receiving this medicine? Side effects that you should report to your doctor or health care professional as soon as possible:  allergic reactions like skin rash,  itching or hives, swelling of the face, lips, or tongue  bloody or black, tarry  breathing problems  changes in vision  chest pain  chills  confusion  constipation  cough  diarrhea  dizziness or feeling faint or lightheaded  fast or irregular heartbeat  fever  flushing  joint pain  low blood counts - this medicine may decrease the number of white blood cells, red blood cells and platelets. You may be at increased risk for infections and bleeding.  muscle pain  muscle weakness  pain, tingling, numbness in the hands or feet  persistent headache  redness, blistering, peeling or loosening of the skin, including inside the mouth  signs and symptoms of high blood sugar such as dizziness; dry mouth; dry skin; fruity breath; nausea; stomach pain; increased hunger or thirst; increased urination  signs and symptoms of kidney injury like trouble passing urine or change in the amount of urine  signs and symptoms of liver injury like dark urine, light-colored stools, loss of appetite, nausea, right upper belly pain, yellowing of the eyes or skin  sweating  swollen lymph nodes  weight loss Side effects that usually do not require medical attention (report to your doctor or health care professional if they continue or are bothersome):  decreased appetite  hair loss  tiredness This list may not describe all possible side effects. Call your doctor for medical advice about side effects. You may report side effects to FDA at 1-800-FDA-1088. Where should I keep my medicine? This drug is given in a hospital or clinic and will not be stored at home. NOTE: This sheet is a summary. It may not cover all possible information. If you have questions about this medicine, talk to your doctor, pharmacist, or health care provider.  2021 Elsevier/Gold Standard (2019-11-08 21:44:53)       To help prevent nausea and vomiting after your treatment, we encourage you to take your nausea  medication as directed.  BELOW ARE SYMPTOMS THAT SHOULD BE REPORTED IMMEDIATELY: . *FEVER GREATER THAN 100.4 F (38 C) OR HIGHER . *CHILLS OR SWEATING . *NAUSEA AND VOMITING THAT IS NOT CONTROLLED WITH YOUR NAUSEA MEDICATION . *UNUSUAL SHORTNESS OF BREATH . *UNUSUAL BRUISING OR BLEEDING . *URINARY PROBLEMS (pain or burning when urinating, or frequent urination) . *BOWEL PROBLEMS (unusual diarrhea, constipation, pain near the anus) . TENDERNESS IN MOUTH AND THROAT WITH OR WITHOUT PRESENCE OF ULCERS (sore throat, sores in mouth, or a toothache) . UNUSUAL RASH, SWELLING OR PAIN  . UNUSUAL VAGINAL DISCHARGE OR ITCHING   Items with * indicate a potential emergency and should be followed up as soon as possible or go to the Emergency Department if any problems should occur.  Please show the CHEMOTHERAPY ALERT CARD or IMMUNOTHERAPY ALERT CARD at check-in to the Emergency Department and triage nurse.  Should you have questions after your visit or need to cancel or reschedule your appointment, please contact [facility]  [phone] and follow the prompts.  Office hours are 8:00 a.m. to 4:30 p.m. Monday - Friday. Please note that voicemails left after 4:00 p.m. may not be returned until the following business day.  We are closed weekends and major holidays. You  have access to a nurse at all times for urgent questions. Please call the main number to the clinic [phone] and follow the prompts.  For any non-urgent questions, you may also contact your provider using MyChart. We now offer e-Visits for anyone 6 and older to request care online for non-urgent symptoms. For details visit [email or web address].   Also download the MyChart app! Go to the app store, search "MyChart", open the app, select [facility], and log in with your MyChart username and password.  Due to Covid, a mask is required upon entering the hospital/clinic. If you do not have a mask, one  will be given to you upon arrival. For doctor visits, patients may have 1 support person aged 10 or older with them. For treatment visits, patients cannot have anyone with them due to current Covid guidelines and our immunocompromised population.

## 2021-04-28 NOTE — Progress Notes (Signed)
[facility] NOTE  Patient Care Team: Ezequiel Kayser, MD as PCP - General (Internal Medicine) Telford Nab, RN as Oncology Nurse Navigator  CHIEF COMPLAINTS/PURPOSE OF CONSULTATION: lung cancer  #  Oncology History Overview Note  # July 2021- RLL- 9.4 cm x 7.5 cm x 6.7 cm lobulated low-attenuation heterogeneous lung mass within the posteromedial aspect of the right lower lobe, at the level of the right hilum; positive for hilar; mediastinal adenopathy; PET scan-July 2021 + for adrenal metastases; CT-guided biopsy-non-small cell favor adenocarcinoma; necrosis  # carbo-alimtac  [name]; NGS pending] cycle #2 carbo Alimta [place]; 10/18/2020-maintenance Keytruda  # 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 CKD  # NGS/MOLECULAR TESTS: TPS =70%; rest **  # PALLIATIVE CARE EVALUATION:     DIAGNOSIS: Lung cancer  STAGE: 4       ;  GOALS: Palliative  CURRENT/MOST RECENT THERAPY : Carbo Alimta Keytruda    Cancer of lower lobe of right lung ([place])  07/05/2020 Initial Diagnosis   Cancer of lower lobe of right lung ([place])   07/26/2020 -  Chemotherapy    Patient is on Treatment Plan: LUNG CARBOPLATIN / PEMETREXED / PEMBROLIZUMAB Q21D INDUCTION X 4 CYCLES / MAINTENANCE PEMETREXED + PEMBROLIZUMAB         HISTORY OF PRESENTING ILLNESS:  Kavin Watford 80 y.o.  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  Patient denies any nausea vomiting.  Appetite is good.  No weight loss.  No swelling the legs.  No chest pain or shortness of breath or cough.  Planning to go on [place] vacation tomorrow.  Review of Systems  Constitutional: Negative for chills, diaphoresis, fever, malaise/fatigue and weight loss.  HENT: Negative for nosebleeds and sore throat.   Eyes: Negative for double vision.  Respiratory: Negative for  hemoptysis, sputum production and shortness of breath.   Cardiovascular: Negative for chest pain, palpitations, orthopnea and leg swelling.  Gastrointestinal: Negative for abdominal pain, blood in stool, constipation, diarrhea, heartburn, melena, nausea and vomiting.  Genitourinary: Negative for dysuria, frequency and urgency.  Musculoskeletal: Negative for back pain and joint pain.  Skin: Negative.  Negative for itching and rash.  Neurological: Negative for dizziness, tingling, focal weakness, weakness and headaches.  Endo/Heme/Allergies: Does not bruise/bleed easily.  Psychiatric/Behavioral: Negative for depression. The patient is not nervous/anxious.      MEDICAL HISTORY:  Past Medical History:  Diagnosis Date  . Chronic renal insufficiency   . Diabetes mellitus without complication ([place])    Pt states it's diet controlled.  Marland Kitchen GERD (gastroesophageal reflux disease)   . Hyperlipidemia associated with type 2 diabetes mellitus ([place])   . Hypertension   . Lung mass   . Non-small cell lung cancer, right ([place]) 06/2020    SURGICAL HISTORY: Past Surgical History:  Procedure Laterality Date  . IR IMAGING GUIDED PORT INSERTION  07/22/2020  . lymphoma removal     back    SOCIAL HISTORY: Social History   Socioeconomic History  . Marital status: Married    Spouse name: Not on file  . Number of children: Not on file  . Years of education: Not on file  . Highest education level: Not on file  Occupational History  . Not on file  Tobacco Use  . Smoking status: Former Smoker    Quit date: 07/23/2007    Years since  quitting: 13.7  . Smokeless tobacco: Never Used  Vaping Use  . Vaping Use: Never used  Substance and Sexual Activity  . Alcohol use: Not Currently    Comment: none for 5 months  . Drug use: Never  . Sexual activity: Not on file  Other Topics Concern  . Not on file  Social History Narrative   Moved from [place]; lives in [place] in 2021; daughter lives next door.  Quit smoking- 13 years ago. Social alcohol. 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    Social Determinants of Health   Financial Resource Strain: Not on file  Food Insecurity: Not on file  Transportation Needs: Not on file  Physical Activity: Not on file  Stress: Not on file  Social Connections: Not on file  Intimate Partner Violence: Not on file    FAMILY HISTORY: Family History  Problem Relation Age of Onset  . Liver cancer Sister   . Cancer - Other Brother        bone cancer [2001]    ALLERGIES:  is allergic to niacin and related.  MEDICATIONS:  Current Outpatient Medications  Medication Sig Dispense Refill  . acetaminophen (TYLENOL) 500 MG tablet Take 2 tablets by mouth every 8 (eight) hours as needed.    Marland Kitchen aspirin 325 MG EC tablet Take 1 tablet by mouth daily.    Marland Kitchen atenolol (TENORMIN) 25 MG tablet Take 1 tablet by mouth daily.    Marland Kitchen atorvastatin (LIPITOR) 40 MG tablet Take 1 tablet by mouth daily.    . hydrochlorothiazide (HYDRODIURIL) 12.5 MG tablet Take 12.5 mg by mouth daily.    Marland Kitchen lidocaine-prilocaine (EMLA) cream Apply 1 application topically as needed. 30 g 4  . losartan (COZAAR) 50 MG tablet Take 1 tablet by mouth daily.    . Melatonin 10 MG TABS Take 1 tablet by mouth at bedtime.    . Omega-3 1000 MG CAPS Take 1,000 mg by mouth in the morning and at bedtime.    Marland Kitchen omeprazole (PRILOSEC) 20 MG capsule Take 20 mg by mouth in the morning and at bedtime.    . ondansetron (ZOFRAN) 8 MG tablet One pill every 8 hours as needed for nausea/vomitting. 40 tablet 1  . prochlorperazine (COMPAZINE) 10 MG tablet Take 1 tablet (10 mg total) by mouth every 6 (six) hours as needed for nausea or vomiting. 40 tablet 1  . tadalafil (CIALIS) 20 MG tablet Take 1 tablet by mouth daily.     No current facility-administered medications for this visit.   Facility-Administered Medications Ordered in Other Visits  Medication Dose Route Frequency Provider Last Rate Last  Admin  . heparin lock flush 100 unit/mL  500 Units Intracatheter Once PRN Cammie Sickle, MD      . pembrolizumab [facility]) 200 mg in sodium chloride 0.9 % 50 mL chemo infusion  200 mg Intravenous Once Charlaine Dalton R, MD      . prochlorperazine (COMPAZINE) tablet 10 mg  10 mg Oral Once Cammie Sickle, MD          .  PHYSICAL EXAMINATION: ECOG PERFORMANCE STATUS: 1 - Symptomatic but completely ambulatory  Vitals:   04/28/21 0900  BP: 113/62  Pulse: (!) 59  Resp: 18  Temp: (!) 95 F (35 C)   Filed Weights   04/28/21 0900  Weight: 234 lb (106.1 kg)    Physical Exam Constitutional:      Comments: Ambulating independently.  Accompanied by his wife.   HENT:  Head: Normocephalic and atraumatic.     Mouth/Throat:     Pharynx: No oropharyngeal exudate.  Eyes:     Pupils: Pupils are equal, round, and reactive to light.  Cardiovascular:     Rate and Rhythm: Normal rate and regular rhythm.  Pulmonary:     Effort: Pulmonary effort is normal. No respiratory distress.     Breath sounds: Normal breath sounds. No wheezing.  Abdominal:     General: Bowel sounds are normal. There is no distension.     Palpations: Abdomen is soft. There is no mass.     Tenderness: There is no abdominal tenderness. There is no guarding or rebound.  Musculoskeletal:        General: No tenderness. Normal range of motion.     Cervical back: Normal range of motion and neck supple.  Skin:    General: Skin is warm.  Neurological:     Mental Status: He is alert and oriented to person, place, and time.  Psychiatric:        Mood and Affect: Affect normal.      LABORATORY DATA:  I have reviewed the data as listed Lab Results  Component Value Date   WBC 4.9 04/28/2021   HGB 12.5 (L) 04/28/2021   HCT 37.5 (L) 04/28/2021   MCV 93.1 04/28/2021   PLT 160 04/28/2021   Recent Labs    08/27/20 1127 09/06/20 0806 09/20/20 0846 09/27/20 0821 03/17/21 0846 04/07/21 0910  04/28/21 0842  NA 134* 139 144   < > 138 138 138  K 4.4 4.7 3.5   < > 3.6 3.9 3.9  CL 99 106 109   < > 105 106 107  CO2 27 22 25    < > 24 23 24   GLUCOSE 115* 210* 164*   < > 178* 111* 228*  BUN 36* 31* 24*   < > 26* 30* 27*  CREATININE 1.30* 1.37* 1.29*   < > 1.27* 1.47* 1.45*  CALCIUM 8.1* 8.5* 8.4*   < > 8.9 9.1 8.6*  GFRNONAA 53* 49* 53*   < > 58* 49* 49*  GFRAA >60 57* >60  --   --   --   --   PROT  --  6.5 6.4*   < > 6.8 7.2 6.6  ALBUMIN  --  3.2* 3.2*   < > 3.9 3.9 3.5  AST  --  39 72*   < > 28 24 21   ALT  --  117* 106*   < > 25 23 20   ALKPHOS  --  67 68   < > 65 64 62  BILITOT  --  0.8 0.8   < > 0.7 1.0 0.7   < > = values in this interval not displayed.    RADIOGRAPHIC STUDIES: I have personally reviewed the radiological images as listed and agreed with the findings in the report. No results found.  ASSESSMENT & PLAN:   Cancer of lower lobe of right lung ([place]) # Lung cancer-non-small cell favor adeno ca; Stage IV; MARCH 24th, 2022- stable; change in the posterior right perihilar lung.  No evidence of any tumor recurrence noted.  # proceed with Keytruda maintenance Labs today reviewed;  acceptable for treatment today.    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  # Elevated Blood glucose- 228 PBF; check Hb1aC. Will monitor for now.  #  Vacation: [place] discussed regarding risk of DVT/PE-especially with underlying malignancy on treatment.  Recommend getting up and moving around/hydration; and to seek medical care if any significant concerns of leg swelling or chest pain.  # DISPOSITION: # Keytruda today # follow up in 3 weeks-MD; labs- cbc/cmp/check Hb1aC;  Keytruda;Dr.B     All questions were answered. The patient knows to call the clinic with any problems, questions or concerns.    Cammie Sickle, MD 04/28/2021 10:04  AM

## 2021-04-28 NOTE — Assessment & Plan Note (Addendum)
#   Lung cancer-non-small cell favor adeno ca; Stage IV; MARCH 24th, 2022- stable; change in the posterior right perihilar lung.  No evidence of any tumor recurrence noted.  # proceed with Keytruda maintenance Labs today reviewed;  acceptable for treatment today.    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  # Elevated Blood glucose- 228 PBF; check Hb1aC. Will monitor for now.  # Vacation: [place] discussed regarding risk of DVT/PE-especially with underlying malignancy on treatment.  Recommend getting up and moving around/hydration; and to seek medical care if any significant concerns of leg swelling or chest pain.  # DISPOSITION: # Keytruda today # follow up in 3 weeks-MD; labs- cbc/cmp/check Hb1aC;  Keytruda;Dr.B

## 2021-05-20 ENCOUNTER — Inpatient Hospital Stay (HOSPITAL_BASED_OUTPATIENT_CLINIC_OR_DEPARTMENT_OTHER): Payer: Medicare PPO | Admitting: Internal Medicine

## 2021-05-20 ENCOUNTER — Encounter: Payer: Self-pay | Admitting: Internal Medicine

## 2021-05-20 ENCOUNTER — Other Ambulatory Visit: Payer: Self-pay

## 2021-05-20 ENCOUNTER — Inpatient Hospital Stay: Payer: Medicare PPO

## 2021-05-20 DIAGNOSIS — E1122 Type 2 diabetes mellitus with diabetic chronic kidney disease: Secondary | ICD-10-CM

## 2021-05-20 DIAGNOSIS — C3431 Malignant neoplasm of lower lobe, right bronchus or lung: Secondary | ICD-10-CM | POA: Diagnosis not present

## 2021-05-20 DIAGNOSIS — Z5112 Encounter for antineoplastic immunotherapy: Secondary | ICD-10-CM | POA: Diagnosis not present

## 2021-05-20 DIAGNOSIS — N183 Chronic kidney disease, stage 3 unspecified: Secondary | ICD-10-CM

## 2021-05-20 LAB — CBC WITH DIFFERENTIAL/PLATELET
Abs Immature Granulocytes: 0.01 10*3/uL (ref 0.00–0.07)
Basophils Absolute: 0 10*3/uL (ref 0.0–0.1)
Basophils Relative: 0 %
Eosinophils Absolute: 0.1 10*3/uL (ref 0.0–0.5)
Eosinophils Relative: 3 %
HCT: 35.8 % — ABNORMAL LOW (ref 39.0–52.0)
Hemoglobin: 12 g/dL — ABNORMAL LOW (ref 13.0–17.0)
Immature Granulocytes: 0 %
Lymphocytes Relative: 48 %
Lymphs Abs: 2.4 10*3/uL (ref 0.7–4.0)
MCH: 31.2 pg (ref 26.0–34.0)
MCHC: 33.5 g/dL (ref 30.0–36.0)
MCV: 93 fL (ref 80.0–100.0)
Monocytes Absolute: 0.5 10*3/uL (ref 0.1–1.0)
Monocytes Relative: 10 %
Neutro Abs: 2 10*3/uL (ref 1.7–7.7)
Neutrophils Relative %: 39 %
Platelets: 164 10*3/uL (ref 150–400)
RBC: 3.85 MIL/uL — ABNORMAL LOW (ref 4.22–5.81)
RDW: 13.5 % (ref 11.5–15.5)
WBC: 5 10*3/uL (ref 4.0–10.5)
nRBC: 0 % (ref 0.0–0.2)

## 2021-05-20 LAB — COMPREHENSIVE METABOLIC PANEL
ALT: 25 U/L (ref 0–44)
AST: 24 U/L (ref 15–41)
Albumin: 3.5 g/dL (ref 3.5–5.0)
Alkaline Phosphatase: 76 U/L (ref 38–126)
Anion gap: 9 (ref 5–15)
BUN: 34 mg/dL — ABNORMAL HIGH (ref 8–23)
CO2: 22 mmol/L (ref 22–32)
Calcium: 8.7 mg/dL — ABNORMAL LOW (ref 8.9–10.3)
Chloride: 108 mmol/L (ref 98–111)
Creatinine, Ser: 1.4 mg/dL — ABNORMAL HIGH (ref 0.61–1.24)
GFR, Estimated: 51 mL/min — ABNORMAL LOW (ref >60)
Glucose, Bld: 122 mg/dL — ABNORMAL HIGH (ref 70–99)
Potassium: 3.7 mmol/L (ref 3.5–5.1)
Sodium: 139 mmol/L (ref 135–145)
Total Bilirubin: 0.8 mg/dL (ref 0.3–1.2)
Total Protein: 6.7 g/dL (ref 6.5–8.1)

## 2021-05-20 MED ORDER — SODIUM CHLORIDE 0.9 % IV SOLN
200.0000 mg | Freq: Once | INTRAVENOUS | Status: AC
  Administered 2021-05-20: 200 mg via INTRAVENOUS
  Filled 2021-05-20: qty 8

## 2021-05-20 MED ORDER — HEPARIN SOD (PORK) LOCK FLUSH 100 UNIT/ML IV SOLN
500.0000 [IU] | Freq: Once | INTRAVENOUS | Status: AC
  Administered 2021-05-20: 500 [IU] via INTRAVENOUS
  Filled 2021-05-20: qty 5

## 2021-05-20 MED ORDER — SODIUM CHLORIDE 0.9 % IV SOLN
Freq: Once | INTRAVENOUS | Status: AC
  Filled 2021-05-20: qty 250

## 2021-05-20 MED ORDER — PROCHLORPERAZINE MALEATE 10 MG PO TABS: 10.0000 mg | ORAL_TABLET | Freq: Once | ORAL | Status: DC

## 2021-05-20 MED ORDER — SODIUM CHLORIDE 0.9% FLUSH
10.0000 mL | Freq: Once | INTRAVENOUS | Status: AC
  Administered 2021-05-20: 10 mL via INTRAVENOUS
  Filled 2021-05-20: qty 10

## 2021-05-20 NOTE — Progress Notes (Signed)
States that he itching that occurs under armpits and in the groin area mainly when he sweats. It is on and off. Knows it is a side effect of keytruda and did not know if it was related.

## 2021-05-20 NOTE — Assessment & Plan Note (Addendum)
#   Lung cancer-non-small cell favor adeno ca; Stage IV; MARCH 24th, 2022- stable; change in the posterior right perihilar lung.  No evidence of any tumor recurrence noted.  # proceed with Keytruda maintenance Labs today reviewed;  acceptable for treatment today. Will plan scans in end of June 2022.    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  # Elevated Blood glucose- 128 PBF; awaiting- Hb1aC. Will monitor for now.  # tinea dermatitis-left axilla/groin-- recommend topical anti-fungal BID  # DISPOSITION: # Keytruda today # follow up on June 20th-MD; labs- cbc/cmp;  Keytruda;Dr.B

## 2021-05-20 NOTE — Progress Notes (Signed)
[facility] NOTE  Patient Care Team: Ezequiel Kayser, MD as PCP - General (Internal Medicine) Telford Nab, RN as Oncology Nurse Navigator  CHIEF COMPLAINTS/PURPOSE OF CONSULTATION: lung cancer  #  Oncology History Overview Note  # July 2021- RLL- 9.4 cm x 7.5 cm x 6.7 cm lobulated low-attenuation heterogeneous lung mass within the posteromedial aspect of the right lower lobe, at the level of the right hilum; positive for hilar; mediastinal adenopathy; PET scan-July 2021 + for adrenal metastases; CT-guided biopsy-non-small cell favor adenocarcinoma; necrosis  # carbo-alimtac  [name]; NGS pending] cycle #2 carbo Alimta [place]; 10/18/2020-maintenance Keytruda  # 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 CKD  # NGS/MOLECULAR TESTS: TPS =70%; rest **  # PALLIATIVE CARE EVALUATION:     DIAGNOSIS: Lung cancer  STAGE: 4       ;  GOALS: Palliative  CURRENT/MOST RECENT THERAPY : Carbo Alimta Keytruda    Cancer of lower lobe of right lung ([place])  07/05/2020 Initial Diagnosis   Cancer of lower lobe of right lung ([place])   07/26/2020 -  Chemotherapy    Patient is on Treatment Plan: LUNG CARBOPLATIN / PEMETREXED / PEMBROLIZUMAB Q21D INDUCTION X 4 CYCLES / MAINTENANCE PEMETREXED + PEMBROLIZUMAB         HISTORY OF PRESENTING ILLNESS:  Ralph Williams 80 y.o.  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  Patient returned from [place] vacation.  Trip uneventful.   No nausea no vomiting no diarrhea.  No shortness of breath or cough.  Complains of rash and itch left underarm and the groin.  Review of Systems  Constitutional: Negative for chills, diaphoresis, fever, malaise/fatigue and weight loss.  HENT: Negative for nosebleeds and sore throat.   Eyes: Negative for double vision.  Respiratory: Negative  for hemoptysis, sputum production and shortness of breath.   Cardiovascular: Negative for chest pain, palpitations, orthopnea and leg swelling.  Gastrointestinal: Negative for abdominal pain, blood in stool, constipation, diarrhea, heartburn, melena, nausea and vomiting.  Genitourinary: Negative for dysuria, frequency and urgency.  Musculoskeletal: Negative for back pain and joint pain.  Skin: Positive for itching and rash.  Neurological: Negative for dizziness, tingling, focal weakness, weakness and headaches.  Endo/Heme/Allergies: Does not bruise/bleed easily.  Psychiatric/Behavioral: Negative for depression. The patient is not nervous/anxious.      MEDICAL HISTORY:  Past Medical History:  Diagnosis Date  . Chronic renal insufficiency   . Diabetes mellitus without complication ([place])    Pt states it's diet controlled.  Marland Kitchen GERD (gastroesophageal reflux disease)   . Hyperlipidemia associated with type 2 diabetes mellitus ([place])   . Hypertension   . Lung mass   . Non-small cell lung cancer, right ([place]) 06/2020    SURGICAL HISTORY: Past Surgical History:  Procedure Laterality Date  . IR IMAGING GUIDED PORT INSERTION  07/22/2020  . lymphoma removal     back    SOCIAL HISTORY: Social History   Socioeconomic History  . Marital status: Married    Spouse name: Not on file  . Number of children: Not on file  . Years of education: Not on file  . Highest education level: Not on file  Occupational History  . Not on file  Tobacco Use  . Smoking status: Former Smoker    Quit date: 07/23/2007    Years since quitting: 13.8  .  Smokeless tobacco: Never Used  Vaping Use  . Vaping Use: Never used  Substance and Sexual Activity  . Alcohol use: Not Currently    Comment: none for 5 months  . Drug use: Never  . Sexual activity: Not on file  Other Topics Concern  . Not on file  Social History Narrative   Moved from [place]; lives in [place] in 2021; daughter lives next door. Quit  smoking- 13 years ago. Social alcohol. 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    Social Determinants of Health   Financial Resource Strain: Not on file  Food Insecurity: Not on file  Transportation Needs: Not on file  Physical Activity: Not on file  Stress: Not on file  Social Connections: Not on file  Intimate Partner Violence: Not on file    FAMILY HISTORY: Family History  Problem Relation Age of Onset  . Liver cancer Sister   . Cancer - Other Brother        bone cancer [2001]    ALLERGIES:  is allergic to niacin and related.  MEDICATIONS:  Current Outpatient Medications  Medication Sig Dispense Refill  . acetaminophen (TYLENOL) 500 MG tablet Take 2 tablets by mouth every 8 (eight) hours as needed.    Marland Kitchen aspirin 325 MG EC tablet Take 1 tablet by mouth daily.    Marland Kitchen atenolol (TENORMIN) 25 MG tablet Take 1 tablet by mouth daily.    Marland Kitchen atorvastatin (LIPITOR) 40 MG tablet Take 1 tablet by mouth daily.    . hydrochlorothiazide (HYDRODIURIL) 12.5 MG tablet Take 12.5 mg by mouth daily.    Marland Kitchen lidocaine-prilocaine (EMLA) cream Apply 1 application topically as needed. 30 g 4  . losartan (COZAAR) 50 MG tablet Take 1 tablet by mouth daily.    . Melatonin 10 MG TABS Take 1 tablet by mouth at bedtime.    . Omega-3 1000 MG CAPS Take 1,000 mg by mouth in the morning and at bedtime.    Marland Kitchen omeprazole (PRILOSEC) 20 MG capsule Take 20 mg by mouth in the morning and at bedtime.    . ondansetron (ZOFRAN) 8 MG tablet One pill every 8 hours as needed for nausea/vomitting. 40 tablet 1  . prochlorperazine (COMPAZINE) 10 MG tablet Take 1 tablet (10 mg total) by mouth every 6 (six) hours as needed for nausea or vomiting. 40 tablet 1  . tadalafil (CIALIS) 20 MG tablet Take 1 tablet by mouth daily.     No current facility-administered medications for this visit.   Facility-Administered Medications Ordered in Other Visits  Medication Dose Route Frequency Provider Last Rate Last Admin   . prochlorperazine (COMPAZINE) tablet 10 mg  10 mg Oral Once Cammie Sickle, MD          .  PHYSICAL EXAMINATION: ECOG PERFORMANCE STATUS: 1 - Symptomatic but completely ambulatory  Vitals:   05/20/21 0853  BP: (!) 114/59  Pulse: (!) 54  Resp: 16  Temp: (!) 96.8 F (36 C)  SpO2: 98%   Filed Weights   05/20/21 0853  Weight: 111.6 kg    Physical Exam Constitutional:      Comments: Ambulating independently.  Accompanied by his wife.   HENT:     Head: Normocephalic and atraumatic.     Mouth/Throat:     Pharynx: No oropharyngeal exudate.  Eyes:     Pupils: Pupils are equal, round, and reactive to light.  Cardiovascular:     Rate and Rhythm: Normal rate and regular rhythm.  Pulmonary:  Effort: Pulmonary effort is normal. No respiratory distress.     Breath sounds: Normal breath sounds. No wheezing.  Abdominal:     General: Bowel sounds are normal. There is no distension.     Palpations: Abdomen is soft. There is no mass.     Tenderness: There is no abdominal tenderness. There is no guarding or rebound.  Musculoskeletal:        General: No tenderness. Normal range of motion.     Cervical back: Normal range of motion and neck supple.  Skin:    General: Skin is warm.     Comments: Left underarm-ringworm lesion  Neurological:     Mental Status: He is alert and oriented to person, place, and time.  Psychiatric:        Mood and Affect: Affect normal.      LABORATORY DATA:  I have reviewed the data as listed Lab Results  Component Value Date   WBC 5.0 05/20/2021   HGB 12.0 (L) 05/20/2021   HCT 35.8 (L) 05/20/2021   MCV 93.0 05/20/2021   PLT 164 05/20/2021   Recent Labs    08/27/20 1127 09/06/20 0806 09/20/20 0846 09/27/20 0821 04/07/21 0910 04/28/21 0842 05/20/21 0836  NA 134* 139 144   < > 138 138 139  K 4.4 4.7 3.5   < > 3.9 3.9 3.7  CL 99 106 109   < > 106 107 108  CO2 27 22 25    < > 23 24 22   GLUCOSE 115* 210* 164*   < > 111* 228* 122*   BUN 36* 31* 24*   < > 30* 27* 34*  CREATININE 1.30* 1.37* 1.29*   < > 1.47* 1.45* 1.40*  CALCIUM 8.1* 8.5* 8.4*   < > 9.1 8.6* 8.7*  GFRNONAA 53* 49* 53*   < > 49* 49* 51*  GFRAA >60 57* >60  --   --   --   --   PROT  --  6.5 6.4*   < > 7.2 6.6 6.7  ALBUMIN  --  3.2* 3.2*   < > 3.9 3.5 3.5  AST  --  39 72*   < > 24 21 24   ALT  --  117* 106*   < > 23 20 25   ALKPHOS  --  67 68   < > 64 62 76  BILITOT  --  0.8 0.8   < > 1.0 0.7 0.8   < > = values in this interval not displayed.    RADIOGRAPHIC STUDIES: I have personally reviewed the radiological images as listed and agreed with the findings in the report. No results found.  ASSESSMENT & PLAN:   Cancer of lower lobe of right lung ([place]) # Lung cancer-non-small cell favor adeno ca; Stage IV; MARCH 24th, 2022- stable; change in the posterior right perihilar lung.  No evidence of any tumor recurrence noted.  # proceed with Keytruda maintenance Labs today reviewed;  acceptable for treatment today. Will plan scans in end of June 2022.    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  # Elevated Blood glucose- 128 PBF; awaiting- Hb1aC. Will monitor for now.  # tinea dermatitis-left axilla/groin-- recommend topical anti-fungal BID  # DISPOSITION: # Keytruda today # follow up on June 20th-MD; labs- cbc/cmp;  Keytruda;Dr.B     All questions were answered. The patient knows to call  the clinic with any problems, questions or concerns.    Cammie Sickle, MD 05/20/2021 12:57 PM

## 2021-05-20 NOTE — Patient Instructions (Signed)
[facility]  Discharge Instructions: Thank you for choosing [facility] to provide your oncology and hematology care.  If you have a lab appointment with the [facility], please go directly to the [facility] and check in at the registration area.  Wear comfortable clothing and clothing appropriate for easy access to any Portacath or PICC line.   We strive to give you quality time with your provider. You may need to reschedule your appointment if you arrive late (15 or more minutes).  Arriving late affects you and other patients whose appointments are after yours.  Also, if you miss three or more appointments without notifying the office, you may be dismissed from the clinic at the provider's discretion.      For prescription refill requests, have your pharmacy contact our office and allow 72 hours for refills to be completed.    Today you received the following chemotherapy and/or immunotherapy agents: Keytruda      To help prevent nausea and vomiting after your treatment, we encourage you to take your nausea medication as directed.  BELOW ARE SYMPTOMS THAT SHOULD BE REPORTED IMMEDIATELY: . *FEVER GREATER THAN 100.4 F (38 C) OR HIGHER . *CHILLS OR SWEATING . *NAUSEA AND VOMITING THAT IS NOT CONTROLLED WITH YOUR NAUSEA MEDICATION . *UNUSUAL SHORTNESS OF BREATH . *UNUSUAL BRUISING OR BLEEDING . *URINARY PROBLEMS (pain or burning when urinating, or frequent urination) . *BOWEL PROBLEMS (unusual diarrhea, constipation, pain near the anus) . TENDERNESS IN MOUTH AND THROAT WITH OR WITHOUT PRESENCE OF ULCERS (sore throat, sores in mouth, or a toothache) . UNUSUAL RASH, SWELLING OR PAIN  . UNUSUAL VAGINAL DISCHARGE OR ITCHING   Items with * indicate a potential emergency and should be followed up as soon as possible or go to the Emergency Department if any problems should occur.  Please show the CHEMOTHERAPY ALERT CARD or IMMUNOTHERAPY  ALERT CARD at check-in to the Emergency Department and triage nurse.  Should you have questions after your visit or need to cancel or reschedule your appointment, please contact [facility]  [phone] and follow the prompts.  Office hours are 8:00 a.m. to 4:30 p.m. Monday - Friday. Please note that voicemails left after 4:00 p.m. may not be returned until the following business day.  We are closed weekends and major holidays. You have access to a nurse at all times for urgent questions. Please call the main number to the clinic [phone] and follow the prompts.  For any non-urgent questions, you may also contact your provider using MyChart. We now offer e-Visits for anyone 33 and older to request care online for non-urgent symptoms. For details visit [email or web address].   Also download the MyChart app! Go to the app store, search "MyChart", open the app, select , and log in with your MyChart username and password.  Due to Covid, a mask is required upon entering the hospital/clinic. If you do not have a mask, one will be given to you upon arrival. For doctor visits, patients may have 1 support person aged 11 or older with them. For treatment visits, patients cannot have anyone with them due to current Covid guidelines and our immunocompromised population.

## 2021-05-21 LAB — HEMOGLOBIN A1C
Hgb A1c MFr Bld: 6.5 % — ABNORMAL HIGH (ref 4.8–5.6)
Mean Plasma Glucose: 140 mg/dL

## 2021-06-09 ENCOUNTER — Inpatient Hospital Stay (HOSPITAL_BASED_OUTPATIENT_CLINIC_OR_DEPARTMENT_OTHER): Payer: Medicare PPO | Admitting: Internal Medicine

## 2021-06-09 ENCOUNTER — Encounter: Payer: Self-pay | Admitting: Internal Medicine

## 2021-06-09 ENCOUNTER — Inpatient Hospital Stay: Payer: Medicare PPO

## 2021-06-09 ENCOUNTER — Inpatient Hospital Stay: Payer: Medicare PPO | Attending: Internal Medicine

## 2021-06-09 ENCOUNTER — Other Ambulatory Visit: Payer: Self-pay

## 2021-06-09 VITALS — BP 125/61 | HR 55 | Temp 96.7°F | Resp 18 | Wt 241.8 lb

## 2021-06-09 DIAGNOSIS — Z5112 Encounter for antineoplastic immunotherapy: Secondary | ICD-10-CM | POA: Diagnosis not present

## 2021-06-09 DIAGNOSIS — N183 Chronic kidney disease, stage 3 unspecified: Secondary | ICD-10-CM | POA: Insufficient documentation

## 2021-06-09 DIAGNOSIS — C3431 Malignant neoplasm of lower lobe, right bronchus or lung: Secondary | ICD-10-CM

## 2021-06-09 DIAGNOSIS — Z87891 Personal history of nicotine dependence: Secondary | ICD-10-CM | POA: Diagnosis not present

## 2021-06-09 DIAGNOSIS — I10 Essential (primary) hypertension: Secondary | ICD-10-CM | POA: Diagnosis not present

## 2021-06-09 DIAGNOSIS — Z79899 Other long term (current) drug therapy: Secondary | ICD-10-CM | POA: Insufficient documentation

## 2021-06-09 DIAGNOSIS — Z7982 Long term (current) use of aspirin: Secondary | ICD-10-CM | POA: Insufficient documentation

## 2021-06-09 DIAGNOSIS — E119 Type 2 diabetes mellitus without complications: Secondary | ICD-10-CM | POA: Diagnosis not present

## 2021-06-09 LAB — CBC WITH DIFFERENTIAL/PLATELET
Abs Immature Granulocytes: 0.01 10*3/uL (ref 0.00–0.07)
Basophils Absolute: 0 10*3/uL (ref 0.0–0.1)
Basophils Relative: 0 %
Eosinophils Absolute: 0.1 10*3/uL (ref 0.0–0.5)
Eosinophils Relative: 2 %
HCT: 36.5 % — ABNORMAL LOW (ref 39.0–52.0)
Hemoglobin: 12.3 g/dL — ABNORMAL LOW (ref 13.0–17.0)
Immature Granulocytes: 0 %
Lymphocytes Relative: 49 %
Lymphs Abs: 2.5 10*3/uL (ref 0.7–4.0)
MCH: 30.8 pg (ref 26.0–34.0)
MCHC: 33.7 g/dL (ref 30.0–36.0)
MCV: 91.3 fL (ref 80.0–100.0)
Monocytes Absolute: 0.5 10*3/uL (ref 0.1–1.0)
Monocytes Relative: 10 %
Neutro Abs: 2 10*3/uL (ref 1.7–7.7)
Neutrophils Relative %: 39 %
Platelets: 169 10*3/uL (ref 150–400)
RBC: 4 MIL/uL — ABNORMAL LOW (ref 4.22–5.81)
RDW: 13 % (ref 11.5–15.5)
WBC: 5.2 10*3/uL (ref 4.0–10.5)
nRBC: 0 % (ref 0.0–0.2)

## 2021-06-09 LAB — COMPREHENSIVE METABOLIC PANEL
ALT: 22 U/L (ref 0–44)
AST: 23 U/L (ref 15–41)
Albumin: 3.7 g/dL (ref 3.5–5.0)
Alkaline Phosphatase: 74 U/L (ref 38–126)
Anion gap: 9 (ref 5–15)
BUN: 36 mg/dL — ABNORMAL HIGH (ref 8–23)
CO2: 24 mmol/L (ref 22–32)
Calcium: 8.6 mg/dL — ABNORMAL LOW (ref 8.9–10.3)
Chloride: 104 mmol/L (ref 98–111)
Creatinine, Ser: 1.48 mg/dL — ABNORMAL HIGH (ref 0.61–1.24)
GFR, Estimated: 48 mL/min — ABNORMAL LOW (ref >60)
Glucose, Bld: 115 mg/dL — ABNORMAL HIGH (ref 70–99)
Potassium: 3.9 mmol/L (ref 3.5–5.1)
Sodium: 137 mmol/L (ref 135–145)
Total Bilirubin: 0.8 mg/dL (ref 0.3–1.2)
Total Protein: 6.8 g/dL (ref 6.5–8.1)

## 2021-06-09 MED ORDER — SODIUM CHLORIDE 0.9% FLUSH
10.0000 mL | INTRAVENOUS | Status: DC | PRN
  Filled 2021-06-09: qty 10

## 2021-06-09 MED ORDER — SODIUM CHLORIDE 0.9 % IV SOLN
Freq: Once | INTRAVENOUS | Status: AC
  Filled 2021-06-09: qty 250

## 2021-06-09 MED ORDER — HEPARIN SOD (PORK) LOCK FLUSH 100 UNIT/ML IV SOLN
500.0000 [IU] | Freq: Once | INTRAVENOUS | Status: AC | PRN
  Administered 2021-06-09: 500 [IU]
  Filled 2021-06-09: qty 5

## 2021-06-09 MED ORDER — SULFAMETHOXAZOLE-TRIMETHOPRIM 800-160 MG PO TABS: 1.0000 | ORAL_TABLET | Freq: Two times a day (BID) | ORAL | 0 refills | Status: DC

## 2021-06-09 MED ORDER — PROCHLORPERAZINE MALEATE 10 MG PO TABS: 10.0000 mg | ORAL_TABLET | Freq: Once | ORAL | Status: DC

## 2021-06-09 MED ORDER — SODIUM CHLORIDE 0.9 % IV SOLN
200.0000 mg | Freq: Once | INTRAVENOUS | Status: AC
  Administered 2021-06-09: 200 mg via INTRAVENOUS
  Filled 2021-06-09: qty 8

## 2021-06-09 NOTE — Assessment & Plan Note (Addendum)
#   Lung cancer-non-small cell favor adeno ca; Stage IV; MARCH 24th, 2022- stable; change in the posterior right perihilar lung.  No evidence of any tumor recurrence noted. STABLE.   # proceed with Keytruda maintenance Labs today reviewed;  acceptable for treatment today. Will plan CT SCAN [NON-contrast ]scans today.  Discussed with the patient.  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  Understands that noncontrast CT scan might not evaluate the renal lesion well.  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  # Elevated Blood glucose- 128 PBF; awaiting- Hb1aC. Will monitor for now.  # Tinea dermatitis-left axilla/groin--improved on topical anti-fungal  # folliculitis  [less likley bug bites; ? Salmon; improving; however if not better or worse]- reocmmend bactrim DS BID x5 days.   # DISPOSITION: # Keytruda today # follow up in 3 weeks MD; labs- cbc/cmp;  Keytruda; CT chest prior-;Dr.B

## 2021-06-09 NOTE — Patient Instructions (Signed)
[facility]  Discharge Instructions: Thank you for choosing [facility] to provide your oncology and hematology care.  If you have a lab appointment with the [facility], please go directly to the [facility] and check in at the registration area.  Wear comfortable clothing and clothing appropriate for easy access to any Portacath or PICC line.   We strive to give you quality time with your provider. You may need to reschedule your appointment if you arrive late (15 or more minutes).  Arriving late affects you and other patients whose appointments are after yours.  Also, if you miss three or more appointments without notifying the office, you may be dismissed from the clinic at the provider's discretion.      For prescription refill requests, have your pharmacy contact our office and allow 72 hours for refills to be completed.    Today you received the following chemotherapy and/or immunotherapy agents: Keytruda     To help prevent nausea and vomiting after your treatment, we encourage you to take your nausea medication as directed.  BELOW ARE SYMPTOMS THAT SHOULD BE REPORTED IMMEDIATELY: *FEVER GREATER THAN 100.4 F (38 C) OR HIGHER *CHILLS OR SWEATING *NAUSEA AND VOMITING THAT IS NOT CONTROLLED WITH YOUR NAUSEA MEDICATION *UNUSUAL SHORTNESS OF BREATH *UNUSUAL BRUISING OR BLEEDING *URINARY PROBLEMS (pain or burning when urinating, or frequent urination) *BOWEL PROBLEMS (unusual diarrhea, constipation, pain near the anus) TENDERNESS IN MOUTH AND THROAT WITH OR WITHOUT PRESENCE OF ULCERS (sore throat, sores in mouth, or a toothache) UNUSUAL RASH, SWELLING OR PAIN  UNUSUAL VAGINAL DISCHARGE OR ITCHING   Items with * indicate a potential emergency and should be followed up as soon as possible or go to the Emergency Department if any problems should occur.  Please show the CHEMOTHERAPY ALERT CARD or IMMUNOTHERAPY ALERT CARD at check-in to  the Emergency Department and triage nurse.  Should you have questions after your visit or need to cancel or reschedule your appointment, please contact [facility]  [phone] and follow the prompts.  Office hours are 8:00 a.m. to 4:30 p.m. Monday - Friday. Please note that voicemails left after 4:00 p.m. may not be returned until the following business day.  We are closed weekends and major holidays. You have access to a nurse at all times for urgent questions. Please call the main number to the clinic [phone] and follow the prompts.  For any non-urgent questions, you may also contact your provider using MyChart. We now offer e-Visits for anyone 60 and older to request care online for non-urgent symptoms. For details visit [email or web address].   Also download the MyChart app! Go to the app store, search "MyChart", open the app, select , and log in with your MyChart username and password.  Due to Covid, a mask is required upon entering the hospital/clinic. If you do not have a mask, one will be given to you upon arrival. For doctor visits, patients may have 1 support person aged 92 or older with them. For treatment visits, patients cannot have anyone with them due to current Covid guidelines and our immunocompromised population.

## 2021-06-15 ENCOUNTER — Encounter: Payer: Self-pay | Admitting: Internal Medicine

## 2021-06-15 NOTE — Progress Notes (Signed)
[facility] NOTE  Patient Care Team: Ezequiel Kayser, MD as PCP - General (Internal Medicine) Telford Nab, RN as Oncology Nurse Navigator  CHIEF COMPLAINTS/PURPOSE OF CONSULTATION: lung cancer  #  Oncology History Overview Note  # July 2021- RLL- 9.4 cm x 7.5 cm x 6.7 cm lobulated low-attenuation heterogeneous lung mass within the posteromedial aspect of the right lower lobe, at the level of the right hilum; positive for hilar; mediastinal adenopathy; PET scan-July 2021 + for adrenal metastases; CT-guided biopsy-non-small cell favor adenocarcinoma; necrosis  # carbo-alimtac  [name]; NGS pending] cycle #2 carbo Alimta [place]; 10/18/2020-maintenance Keytruda  # 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 CKD  # NGS/MOLECULAR TESTS: TPS =70%; rest **  # PALLIATIVE CARE EVALUATION:     DIAGNOSIS: Lung cancer  STAGE: 4       ;  GOALS: Palliative  CURRENT/MOST RECENT THERAPY : Carbo Alimta Keytruda    Cancer of lower lobe of right lung ([facility])  07/05/2020 Initial Diagnosis   Cancer of lower lobe of right lung ([facility])   07/26/2020 -  Chemotherapy    Patient is on Treatment Plan: LUNG CARBOPLATIN / PEMETREXED / PEMBROLIZUMAB Q21D INDUCTION X 4 CYCLES / MAINTENANCE PEMETREXED + PEMBROLIZUMAB         HISTORY OF PRESENTING ILLNESS:  Ralph Williams 80 y.o.  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  Patient denies any nausea vomiting abdominal pain.  No new shortness of breath or cough.  Notes to have a rash on his torso.  Question bug bites versus rash s/p eating salmon.   Review of Systems  Constitutional:  Negative for chills, diaphoresis, fever, malaise/fatigue and weight loss.  HENT:  Negative for nosebleeds and sore throat.   Eyes:  Negative for double vision.  Respiratory:  Negative for  hemoptysis, sputum production and shortness of breath.   Cardiovascular:  Negative for chest pain, palpitations, orthopnea and leg swelling.  Gastrointestinal:  Negative for abdominal pain, blood in stool, constipation, diarrhea, heartburn, melena, nausea and vomiting.  Genitourinary:  Negative for dysuria, frequency and urgency.  Musculoskeletal:  Negative for back pain and joint pain.  Skin:  Positive for itching and rash.  Neurological:  Negative for dizziness, tingling, focal weakness, weakness and headaches.  Endo/Heme/Allergies:  Does not bruise/bleed easily.  Psychiatric/Behavioral:  Negative for depression. The patient is not nervous/anxious.     MEDICAL HISTORY:  Past Medical History:  Diagnosis Date   Chronic renal insufficiency    Diabetes mellitus without complication ([facility])    Pt states it's diet controlled.   GERD (gastroesophageal reflux disease)    Hyperlipidemia associated with type 2 diabetes mellitus ([facility])    Hypertension    Lung mass    Non-small cell lung cancer, right ([facility]) 06/2020    SURGICAL HISTORY: Past Surgical History:  Procedure Laterality Date   IR IMAGING GUIDED PORT INSERTION  07/22/2020   lymphoma removal     back    SOCIAL HISTORY: Social History   Socioeconomic History   Marital status: Married    Spouse name: Not on file   Number of children: Not on file   Years of education: Not on file   Highest education level: Not on file  Occupational History   Not on file  Tobacco Use   Smoking status: Former    Pack years:  0.00    Types: Cigarettes    Quit date: 07/23/2007    Years since quitting: 13.9   Smokeless tobacco: Never  Vaping Use   Vaping Use: Never used  Substance and Sexual Activity   Alcohol use: Not Currently    Comment: none for 5 months   Drug use: Never   Sexual activity: Not on file  Other Topics Concern   Not on file  Social History Narrative   Moved from [place]; lives in [place] in 2021; daughter lives next  door. Quit smoking- 13 years ago. Social alcohol. 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    Social Determinants of Health   Financial Resource Strain: Not on file  Food Insecurity: Not on file  Transportation Needs: Not on file  Physical Activity: Not on file  Stress: Not on file  Social Connections: Not on file  Intimate Partner Violence: Not on file    FAMILY HISTORY: Family History  Problem Relation Age of Onset   Liver cancer Sister    Cancer - Other Brother        bone cancer [2001]    ALLERGIES:  is allergic to niacin and related.  MEDICATIONS:  Current Outpatient Medications  Medication Sig Dispense Refill   acetaminophen (TYLENOL) 500 MG tablet Take 2 tablets by mouth every 8 (eight) hours as needed.     aspirin 325 MG EC tablet Take 1 tablet by mouth daily.     atenolol (TENORMIN) 25 MG tablet Take 1 tablet by mouth daily.     atorvastatin (LIPITOR) 40 MG tablet Take 1 tablet by mouth daily.     hydrochlorothiazide (HYDRODIURIL) 12.5 MG tablet Take 12.5 mg by mouth daily.     lidocaine-prilocaine (EMLA) cream Apply 1 application topically as needed. 30 g 4   losartan (COZAAR) 50 MG tablet Take 1 tablet by mouth daily.     Melatonin 10 MG TABS Take 1 tablet by mouth at bedtime.     Omega-3 1000 MG CAPS Take 1,000 mg by mouth in the morning and at bedtime.     omeprazole (PRILOSEC) 20 MG capsule Take 20 mg by mouth in the morning and at bedtime.     sulfamethoxazole-trimethoprim (BACTRIM DS) 800-160 MG tablet Take 1 tablet by mouth 2 (two) times daily. Start ONLY if the rash is worse. Inform MD if plan to start the medictaion 10 tablet 0   tadalafil (CIALIS) 20 MG tablet Take 1 tablet by mouth daily.     ondansetron (ZOFRAN) 8 MG tablet One pill every 8 hours as needed for nausea/vomitting. (Patient not taking: Reported on 06/09/2021) 40 tablet 1   prochlorperazine (COMPAZINE) 10 MG tablet Take 1 tablet (10 mg total) by mouth every 6 (six) hours as  needed for nausea or vomiting. (Patient not taking: Reported on 06/09/2021) 40 tablet 1   No current facility-administered medications for this visit.      Marland Kitchen  PHYSICAL EXAMINATION: ECOG PERFORMANCE STATUS: 1 - Symptomatic but completely ambulatory  Vitals:   06/09/21 0929  BP: 125/61  Pulse: (!) 55  Resp: 18  Temp: (!) 96.7 F (35.9 C)  SpO2: 98%   Filed Weights   06/09/21 0929  Weight: 241 lb 12.8 oz (109.7 kg)    Physical Exam Constitutional:      Comments: Ambulating independently.  Accompanied by his wife.   HENT:     Head: Normocephalic and atraumatic.     Mouth/Throat:     Pharynx: No oropharyngeal exudate.  Eyes:     Pupils: Pupils are equal, round, and reactive to light.  Cardiovascular:     Rate and Rhythm: Normal rate and regular rhythm.  Pulmonary:     Effort: Pulmonary effort is normal. No respiratory distress.     Breath sounds: Normal breath sounds. No wheezing.  Abdominal:     General: Bowel sounds are normal. There is no distension.     Palpations: Abdomen is soft. There is no mass.     Tenderness: There is no abdominal tenderness. There is no guarding or rebound.  Musculoskeletal:        General: No tenderness. Normal range of motion.     Cervical back: Normal range of motion and neck supple.  Skin:    General: Skin is warm.     Comments: Folliculitis like lesions noted torso/abdomen..   Neurological:     Mental Status: He is alert and oriented to person, place, and time.  Psychiatric:        Mood and Affect: Affect normal.     LABORATORY DATA:  I have reviewed the data as listed Lab Results  Component Value Date   WBC 5.2 06/09/2021   HGB 12.3 (L) 06/09/2021   HCT 36.5 (L) 06/09/2021   MCV 91.3 06/09/2021   PLT 169 06/09/2021   Recent Labs    08/27/20 1127 09/06/20 0806 09/20/20 0846 09/27/20 0821 04/28/21 0842 05/20/21 0836 06/09/21 0918  NA 134* 139 144   < > 138 139 137  K 4.4 4.7 3.5   < > 3.9 3.7 3.9  CL 99 106 109   <  > 107 108 104  CO2 27 22 25    < > 24 22 24   GLUCOSE 115* 210* 164*   < > 228* 122* 115*  BUN 36* 31* 24*   < > 27* 34* 36*  CREATININE 1.30* 1.37* 1.29*   < > 1.45* 1.40* 1.48*  CALCIUM 8.1* 8.5* 8.4*   < > 8.6* 8.7* 8.6*  GFRNONAA 53* 49* 53*   < > 49* 51* 48*  GFRAA >60 57* >60  --   --   --   --   PROT  --  6.5 6.4*   < > 6.6 6.7 6.8  ALBUMIN  --  3.2* 3.2*   < > 3.5 3.5 3.7  AST  --  39 72*   < > 21 24 23   ALT  --  117* 106*   < > 20 25 22   ALKPHOS  --  67 68   < > 62 76 74  BILITOT  --  0.8 0.8   < > 0.7 0.8 0.8   < > = values in this interval not displayed.    RADIOGRAPHIC STUDIES: I have personally reviewed the radiological images as listed and agreed with the findings in the report. No results found.  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  No evidence of any tumor recurrence noted. STABLE.   # proceed with Keytruda maintenance Labs today reviewed;  acceptable for treatment today. Will plan CT SCAN [NON-contrast ]scans today.  Discussed with the patient.  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  Understands that noncontrast CT scan might not evaluate the renal lesion well.  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  # Elevated Blood glucose- 128 PBF; awaiting- Hb1aC. Will monitor for now.  # Tinea dermatitis-left axilla/groin--improved on topical anti-fungal  # folliculitis  [less likley bug bites; ? [place]; improving; however if not better or worse]- reocmmend bactrim DS BID x5 days.   # DISPOSITION: # Keytruda today # follow up in 3 weeks MD; labs- cbc/cmp;  Keytruda; CT chest prior-;Dr.B    All questions were answered. The patient knows to call the clinic with any problems, questions or concerns.    Cammie Sickle,  MD 06/15/2021 9:51 PM

## 2021-06-20 ENCOUNTER — Other Ambulatory Visit: Payer: Self-pay

## 2021-06-20 ENCOUNTER — Ambulatory Visit
Admission: RE | Admit: 2021-06-20 | Discharge: 2021-06-20 | Disposition: A | Discharge status: Home / Self Care | Payer: Medicare PPO | Source: Ambulatory Visit | Attending: Internal Medicine | Admitting: Internal Medicine

## 2021-06-20 DIAGNOSIS — C3431 Malignant neoplasm of lower lobe, right bronchus or lung: Secondary | ICD-10-CM | POA: Insufficient documentation

## 2021-06-30 ENCOUNTER — Inpatient Hospital Stay (HOSPITAL_BASED_OUTPATIENT_CLINIC_OR_DEPARTMENT_OTHER): Payer: Medicare PPO | Admitting: Internal Medicine

## 2021-06-30 ENCOUNTER — Other Ambulatory Visit: Payer: Self-pay

## 2021-06-30 ENCOUNTER — Inpatient Hospital Stay: Payer: Medicare PPO | Attending: Internal Medicine

## 2021-06-30 ENCOUNTER — Inpatient Hospital Stay: Payer: Medicare PPO

## 2021-06-30 ENCOUNTER — Encounter: Payer: Self-pay | Admitting: Internal Medicine

## 2021-06-30 DIAGNOSIS — C771 Secondary and unspecified malignant neoplasm of intrathoracic lymph nodes: Secondary | ICD-10-CM | POA: Insufficient documentation

## 2021-06-30 DIAGNOSIS — E1165 Type 2 diabetes mellitus with hyperglycemia: Secondary | ICD-10-CM | POA: Insufficient documentation

## 2021-06-30 DIAGNOSIS — C7971 Secondary malignant neoplasm of right adrenal gland: Secondary | ICD-10-CM | POA: Insufficient documentation

## 2021-06-30 DIAGNOSIS — M899 Disorder of bone, unspecified: Secondary | ICD-10-CM | POA: Diagnosis not present

## 2021-06-30 DIAGNOSIS — I129 Hypertensive chronic kidney disease with stage 1 through stage 4 chronic kidney disease, or unspecified chronic kidney disease: Secondary | ICD-10-CM | POA: Insufficient documentation

## 2021-06-30 DIAGNOSIS — C3431 Malignant neoplasm of lower lobe, right bronchus or lung: Secondary | ICD-10-CM

## 2021-06-30 DIAGNOSIS — N183 Chronic kidney disease, stage 3 unspecified: Secondary | ICD-10-CM | POA: Diagnosis not present

## 2021-06-30 DIAGNOSIS — K219 Gastro-esophageal reflux disease without esophagitis: Secondary | ICD-10-CM | POA: Diagnosis not present

## 2021-06-30 DIAGNOSIS — Z7982 Long term (current) use of aspirin: Secondary | ICD-10-CM | POA: Insufficient documentation

## 2021-06-30 DIAGNOSIS — E785 Hyperlipidemia, unspecified: Secondary | ICD-10-CM | POA: Diagnosis not present

## 2021-06-30 DIAGNOSIS — C7972 Secondary malignant neoplasm of left adrenal gland: Secondary | ICD-10-CM | POA: Diagnosis not present

## 2021-06-30 DIAGNOSIS — Z5112 Encounter for antineoplastic immunotherapy: Secondary | ICD-10-CM | POA: Diagnosis present

## 2021-06-30 DIAGNOSIS — B359 Dermatophytosis, unspecified: Secondary | ICD-10-CM | POA: Diagnosis not present

## 2021-06-30 DIAGNOSIS — Z79899 Other long term (current) drug therapy: Secondary | ICD-10-CM | POA: Diagnosis not present

## 2021-06-30 DIAGNOSIS — E1122 Type 2 diabetes mellitus with diabetic chronic kidney disease: Secondary | ICD-10-CM | POA: Insufficient documentation

## 2021-06-30 LAB — COMPREHENSIVE METABOLIC PANEL
ALT: 22 U/L (ref 0–44)
AST: 23 U/L (ref 15–41)
Albumin: 3.8 g/dL (ref 3.5–5.0)
Alkaline Phosphatase: 72 U/L (ref 38–126)
Anion gap: 4 — ABNORMAL LOW (ref 5–15)
BUN: 29 mg/dL — ABNORMAL HIGH (ref 8–23)
CO2: 22 mmol/L (ref 22–32)
Calcium: 8.5 mg/dL — ABNORMAL LOW (ref 8.9–10.3)
Chloride: 109 mmol/L (ref 98–111)
Creatinine, Ser: 1.39 mg/dL — ABNORMAL HIGH (ref 0.61–1.24)
GFR, Estimated: 52 mL/min — ABNORMAL LOW (ref >60)
Glucose, Bld: 129 mg/dL — ABNORMAL HIGH (ref 70–99)
Potassium: 4.1 mmol/L (ref 3.5–5.1)
Sodium: 135 mmol/L (ref 135–145)
Total Bilirubin: 0.6 mg/dL (ref 0.3–1.2)
Total Protein: 6.9 g/dL (ref 6.5–8.1)

## 2021-06-30 LAB — CBC WITH DIFFERENTIAL/PLATELET
Abs Immature Granulocytes: 0.01 10*3/uL (ref 0.00–0.07)
Basophils Absolute: 0 10*3/uL (ref 0.0–0.1)
Basophils Relative: 0 %
Eosinophils Absolute: 0.1 10*3/uL (ref 0.0–0.5)
Eosinophils Relative: 2 %
HCT: 36.9 % — ABNORMAL LOW (ref 39.0–52.0)
Hemoglobin: 12.3 g/dL — ABNORMAL LOW (ref 13.0–17.0)
Immature Granulocytes: 0 %
Lymphocytes Relative: 45 %
Lymphs Abs: 2.1 10*3/uL (ref 0.7–4.0)
MCH: 30.3 pg (ref 26.0–34.0)
MCHC: 33.3 g/dL (ref 30.0–36.0)
MCV: 90.9 fL (ref 80.0–100.0)
Monocytes Absolute: 0.4 10*3/uL (ref 0.1–1.0)
Monocytes Relative: 9 %
Neutro Abs: 2.1 10*3/uL (ref 1.7–7.7)
Neutrophils Relative %: 44 %
Platelets: 165 10*3/uL (ref 150–400)
RBC: 4.06 MIL/uL — ABNORMAL LOW (ref 4.22–5.81)
RDW: 13 % (ref 11.5–15.5)
WBC: 4.7 10*3/uL (ref 4.0–10.5)
nRBC: 0 % (ref 0.0–0.2)

## 2021-06-30 MED ORDER — SODIUM CHLORIDE 0.9% FLUSH
10.0000 mL | INTRAVENOUS | Status: DC | PRN
  Administered 2021-06-30: 10 mL via INTRAVENOUS
  Filled 2021-06-30: qty 10

## 2021-06-30 MED ORDER — HEPARIN SOD (PORK) LOCK FLUSH 100 UNIT/ML IV SOLN
500.0000 [IU] | Freq: Once | INTRAVENOUS | Status: DC | PRN
  Filled 2021-06-30: qty 5

## 2021-06-30 MED ORDER — HEPARIN SOD (PORK) LOCK FLUSH 100 UNIT/ML IV SOLN
500.0000 [IU] | Freq: Once | INTRAVENOUS | Status: AC
  Administered 2021-06-30: 500 [IU] via INTRAVENOUS
  Filled 2021-06-30: qty 5

## 2021-06-30 MED ORDER — SODIUM CHLORIDE 0.9 % IV SOLN
200.0000 mg | Freq: Once | INTRAVENOUS | Status: AC
  Administered 2021-06-30: 200 mg via INTRAVENOUS
  Filled 2021-06-30: qty 8

## 2021-06-30 MED ORDER — SODIUM CHLORIDE 0.9 % IV SOLN
Freq: Once | INTRAVENOUS | Status: AC
  Filled 2021-06-30: qty 250

## 2021-06-30 MED ORDER — PROCHLORPERAZINE MALEATE 10 MG PO TABS
10.0000 mg | ORAL_TABLET | Freq: Once | ORAL | Status: DC
  Filled 2021-06-30: qty 1

## 2021-06-30 MED ORDER — HEPARIN SOD (PORK) LOCK FLUSH 100 UNIT/ML IV SOLN
INTRAVENOUS | Status: AC
  Filled 2021-06-30: qty 5

## 2021-06-30 NOTE — Patient Instructions (Signed)
[facility]  Discharge Instructions: Thank you for choosing [facility] to provide your oncology and hematology care.  If you have a lab appointment with the [facility], please go directly to the [facility] and check in at the registration area.  Wear comfortable clothing and clothing appropriate for easy access to any Portacath or PICC line.   We strive to give you quality time with your provider. You may need to reschedule your appointment if you arrive late (15 or more minutes).  Arriving late affects you and other patients whose appointments are after yours.  Also, if you miss three or more appointments without notifying the office, you may be dismissed from the clinic at the provider's discretion.      For prescription refill requests, have your pharmacy contact our office and allow 72 hours for refills to be completed.    Today you received the following chemotherapy and/or immunotherapy agents: Keytruda      To help prevent nausea and vomiting after your treatment, we encourage you to take your nausea medication as directed.  BELOW ARE SYMPTOMS THAT SHOULD BE REPORTED IMMEDIATELY: *FEVER GREATER THAN 100.4 F (38 C) OR HIGHER *CHILLS OR SWEATING *NAUSEA AND VOMITING THAT IS NOT CONTROLLED WITH YOUR NAUSEA MEDICATION *UNUSUAL SHORTNESS OF BREATH *UNUSUAL BRUISING OR BLEEDING *URINARY PROBLEMS (pain or burning when urinating, or frequent urination) *BOWEL PROBLEMS (unusual diarrhea, constipation, pain near the anus) TENDERNESS IN MOUTH AND THROAT WITH OR WITHOUT PRESENCE OF ULCERS (sore throat, sores in mouth, or a toothache) UNUSUAL RASH, SWELLING OR PAIN  UNUSUAL VAGINAL DISCHARGE OR ITCHING   Items with * indicate a potential emergency and should be followed up as soon as possible or go to the Emergency Department if any problems should occur.  Please show the CHEMOTHERAPY ALERT CARD or IMMUNOTHERAPY ALERT CARD at check-in  to the Emergency Department and triage nurse.  Should you have questions after your visit or need to cancel or reschedule your appointment, please contact [facility]  [phone] and follow the prompts.  Office hours are 8:00 a.m. to 4:30 p.m. Monday - Friday. Please note that voicemails left after 4:00 p.m. may not be returned until the following business day.  We are closed weekends and major holidays. You have access to a nurse at all times for urgent questions. Please call the main number to the clinic [phone] and follow the prompts.  For any non-urgent questions, you may also contact your provider using MyChart. We now offer e-Visits for anyone 1 and older to request care online for non-urgent symptoms. For details visit [email or web address].   Also download the MyChart app! Go to the app store, search "MyChart", open the app, select [facility], and log in with your MyChart username and password.  Due to Covid, a mask is required upon entering the hospital/clinic. If you do not have a mask, one will be given to you upon arrival. For doctor visits, patients may have 1 support person aged 56 or older with them. For treatment visits, patients cannot have anyone with them due to current Covid guidelines and our immunocompromised population. Pembrolizumab injection What is this medication? PEMBROLIZUMAB (pem broe liz ue mab) is a monoclonal antibody. It is used totreat certain types of cancer. This medicine may be used for other purposes; ask your health care provider orpharmacist if you have questions. COMMON BRAND NAME(S): Keytruda What should I tell my care team before I take this  medication? They need to know if you have any of these conditions: autoimmune diseases like Crohn's disease, ulcerative colitis, or lupus have had or planning to have an allogeneic stem cell transplant (uses someone else's stem cells) history of organ transplant history  of chest radiation nervous system problems like myasthenia gravis or Guillain-Barre syndrome an unusual or allergic reaction to pembrolizumab, other medicines, foods, dyes, or preservatives pregnant or trying to get pregnant breast-feeding How should I use this medication? This medicine is for infusion into a vein. It is given by a health careprofessional in a hospital or clinic setting. A special MedGuide will be given to you before each treatment. Be sure to readthis information carefully each time. Talk to your pediatrician regarding the use of this medicine in children. While this drug may be prescribed for children as young as 6 months for selectedconditions, precautions do apply. Overdosage: If you think you have taken too much of this medicine contact apoison control center or emergency room at once. NOTE: This medicine is only for you. Do not share this medicine with others. What if I miss a dose? It is important not to miss your dose. Call your doctor or health careprofessional if you are unable to keep an appointment. What may interact with this medication? Interactions have not been studied. This list may not describe all possible interactions. Give your health care provider a list of all the medicines, herbs, non-prescription drugs, or dietary supplements you use. Also tell them if you smoke, drink alcohol, or use illegaldrugs. Some items may interact with your medicine. What should I watch for while using this medication? Your condition will be monitored carefully while you are receiving thismedicine. You may need blood work done while you are taking this medicine. Do not become pregnant while taking this medicine or for 4 months after stopping it. Women should inform their doctor if they wish to become pregnant or think they might be pregnant. There is a potential for serious side effects to an unborn child. Talk to your health care professional or pharmacist for more information. Do  not breast-feed an infant while taking this medicine orfor 4 months after the last dose. What side effects may I notice from receiving this medication? Side effects that you should report to your doctor or health care professionalas soon as possible: allergic reactions like skin rash, itching or hives, swelling of the face, lips, or tongue bloody or black, tarry breathing problems changes in vision chest pain chills confusion constipation cough diarrhea dizziness or feeling faint or lightheaded fast or irregular heartbeat fever flushing joint pain low blood counts - this medicine may decrease the number of white blood cells, red blood cells and platelets. You may be at increased risk for infections and bleeding. muscle pain muscle weakness pain, tingling, numbness in the hands or feet persistent headache redness, blistering, peeling or loosening of the skin, including inside the mouth signs and symptoms of high blood sugar such as dizziness; dry mouth; dry skin; fruity breath; nausea; stomach pain; increased hunger or thirst; increased urination signs and symptoms of kidney injury like trouble passing urine or change in the amount of urine signs and symptoms of liver injury like dark urine, light-colored stools, loss of appetite, nausea, right upper belly pain, yellowing of the eyes or skin sweating swollen lymph nodes weight loss Side effects that usually do not require medical attention (report to yourdoctor or health care professional if they continue or are bothersome): decreased appetite hair loss tiredness  This list may not describe all possible side effects. Call your doctor for medical advice about side effects. You may report side effects to FDA at1-800-FDA-1088. Where should I keep my medication? This drug is given in a hospital or clinic and will not be stored at home. NOTE: This sheet is a summary. It may not cover all possible information. If you have questions about  this medicine, talk to your doctor, pharmacist, orhealth care provider.  2022 Elsevier/Gold Standard (2019-11-08 21:44:53)

## 2021-06-30 NOTE — Progress Notes (Signed)
[place] NOTE  Patient Care Team: Ezequiel Kayser, MD as PCP - General (Internal Medicine) Telford Nab, RN as Oncology Nurse Navigator  CHIEF COMPLAINTS/PURPOSE OF CONSULTATION: lung cancer  #  Oncology History Overview Note  # July 2021- RLL- 9.4 cm x 7.5 cm x 6.7 cm lobulated low-attenuation heterogeneous lung mass within the posteromedial aspect of the right lower lobe, at the level of the right hilum; positive for hilar; mediastinal adenopathy; PET scan-July 2021 + for adrenal metastases; CT-guided biopsy-non-small cell favor adenocarcinoma; necrosis  # carbo-alimtac  [name]; NGS pending] cycle #2 carbo Alimta [place]; 10/18/2020-maintenance Keytruda  # 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 CKD  # NGS/MOLECULAR TESTS: TPS =70%; rest **  # PALLIATIVE CARE EVALUATION:     DIAGNOSIS: Lung cancer  STAGE: 4       ;  GOALS: Palliative  CURRENT/MOST RECENT THERAPY : Carbo Alimta Keytruda    Cancer of lower lobe of right lung ([place])  07/05/2020 Initial Diagnosis   Cancer of lower lobe of right lung ([place])   07/26/2020 -  Chemotherapy    Patient is on Treatment Plan: LUNG CARBOPLATIN / PEMETREXED / PEMBROLIZUMAB Q21D INDUCTION X 4 CYCLES / MAINTENANCE PEMETREXED + PEMBROLIZUMAB         HISTORY OF PRESENTING ILLNESS:  Ralph Williams 80 y.o.  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  Patient denies any headaches.  No nausea vomiting.  Denies any further rash.  No new shortness of the cough.  Review of Systems  Constitutional:  Negative for chills, diaphoresis, fever, malaise/fatigue and weight loss.  HENT:  Negative for nosebleeds and sore throat.   Eyes:  Negative for double vision.  Respiratory:  Negative for hemoptysis, sputum production and shortness of  breath.   Cardiovascular:  Negative for chest pain, palpitations, orthopnea and leg swelling.  Gastrointestinal:  Negative for abdominal pain, blood in stool, constipation, diarrhea, heartburn, melena, nausea and vomiting.  Genitourinary:  Negative for dysuria, frequency and urgency.  Musculoskeletal:  Negative for back pain and joint pain.  Neurological:  Negative for dizziness, tingling, focal weakness, weakness and headaches.  Endo/Heme/Allergies:  Does not bruise/bleed easily.  Psychiatric/Behavioral:  Negative for depression. The patient is not nervous/anxious.     MEDICAL HISTORY:  Past Medical History:  Diagnosis Date   Chronic renal insufficiency    Diabetes mellitus without complication ([place])    Pt states it's diet controlled.   GERD (gastroesophageal reflux disease)    Hyperlipidemia associated with type 2 diabetes mellitus (HCC)    Hypertension    Lung mass    Non-small cell lung cancer, right ([place]) 06/2020    SURGICAL HISTORY: Past Surgical History:  Procedure Laterality Date   IR IMAGING GUIDED PORT INSERTION  07/22/2020   lymphoma removal     back    SOCIAL HISTORY: Social History   Socioeconomic History   Marital status: Married    Spouse name: Not on file   Number of children: Not on file   Years of education: Not on file   Highest education level: Not on file  Occupational History   Not on file  Tobacco Use   Smoking status: Former    Pack years: 0.00    Types: Cigarettes    Quit date: 07/23/2007    Years since quitting:  13.9   Smokeless tobacco: Never  Vaping Use   Vaping Use: Never used  Substance and Sexual Activity   Alcohol use: Not Currently    Comment: none for 5 months   Drug use: Never   Sexual activity: Not on file  Other Topics Concern   Not on file  Social History Narrative   Moved from [place]; lives in [place] in 2021; daughter lives next door. Quit smoking- 13 years ago. Social alcohol. Retd. Teacher/electronic community  college; from [place].    Social Determinants of Health   Financial Resource Strain: Not on file  Food Insecurity: Not on file  Transportation Needs: Not on file  Physical Activity: Not on file  Stress: Not on file  Social Connections: Not on file  Intimate Partner Violence: Not on file    FAMILY HISTORY: Family History  Problem Relation Age of Onset   Liver cancer Sister    Cancer - Other Brother        bone cancer [2001]    ALLERGIES:  is allergic to niacin and related.  MEDICATIONS:  Current Outpatient Medications  Medication Sig Dispense Refill   acetaminophen (TYLENOL) 500 MG tablet Take 2 tablets by mouth every 8 (eight) hours as needed.     aspirin 325 MG EC tablet Take 1 tablet by mouth daily.     atenolol (TENORMIN) 25 MG tablet Take 1 tablet by mouth daily.     atorvastatin (LIPITOR) 40 MG tablet Take 1 tablet by mouth daily.     hydrochlorothiazide (HYDRODIURIL) 12.5 MG tablet Take 12.5 mg by mouth daily.     lidocaine-prilocaine (EMLA) cream Apply 1 application topically as needed. 30 g 4   losartan (COZAAR) 50 MG tablet Take 1 tablet by mouth daily.     Melatonin 10 MG TABS Take 1 tablet by mouth at bedtime.     Omega-3 1000 MG CAPS Take 1,000 mg by mouth in the morning and at bedtime.     omeprazole (PRILOSEC) 20 MG capsule Take 20 mg by mouth in the morning and at bedtime.     sulfamethoxazole-trimethoprim (BACTRIM DS) 800-160 MG tablet Take 1 tablet by mouth 2 (two) times daily. Start ONLY if the rash is worse. Inform MD if plan to start the medictaion 10 tablet 0   tadalafil (CIALIS) 20 MG tablet Take 1 tablet by mouth daily.     ondansetron (ZOFRAN) 8 MG tablet One pill every 8 hours as needed for nausea/vomitting. (Patient not taking: No sig reported) 40 tablet 1   prochlorperazine (COMPAZINE) 10 MG tablet Take 1 tablet (10 mg total) by mouth every 6 (six) hours as needed for nausea or vomiting. (Patient not taking: No sig reported) 40 tablet 1   No  current facility-administered medications for this visit.   Facility-Administered Medications Ordered in Other Visits  Medication Dose Route Frequency Provider Last Rate Last Admin   heparin lock flush 100 unit/mL  500 Units Intravenous Once Charlaine Dalton R, MD       heparin lock flush 100 unit/mL  500 Units Intracatheter Once PRN Cammie Sickle, MD       pembrolizumab [facility]) 200 mg in sodium chloride 0.9 % 50 mL chemo infusion  200 mg Intravenous Once Cammie Sickle, MD 116 mL/hr at 06/30/21 1036 200 mg at 06/30/21 1036   prochlorperazine (COMPAZINE) tablet 10 mg  10 mg Oral Once Charlaine Dalton R, MD       sodium chloride flush (NS) 0.9 % injection  10 mL  10 mL Intravenous PRN Cammie Sickle, MD   10 mL at 06/30/21 0916      .  PHYSICAL EXAMINATION: ECOG PERFORMANCE STATUS: 1 - Symptomatic but completely ambulatory  Vitals:   06/30/21 0918  BP: 121/63  Pulse: (!) 53  Resp: 18  Temp: (!) 96.5 F (35.8 C)  SpO2: 98%   Filed Weights   06/30/21 0918  Weight: 240 lb 12.8 oz (109.2 kg)    Physical Exam Constitutional:      Comments: Ambulating independently.  Accompanied by his wife.   HENT:     Head: Normocephalic and atraumatic.     Mouth/Throat:     Pharynx: No oropharyngeal exudate.  Eyes:     Pupils: Pupils are equal, round, and reactive to light.  Cardiovascular:     Rate and Rhythm: Normal rate and regular rhythm.  Pulmonary:     Effort: Pulmonary effort is normal. No respiratory distress.     Breath sounds: Normal breath sounds. No wheezing.  Abdominal:     General: Bowel sounds are normal. There is no distension.     Palpations: Abdomen is soft. There is no mass.     Tenderness: There is no abdominal tenderness. There is no guarding or rebound.  Musculoskeletal:        General: No tenderness. Normal range of motion.     Cervical back: Normal range of motion and neck supple.  Skin:    General: Skin is warm.  Neurological:      Mental Status: He is alert and oriented to person, place, and time.  Psychiatric:        Mood and Affect: Affect normal.     LABORATORY DATA:  I have reviewed the data as listed Lab Results  Component Value Date   WBC 4.7 06/30/2021   HGB 12.3 (L) 06/30/2021   HCT 36.9 (L) 06/30/2021   MCV 90.9 06/30/2021   PLT 165 06/30/2021   Recent Labs    08/27/20 1127 09/06/20 0806 09/20/20 0846 09/27/20 0821 05/20/21 0836 06/09/21 0918 06/30/21 0906  NA 134* 139 144   < > 139 137 135  K 4.4 4.7 3.5   < > 3.7 3.9 4.1  CL 99 106 109   < > 108 104 109  CO2 27 22 25    < > 22 24 22   GLUCOSE 115* 210* 164*   < > 122* 115* 129*  BUN 36* 31* 24*   < > 34* 36* 29*  CREATININE 1.30* 1.37* 1.29*   < > 1.40* 1.48* 1.39*  CALCIUM 8.1* 8.5* 8.4*   < > 8.7* 8.6* 8.5*  GFRNONAA 53* 49* 53*   < > 51* 48* 52*  GFRAA >60 57* >60  --   --   --   --   PROT  --  6.5 6.4*   < > 6.7 6.8 6.9  ALBUMIN  --  3.2* 3.2*   < > 3.5 3.7 3.8  AST  --  39 72*   < > 24 23 23   ALT  --  117* 106*   < > 25 22 22   ALKPHOS  --  67 68   < > 76 74 72  BILITOT  --  0.8 0.8   < > 0.8 0.8 0.6   < > = values in this interval not displayed.    RADIOGRAPHIC STUDIES: I have personally reviewed the radiological images as listed and agreed with the findings in the report. CT  Chest Wo Contrast  Result Date: 06/22/2021 CLINICAL DATA:  Non-small cell lung cancer metastasis. Assess response treatment EXAM: CT CHEST WITHOUT CONTRAST TECHNIQUE: Multidetector CT imaging of the chest was performed following the standard protocol without IV contrast. COMPARISON:  03/11/2021 FINDINGS: Cardiovascular: Coronary artery calcification and aortic atherosclerotic calcification. Mediastinum/Nodes: Port in the anterior chest wall with tip in distal SVC. No axillary or supraclavicular adenopathy. No mediastinal or hilar adenopathy. No pericardial fluid. Esophagus normal. Lungs/Pleura: Angular perihilar consolidation in the superior aspect of the  RIGHT lower lobe not changed from comparison exam. No new nodularity. Branching nodule in the LEFT upper lobe measuring 6 mm (image 52/3) is unchanged. Upper Abdomen: Limited view of the liver, kidneys, pancreas are unremarkable. Nodularity upper pole of the LEFT kidney as described on MRI 01/22/2021. Normal adrenal glands. Musculoskeletal: No aggressive osseous lesion. IMPRESSION: 1. Stable post radiation change in the perihilar RIGHT lower lobe. 2. No evidence of lung cancer recurrence or progression. 3. Stable linear nodular thickening in the LEFT upper lobe. Electronically Signed   By: Suzy Bouchard M.D.   On: 06/22/2021 12:24    ASSESSMENT & PLAN:   Cancer of lower lobe of right lung ([place]) # Lung cancer-non-small cell favor adeno ca; Stage IV; July 2022- Stable post treatment change in the perihilar RIGHT lower lobe; No evidence of lung cancer recurrence or progression; Stable linear nodular thickening in the LEFT upper lobe.  # proceed with Keytruda maintenance Labs today reviewed;  acceptable for treatment today.   Discussed with the patient.  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  # Elevated Blood glucose- 128 PBF; awaiting- Hb1aC. Will monitor for now.  Discussed regarding weight loss/dietary changes.  # Tinea dermatitis-left axilla/groin--improved on topical anti-fungal  # DISPOSITION: # Keytruda today # follow up in 3 weeks MD; labs- cbc/cmp;  Keytruda;-;Dr.B  # I reviewed the blood work- with the patient in detail; also reviewed the imaging independently [as summarized above]; and with the patient in detail.       All questions were answered. The patient knows to call the clinic with any problems, questions or concerns.    Cammie Sickle, MD 06/30/2021 10:56 AM

## 2021-06-30 NOTE — Assessment & Plan Note (Addendum)
#   Lung cancer-non-small cell favor adeno ca; Stage IV; July 2022- Stable post treatment change in the perihilar RIGHT lower lobe; No evidence of lung cancer recurrence or progression; Stable linear nodular thickening in the LEFT upper lobe.  # proceed with Keytruda maintenance Labs today reviewed;  acceptable for treatment today.   Discussed with the patient.  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  # Elevated Blood glucose- 128 PBF; awaiting- Hb1aC. Will monitor for now.  Discussed regarding weight loss/dietary changes.  # Tinea dermatitis-left axilla/groin--improved on topical anti-fungal  # DISPOSITION: # Keytruda today # follow up in 3 weeks MD; labs- cbc/cmp;  Keytruda;-;Dr.B  # I reviewed the blood work- with the patient in detail; also reviewed the imaging independently [as summarized above]; and with the patient in detail.

## 2021-07-21 ENCOUNTER — Other Ambulatory Visit: Payer: Self-pay

## 2021-07-21 ENCOUNTER — Inpatient Hospital Stay: Payer: Medicare PPO

## 2021-07-21 ENCOUNTER — Inpatient Hospital Stay: Payer: Medicare PPO | Attending: Internal Medicine

## 2021-07-21 ENCOUNTER — Inpatient Hospital Stay (HOSPITAL_BASED_OUTPATIENT_CLINIC_OR_DEPARTMENT_OTHER): Payer: Medicare PPO | Admitting: Internal Medicine

## 2021-07-21 ENCOUNTER — Encounter: Payer: Self-pay | Admitting: Internal Medicine

## 2021-07-21 DIAGNOSIS — C3431 Malignant neoplasm of lower lobe, right bronchus or lung: Secondary | ICD-10-CM

## 2021-07-21 DIAGNOSIS — E785 Hyperlipidemia, unspecified: Secondary | ICD-10-CM | POA: Diagnosis not present

## 2021-07-21 DIAGNOSIS — I129 Hypertensive chronic kidney disease with stage 1 through stage 4 chronic kidney disease, or unspecified chronic kidney disease: Secondary | ICD-10-CM | POA: Insufficient documentation

## 2021-07-21 DIAGNOSIS — K219 Gastro-esophageal reflux disease without esophagitis: Secondary | ICD-10-CM | POA: Diagnosis not present

## 2021-07-21 DIAGNOSIS — E1122 Type 2 diabetes mellitus with diabetic chronic kidney disease: Secondary | ICD-10-CM | POA: Insufficient documentation

## 2021-07-21 DIAGNOSIS — Z87891 Personal history of nicotine dependence: Secondary | ICD-10-CM | POA: Insufficient documentation

## 2021-07-21 DIAGNOSIS — Z79899 Other long term (current) drug therapy: Secondary | ICD-10-CM | POA: Diagnosis not present

## 2021-07-21 DIAGNOSIS — Z5112 Encounter for antineoplastic immunotherapy: Secondary | ICD-10-CM | POA: Insufficient documentation

## 2021-07-21 DIAGNOSIS — C7972 Secondary malignant neoplasm of left adrenal gland: Secondary | ICD-10-CM | POA: Diagnosis not present

## 2021-07-21 DIAGNOSIS — B359 Dermatophytosis, unspecified: Secondary | ICD-10-CM | POA: Insufficient documentation

## 2021-07-21 DIAGNOSIS — N183 Chronic kidney disease, stage 3 unspecified: Secondary | ICD-10-CM | POA: Diagnosis not present

## 2021-07-21 DIAGNOSIS — Z7984 Long term (current) use of oral hypoglycemic drugs: Secondary | ICD-10-CM | POA: Diagnosis not present

## 2021-07-21 DIAGNOSIS — Z7982 Long term (current) use of aspirin: Secondary | ICD-10-CM | POA: Diagnosis not present

## 2021-07-21 DIAGNOSIS — C7971 Secondary malignant neoplasm of right adrenal gland: Secondary | ICD-10-CM | POA: Insufficient documentation

## 2021-07-21 LAB — CBC WITH DIFFERENTIAL/PLATELET
Abs Immature Granulocytes: 0.01 10*3/uL (ref 0.00–0.07)
Basophils Absolute: 0 10*3/uL (ref 0.0–0.1)
Basophils Relative: 0 %
Eosinophils Absolute: 0.1 10*3/uL (ref 0.0–0.5)
Eosinophils Relative: 1 %
HCT: 36.5 % — ABNORMAL LOW (ref 39.0–52.0)
Hemoglobin: 12 g/dL — ABNORMAL LOW (ref 13.0–17.0)
Immature Granulocytes: 0 %
Lymphocytes Relative: 38 %
Lymphs Abs: 1.7 10*3/uL (ref 0.7–4.0)
MCH: 29.9 pg (ref 26.0–34.0)
MCHC: 32.9 g/dL (ref 30.0–36.0)
MCV: 91 fL (ref 80.0–100.0)
Monocytes Absolute: 0.4 10*3/uL (ref 0.1–1.0)
Monocytes Relative: 9 %
Neutro Abs: 2.2 10*3/uL (ref 1.7–7.7)
Neutrophils Relative %: 52 %
Platelets: 162 10*3/uL (ref 150–400)
RBC: 4.01 MIL/uL — ABNORMAL LOW (ref 4.22–5.81)
RDW: 13.2 % (ref 11.5–15.5)
WBC: 4.3 10*3/uL (ref 4.0–10.5)
nRBC: 0 % (ref 0.0–0.2)

## 2021-07-21 LAB — COMPREHENSIVE METABOLIC PANEL
ALT: 21 U/L (ref 0–44)
AST: 21 U/L (ref 15–41)
Albumin: 3.8 g/dL (ref 3.5–5.0)
Alkaline Phosphatase: 70 U/L (ref 38–126)
Anion gap: 9 (ref 5–15)
BUN: 33 mg/dL — ABNORMAL HIGH (ref 8–23)
CO2: 23 mmol/L (ref 22–32)
Calcium: 8.5 mg/dL — ABNORMAL LOW (ref 8.9–10.3)
Chloride: 105 mmol/L (ref 98–111)
Creatinine, Ser: 1.53 mg/dL — ABNORMAL HIGH (ref 0.61–1.24)
GFR, Estimated: 46 mL/min — ABNORMAL LOW (ref >60)
Glucose, Bld: 111 mg/dL — ABNORMAL HIGH (ref 70–99)
Potassium: 3.9 mmol/L (ref 3.5–5.1)
Sodium: 137 mmol/L (ref 135–145)
Total Bilirubin: 0.7 mg/dL (ref 0.3–1.2)
Total Protein: 7 g/dL (ref 6.5–8.1)

## 2021-07-21 LAB — TSH: TSH: 0.815 u[IU]/mL (ref 0.350–4.500)

## 2021-07-21 MED ORDER — SODIUM CHLORIDE 0.9 % IV SOLN
200.0000 mg | Freq: Once | INTRAVENOUS | Status: AC
  Administered 2021-07-21: 200 mg via INTRAVENOUS
  Filled 2021-07-21: qty 8

## 2021-07-21 MED ORDER — PROCHLORPERAZINE MALEATE 10 MG PO TABS: 10.0000 mg | ORAL_TABLET | Freq: Once | ORAL | Status: DC

## 2021-07-21 MED ORDER — HEPARIN SOD (PORK) LOCK FLUSH 100 UNIT/ML IV SOLN
INTRAVENOUS | Status: AC
  Filled 2021-07-21: qty 5

## 2021-07-21 MED ORDER — HEPARIN SOD (PORK) LOCK FLUSH 100 UNIT/ML IV SOLN
500.0000 [IU] | Freq: Once | INTRAVENOUS | Status: DC | PRN
  Filled 2021-07-21: qty 5

## 2021-07-21 MED ORDER — SODIUM CHLORIDE 0.9% FLUSH
10.0000 mL | Freq: Once | INTRAVENOUS | Status: AC
Start: 2021-07-21 — End: 2021-07-21
  Administered 2021-07-21: 10 mL via INTRAVENOUS
  Filled 2021-07-21: qty 10

## 2021-07-21 MED ORDER — HEPARIN SOD (PORK) LOCK FLUSH 100 UNIT/ML IV SOLN
500.0000 [IU] | Freq: Once | INTRAVENOUS | Status: AC
  Administered 2021-07-21: 500 [IU] via INTRAVENOUS
  Filled 2021-07-21: qty 5

## 2021-07-21 MED ORDER — SODIUM CHLORIDE 0.9 % IV SOLN
Freq: Once | INTRAVENOUS | Status: DC
  Filled 2021-07-21: qty 250

## 2021-07-21 MED ORDER — SODIUM CHLORIDE 0.9 % IV SOLN
Freq: Once | INTRAVENOUS | Status: AC
  Filled 2021-07-21: qty 250

## 2021-07-21 NOTE — Progress Notes (Signed)
[place] NOTE  Patient Care Team: Ezequiel Kayser, MD (Inactive) as PCP - General (Internal Medicine) Telford Nab, RN as Oncology Nurse Navigator  CHIEF COMPLAINTS/PURPOSE OF CONSULTATION: lung cancer  #  Oncology History Overview Note  # July 2021- RLL- 9.4 cm x 7.5 cm x 6.7 cm lobulated low-attenuation heterogeneous lung mass within the posteromedial aspect of the right lower lobe, at the level of the right hilum; positive for hilar; mediastinal adenopathy; PET scan-July 2021 + for adrenal metastases; CT-guided biopsy-non-small cell favor adenocarcinoma; necrosis  # carbo-alimtac  [name]; NGS pending] cycle #2 carbo Alimta [place]; 10/18/2020-maintenance Keytruda  # 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 CKD  # NGS/MOLECULAR TESTS: TPS =70%; rest **  # PALLIATIVE CARE EVALUATION:     DIAGNOSIS: Lung cancer  STAGE: 4       ;  GOALS: Palliative  CURRENT/MOST RECENT THERAPY : Carbo Alimta Keytruda    Cancer of lower lobe of right lung ([place])  07/05/2020 Initial Diagnosis   Cancer of lower lobe of right lung ([place])   07/26/2020 -  Chemotherapy    Patient is on Treatment Plan: LUNG CARBOPLATIN / PEMETREXED / PEMBROLIZUMAB Q21D INDUCTION X 4 CYCLES / MAINTENANCE PEMETREXED + PEMBROLIZUMAB         HISTORY OF PRESENTING ILLNESS:  Ralph Williams 80 y.o.  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  Patient denies any worsening shortness of breath or cough.  Denies any rash.  No headaches.  He has made some dietary changes cutting down carbs/had intentional weight loss of about 3-4 pounds.   Review of Systems  Constitutional:  Negative for chills, diaphoresis, fever, malaise/fatigue and weight loss.  HENT:  Negative for nosebleeds and sore throat.   Eyes:  Negative for double vision.   Respiratory:  Negative for hemoptysis, sputum production and shortness of breath.   Cardiovascular:  Negative for chest pain, palpitations, orthopnea and leg swelling.  Gastrointestinal:  Negative for abdominal pain, blood in stool, constipation, diarrhea, heartburn, melena, nausea and vomiting.  Genitourinary:  Negative for dysuria, frequency and urgency.  Musculoskeletal:  Negative for back pain and joint pain.  Neurological:  Negative for dizziness, tingling, focal weakness, weakness and headaches.  Endo/Heme/Allergies:  Does not bruise/bleed easily.  Psychiatric/Behavioral:  Negative for depression. The patient is not nervous/anxious.     MEDICAL HISTORY:  Past Medical History:  Diagnosis Date   Chronic renal insufficiency    Diabetes mellitus without complication ([place])    Pt states it's diet controlled.   GERD (gastroesophageal reflux disease)    Hyperlipidemia associated with type 2 diabetes mellitus ([facility])    Hypertension    Lung mass    Non-small cell lung cancer, right ([place]) 06/2020    SURGICAL HISTORY: Past Surgical History:  Procedure Laterality Date   IR IMAGING GUIDED PORT INSERTION  07/22/2020   lymphoma removal     back    SOCIAL HISTORY: Social History   Socioeconomic History   Marital status: Married    Spouse name: Not on file   Number of children: Not on file   Years of education: Not on file   Highest education level: Not on file  Occupational History   Not on file  Tobacco Use   Smoking status: Former    Types: Cigarettes    Quit date: 07/23/2007  Years since quitting: 14.0   Smokeless tobacco: Never  Vaping Use   Vaping Use: Never used  Substance and Sexual Activity   Alcohol use: Not Currently    Comment: none for 5 months   Drug use: Never   Sexual activity: Not on file  Other Topics Concern   Not on file  Social History Narrative   Moved from [place]; lives in [place] in 2021; daughter lives next door. Quit smoking- 13 years ago.  Social alcohol. 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    Social Determinants of Health   Financial Resource Strain: Not on file  Food Insecurity: Not on file  Transportation Needs: Not on file  Physical Activity: Not on file  Stress: Not on file  Social Connections: Not on file  Intimate Partner Violence: Not on file    FAMILY HISTORY: Family History  Problem Relation Age of Onset   Liver cancer Sister    Cancer - Other Brother        bone cancer [2001]    ALLERGIES:  is allergic to niacin and related.  MEDICATIONS:  Current Outpatient Medications  Medication Sig Dispense Refill   acetaminophen (TYLENOL) 500 MG tablet Take 2 tablets by mouth every 8 (eight) hours as needed.     aspirin 325 MG EC tablet Take 1 tablet by mouth daily.     atenolol (TENORMIN) 25 MG tablet Take 1 tablet by mouth daily.     atorvastatin (LIPITOR) 40 MG tablet Take 1 tablet by mouth daily.     hydrochlorothiazide (HYDRODIURIL) 12.5 MG tablet Take 12.5 mg by mouth daily.     lidocaine-prilocaine (EMLA) cream Apply 1 application topically as needed. 30 g 4   losartan (COZAAR) 50 MG tablet Take 1 tablet by mouth daily.     Melatonin 10 MG TABS Take 1 tablet by mouth at bedtime.     Omega-3 1000 MG CAPS Take 1,000 mg by mouth in the morning and at bedtime.     omeprazole (PRILOSEC) 20 MG capsule Take 20 mg by mouth in the morning and at bedtime.     tadalafil (CIALIS) 20 MG tablet Take 1 tablet by mouth daily.     ondansetron (ZOFRAN) 8 MG tablet One pill every 8 hours as needed for nausea/vomitting. (Patient not taking: No sig reported) 40 tablet 1   prochlorperazine (COMPAZINE) 10 MG tablet Take 1 tablet (10 mg total) by mouth every 6 (six) hours as needed for nausea or vomiting. (Patient not taking: No sig reported) 40 tablet 1   sulfamethoxazole-trimethoprim (BACTRIM DS) 800-160 MG tablet Take 1 tablet by mouth 2 (two) times daily. Start ONLY if the rash is worse. Inform MD if  plan to start the medictaion (Patient not taking: Reported on 07/21/2021) 10 tablet 0   No current facility-administered medications for this visit.   Facility-Administered Medications Ordered in Other Visits  Medication Dose Route Frequency Provider Last Rate Last Admin   0.9 %  sodium chloride infusion   Intravenous Once Charlaine Dalton R, MD       heparin lock flush 100 unit/mL  500 Units Intravenous Once Charlaine Dalton R, MD       heparin lock flush 100 unit/mL  500 Units Intracatheter Once PRN Cammie Sickle, MD       pembrolizumab [facility]) 200 mg in sodium chloride 0.9 % 50 mL chemo infusion  200 mg Intravenous Once Cammie Sickle, MD       prochlorperazine (COMPAZINE) tablet 10  mg  10 mg Oral Once Cammie Sickle, MD          .  PHYSICAL EXAMINATION: ECOG PERFORMANCE STATUS: 1 - Symptomatic but completely ambulatory  Vitals:   07/21/21 0907  BP: 127/69  Pulse: 62  Resp: 16  Temp: (!) 96.7 F (35.9 C)  SpO2: 98%   Filed Weights   07/21/21 0907 07/21/21 0911  Weight: 237 lb (107.5 kg) 237 lb 6.4 oz (107.7 kg)    Physical Exam Constitutional:      Comments: Ambulating independently.  Accompanied by his wife.   HENT:     Head: Normocephalic and atraumatic.     Mouth/Throat:     Pharynx: No oropharyngeal exudate.  Eyes:     Pupils: Pupils are equal, round, and reactive to light.  Cardiovascular:     Rate and Rhythm: Normal rate and regular rhythm.  Pulmonary:     Effort: Pulmonary effort is normal. No respiratory distress.     Breath sounds: Normal breath sounds. No wheezing.  Abdominal:     General: Bowel sounds are normal. There is no distension.     Palpations: Abdomen is soft. There is no mass.     Tenderness: There is no abdominal tenderness. There is no guarding or rebound.  Musculoskeletal:        General: No tenderness. Normal range of motion.     Cervical back: Normal range of motion and neck supple.  Skin:    General:  Skin is warm.  Neurological:     Mental Status: He is alert and oriented to person, place, and time.  Psychiatric:        Mood and Affect: Affect normal.     LABORATORY DATA:  I have reviewed the data as listed Lab Results  Component Value Date   WBC 4.3 07/21/2021   HGB 12.0 (L) 07/21/2021   HCT 36.5 (L) 07/21/2021   MCV 91.0 07/21/2021   PLT 162 07/21/2021   Recent Labs    08/27/20 1127 09/06/20 0806 09/20/20 0846 09/27/20 0821 06/09/21 0918 06/30/21 0906 07/21/21 0855  NA 134* 139 144   < > 137 135 137  K 4.4 4.7 3.5   < > 3.9 4.1 3.9  CL 99 106 109   < > 104 109 105  CO2 27 22 25    < > 24 22 23   GLUCOSE 115* 210* 164*   < > 115* 129* 111*  BUN 36* 31* 24*   < > 36* 29* 33*  CREATININE 1.30* 1.37* 1.29*   < > 1.48* 1.39* 1.53*  CALCIUM 8.1* 8.5* 8.4*   < > 8.6* 8.5* 8.5*  GFRNONAA 53* 49* 53*   < > 48* 52* 46*  GFRAA >60 57* >60  --   --   --   --   PROT  --  6.5 6.4*   < > 6.8 6.9 7.0  ALBUMIN  --  3.2* 3.2*   < > 3.7 3.8 3.8  AST  --  39 72*   < > 23 23 21   ALT  --  117* 106*   < > 22 22 21   ALKPHOS  --  67 68   < > 74 72 70  BILITOT  --  0.8 0.8   < > 0.8 0.6 0.7   < > = values in this interval not displayed.    RADIOGRAPHIC STUDIES: I have personally reviewed the radiological images as listed and agreed with the findings in the report.  No results found.  ASSESSMENT & PLAN:   Cancer of lower lobe of right lung ([place]) # Lung cancer-non-small cell favor adeno ca; Stage IV; July 2022- Stable post treatment change in the perihilar RIGHT lower lobe; No evidence of lung cancer recurrence or progression;  linear nodular thickening in the LEFT upper lobe. Stable  # proceed with Keytruda maintenance Labs today reviewed;  acceptable for treatment today.   Discussed with the patient.  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  #  Elevated Blood glucose- 111-Improved/ STABLE; MAY 2022-Hb1a C-6.5.  Will monitor for now.  Again reviewed/congratulated regarding weight loss/dietary changes.  # Tinea dermatitis-left axilla/groin--improved on topical anti-fungal - resolved.   # DISPOSITION: # Keytruda today # follow up in 3 weeks MD; labs- cbc/cmp;  Keytruda;;Dr.B    All questions were answered. The patient knows to call the clinic with any problems, questions or concerns.    Cammie Sickle, MD 07/21/2021 10:06 AM

## 2021-07-21 NOTE — Assessment & Plan Note (Addendum)
#   Lung cancer-non-small cell favor adeno ca; Stage IV; July 2022- Stable post treatment change in the perihilar RIGHT lower lobe; No evidence of lung cancer recurrence or progression;  linear nodular thickening in the LEFT upper lobe. Stable  # proceed with Keytruda maintenance Labs today reviewed;  acceptable for treatment today.   Discussed with the patient.  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  # Elevated Blood glucose- 111-Improved/ STABLE; MAY 2022-Hb1a C-6.5.  Will monitor for now.  Again reviewed/congratulated regarding weight loss/dietary changes.  # Tinea dermatitis-left axilla/groin--improved on topical anti-fungal - resolved.   # DISPOSITION: # Keytruda today # follow up in 3 weeks MD; labs- cbc/cmp;  Keytruda;;Dr.B

## 2021-07-21 NOTE — Progress Notes (Signed)
Per Dr. Rogue Bussing, ok to treat today with a creatinine of 1.53.

## 2021-07-21 NOTE — Patient Instructions (Signed)
[facility]  Discharge Instructions: Thank you for choosing [facility] to provide your oncology and hematology care.  If you have a lab appointment with the [facility], please go directly to the [facility] and check in at the registration area.  Wear comfortable clothing and clothing appropriate for easy access to any Portacath or PICC line.   We strive to give you quality time with your provider. You may need to reschedule your appointment if you arrive late (15 or more minutes).  Arriving late affects you and other patients whose appointments are after yours.  Also, if you miss three or more appointments without notifying the office, you may be dismissed from the clinic at the provider's discretion.      For prescription refill requests, have your pharmacy contact our office and allow 72 hours for refills to be completed.    Today you received the following chemotherapy and/or immunotherapy agents: Keytruda      To help prevent nausea and vomiting after your treatment, we encourage you to take your nausea medication as directed.  BELOW ARE SYMPTOMS THAT SHOULD BE REPORTED IMMEDIATELY: *FEVER GREATER THAN 100.4 F (38 C) OR HIGHER *CHILLS OR SWEATING *NAUSEA AND VOMITING THAT IS NOT CONTROLLED WITH YOUR NAUSEA MEDICATION *UNUSUAL SHORTNESS OF BREATH *UNUSUAL BRUISING OR BLEEDING *URINARY PROBLEMS (pain or burning when urinating, or frequent urination) *BOWEL PROBLEMS (unusual diarrhea, constipation, pain near the anus) TENDERNESS IN MOUTH AND THROAT WITH OR WITHOUT PRESENCE OF ULCERS (sore throat, sores in mouth, or a toothache) UNUSUAL RASH, SWELLING OR PAIN  UNUSUAL VAGINAL DISCHARGE OR ITCHING   Items with * indicate a potential emergency and should be followed up as soon as possible or go to the Emergency Department if any problems should occur.  Please show the CHEMOTHERAPY ALERT CARD or IMMUNOTHERAPY ALERT CARD at check-in  to the Emergency Department and triage nurse.  Should you have questions after your visit or need to cancel or reschedule your appointment, please contact [facility]  [phone] and follow the prompts.  Office hours are 8:00 a.m. to 4:30 p.m. Monday - Friday. Please note that voicemails left after 4:00 p.m. may not be returned until the following business day.  We are closed weekends and major holidays. You have access to a nurse at all times for urgent questions. Please call the main number to the clinic [phone] and follow the prompts.  For any non-urgent questions, you may also contact your provider using MyChart. We now offer e-Visits for anyone 60 and older to request care online for non-urgent symptoms. For details visit [email or web address].   Also download the MyChart app! Go to the app store, search "MyChart", open the app, select [facility], and log in with your MyChart username and password.  Due to Covid, a mask is required upon entering the hospital/clinic. If you do not have a mask, one will be given to you upon arrival. For doctor visits, patients may have 1 support person aged 78 or older with them. For treatment visits, patients cannot have anyone with them due to current Covid guidelines and our immunocompromised population.

## 2021-08-11 ENCOUNTER — Inpatient Hospital Stay (HOSPITAL_BASED_OUTPATIENT_CLINIC_OR_DEPARTMENT_OTHER): Payer: Medicare PPO | Admitting: Internal Medicine

## 2021-08-11 ENCOUNTER — Other Ambulatory Visit: Payer: Self-pay

## 2021-08-11 ENCOUNTER — Inpatient Hospital Stay: Payer: Medicare PPO

## 2021-08-11 ENCOUNTER — Encounter: Payer: Self-pay | Admitting: Internal Medicine

## 2021-08-11 DIAGNOSIS — C3431 Malignant neoplasm of lower lobe, right bronchus or lung: Secondary | ICD-10-CM | POA: Diagnosis not present

## 2021-08-11 DIAGNOSIS — Z5112 Encounter for antineoplastic immunotherapy: Secondary | ICD-10-CM | POA: Diagnosis not present

## 2021-08-11 LAB — COMPREHENSIVE METABOLIC PANEL
ALT: 23 U/L (ref 0–44)
AST: 20 U/L (ref 15–41)
Albumin: 3.7 g/dL (ref 3.5–5.0)
Alkaline Phosphatase: 75 U/L (ref 38–126)
Anion gap: 6 (ref 5–15)
BUN: 30 mg/dL — ABNORMAL HIGH (ref 8–23)
CO2: 25 mmol/L (ref 22–32)
Calcium: 8.9 mg/dL (ref 8.9–10.3)
Chloride: 106 mmol/L (ref 98–111)
Creatinine, Ser: 1.35 mg/dL — ABNORMAL HIGH (ref 0.61–1.24)
GFR, Estimated: 54 mL/min — ABNORMAL LOW (ref >60)
Glucose, Bld: 124 mg/dL — ABNORMAL HIGH (ref 70–99)
Potassium: 3.9 mmol/L (ref 3.5–5.1)
Sodium: 137 mmol/L (ref 135–145)
Total Bilirubin: 0.8 mg/dL (ref 0.3–1.2)
Total Protein: 6.9 g/dL (ref 6.5–8.1)

## 2021-08-11 LAB — CBC WITH DIFFERENTIAL/PLATELET
Abs Immature Granulocytes: 0.01 10*3/uL (ref 0.00–0.07)
Basophils Absolute: 0 10*3/uL (ref 0.0–0.1)
Basophils Relative: 0 %
Eosinophils Absolute: 0.1 10*3/uL (ref 0.0–0.5)
Eosinophils Relative: 2 %
HCT: 38.3 % — ABNORMAL LOW (ref 39.0–52.0)
Hemoglobin: 12.3 g/dL — ABNORMAL LOW (ref 13.0–17.0)
Immature Granulocytes: 0 %
Lymphocytes Relative: 52 %
Lymphs Abs: 2.5 10*3/uL (ref 0.7–4.0)
MCH: 29.1 pg (ref 26.0–34.0)
MCHC: 32.1 g/dL (ref 30.0–36.0)
MCV: 90.5 fL (ref 80.0–100.0)
Monocytes Absolute: 0.4 10*3/uL (ref 0.1–1.0)
Monocytes Relative: 8 %
Neutro Abs: 1.9 10*3/uL (ref 1.7–7.7)
Neutrophils Relative %: 38 %
Platelets: 172 10*3/uL (ref 150–400)
RBC: 4.23 MIL/uL (ref 4.22–5.81)
RDW: 13.7 % (ref 11.5–15.5)
WBC: 4.9 10*3/uL (ref 4.0–10.5)
nRBC: 0 % (ref 0.0–0.2)

## 2021-08-11 MED ORDER — METFORMIN HCL 500 MG PO TABS: 500.0000 mg | ORAL_TABLET | Freq: Two times a day (BID) | ORAL | 3 refills | Status: DC

## 2021-08-11 MED ORDER — HEPARIN SOD (PORK) LOCK FLUSH 100 UNIT/ML IV SOLN
500.0000 [IU] | Freq: Once | INTRAVENOUS | Status: DC | PRN
  Filled 2021-08-11: qty 5

## 2021-08-11 MED ORDER — SODIUM CHLORIDE 0.9% FLUSH
10.0000 mL | INTRAVENOUS | Status: DC | PRN
  Administered 2021-08-11: 10 mL via INTRAVENOUS
  Filled 2021-08-11: qty 10

## 2021-08-11 MED ORDER — PROCHLORPERAZINE MALEATE 10 MG PO TABS: 10.0000 mg | ORAL_TABLET | Freq: Once | ORAL | Status: DC

## 2021-08-11 MED ORDER — SODIUM CHLORIDE 0.9 % IV SOLN
Freq: Once | INTRAVENOUS | Status: AC
  Filled 2021-08-11: qty 250

## 2021-08-11 MED ORDER — HEPARIN SOD (PORK) LOCK FLUSH 100 UNIT/ML IV SOLN
INTRAVENOUS | Status: AC
  Filled 2021-08-11: qty 5

## 2021-08-11 MED ORDER — HEPARIN SOD (PORK) LOCK FLUSH 100 UNIT/ML IV SOLN
500.0000 [IU] | Freq: Once | INTRAVENOUS | Status: AC
  Administered 2021-08-11: 500 [IU] via INTRAVENOUS
  Filled 2021-08-11: qty 5

## 2021-08-11 MED ORDER — SODIUM CHLORIDE 0.9 % IV SOLN
200.0000 mg | Freq: Once | INTRAVENOUS | Status: AC
  Administered 2021-08-11: 200 mg via INTRAVENOUS
  Filled 2021-08-11: qty 8

## 2021-08-11 NOTE — Patient Instructions (Signed)
[facility] ONCOLOG[facility]  Discharge Instructions: Thank you for choosing [facility] to provide your oncology and hematology care.  If you have a lab appointment with the [facility], please go directly to the [facility] and check in at the registration area.  Wear comfortable clothing and clothing appropriate for easy access to any Portacath or PICC line.   We strive to give you quality time with your provider. You may need to reschedule your appointment if you arrive late (15 or more minutes).  Arriving late affects you and other patients whose appointments are after yours.  Also, if you miss three or more appointments without notifying the office, you may be dismissed from the clinic at the provider's discretion.      For prescription refill requests, have your pharmacy contact our office and allow 72 hours for refills to be completed.    Today you received the following chemotherapy and/or immunotherapy agents Beryle Flock   To help prevent nausea and vomiting after your treatment, we encourage you to take your nausea medication as directed.  BELOW ARE SYMPTOMS THAT SHOULD BE REPORTED IMMEDIATELY: *FEVER GREATER THAN 100.4 F (38 C) OR HIGHER *CHILLS OR SWEATING *NAUSEA AND VOMITING THAT IS NOT CONTROLLED WITH YOUR NAUSEA MEDICATION *UNUSUAL SHORTNESS OF BREATH *UNUSUAL BRUISING OR BLEEDING *URINARY PROBLEMS (pain or burning when urinating, or frequent urination) *BOWEL PROBLEMS (unusual diarrhea, constipation, pain near the anus) TENDERNESS IN MOUTH AND THROAT WITH OR WITHOUT PRESENCE OF ULCERS (sore throat, sores in mouth, or a toothache) UNUSUAL RASH, SWELLING OR PAIN  UNUSUAL VAGINAL DISCHARGE OR ITCHING   Items with * indicate a potential emergency and should be followed up as soon as possible or go to the Emergency Department if any problems should occur.  Please show the CHEMOTHERAPY ALERT CARD or IMMUNOTHERAPY ALERT CARD at check-in to  the Emergency Department and triage nurse.  Should you have questions after your visit or need to cancel or reschedule your appointment, please contact [facility]  [phone] and follow the prompts.  Office hours are 8:00 a.m. to 4:30 p.m. Monday - Friday. Please note that voicemails left after 4:00 p.m. may not be returned until the following business day.  We are closed weekends and major holidays. You have access to a nurse at all times for urgent questions. Please call the main number to the clinic [phone] and follow the prompts.  For any non-urgent questions, you may also contact your provider using MyChart. We now offer e-Visits for anyone 40 and older to request care online for non-urgent symptoms. For details visit [email or web address].   Also download the MyChart app! Go to the app store, search "MyChart", open the app, select [facility], and log in with your MyChart username and password.  Due to Covid, a mask is required upon entering the hospital/clinic. If you do not have a mask, one will be given to you upon arrival. For doctor visits, patients may have 1 support person aged 68 or older with them. For treatment visits, patients cannot have anyone with them due to current Covid guidelines and our immunocompromised population.

## 2021-08-11 NOTE — Progress Notes (Signed)
[facility] NOTE  Patient Care Team: Ezequiel Kayser, MD (Inactive) as PCP - General (Internal Medicine) Telford Nab, RN as Oncology Nurse Navigator  CHIEF COMPLAINTS/PURPOSE OF CONSULTATION: lung cancer  #  Oncology History Overview Note  # July 2021- RLL- 9.4 cm x 7.5 cm x 6.7 cm lobulated low-attenuation heterogeneous lung mass within the posteromedial aspect of the right lower lobe, at the level of the right hilum; positive for hilar; mediastinal adenopathy; PET scan-July 2021 + for adrenal metastases; CT-guided biopsy-non-small cell favor adenocarcinoma; necrosis  # carbo-alimtac  [name]; NGS pending] cycle #2 carbo Alimta [place]; 10/18/2020-maintenance Keytruda  # 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 CKD  # NGS/MOLECULAR TESTS: TPS =70%; rest **  # PALLIATIVE CARE EVALUATION:     DIAGNOSIS: Lung cancer  STAGE: 4       ;  GOALS: Palliative  CURRENT/MOST RECENT THERAPY : Carbo Alimta Keytruda    Cancer of lower lobe of right lung ([facility])  07/05/2020 Initial Diagnosis   Cancer of lower lobe of right lung ([facility])   07/26/2020 -  Chemotherapy    Patient is on Treatment Plan: LUNG CARBOPLATIN / PEMETREXED / PEMBROLIZUMAB Q21D INDUCTION X 4 CYCLES / MAINTENANCE PEMETREXED + PEMBROLIZUMAB         HISTORY OF PRESENTING ILLNESS:  Ralph Williams 80 y.o.  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  No worsening shortness of breath or cough.  Appetite is good.  No weight loss no headaches.  Review of Systems  Constitutional:  Negative for chills, diaphoresis, fever, malaise/fatigue and weight loss.  HENT:  Negative for nosebleeds and sore throat.   Eyes:  Negative for double vision.  Respiratory:  Negative for hemoptysis, sputum production and shortness of breath.   Cardiovascular:  Negative  for chest pain, palpitations, orthopnea and leg swelling.  Gastrointestinal:  Negative for abdominal pain, blood in stool, constipation, diarrhea, heartburn, melena, nausea and vomiting.  Genitourinary:  Negative for dysuria, frequency and urgency.  Musculoskeletal:  Negative for back pain and joint pain.  Neurological:  Negative for dizziness, tingling, focal weakness, weakness and headaches.  Endo/Heme/Allergies:  Does not bruise/bleed easily.  Psychiatric/Behavioral:  Negative for depression. The patient is not nervous/anxious.     MEDICAL HISTORY:  Past Medical History:  Diagnosis Date   Chronic renal insufficiency    Diabetes mellitus without complication ([facility])    Pt states it's diet controlled.   GERD (gastroesophageal reflux disease)    Hyperlipidemia associated with type 2 diabetes mellitus (H[facility])    Hypertension    Lung mass    Non-small cell lung cancer, right ([facility]) 06/2020    SURGICAL HISTORY: Past Surgical History:  Procedure Laterality Date   IR IMAGING GUIDED PORT INSERTION  07/22/2020   lymphoma removal     back    SOCIAL HISTORY: Social History   Socioeconomic History   Marital status: Married    Spouse name: Not on file   Number of children: Not on file   Years of education: Not on file   Highest education level: Not on file  Occupational History   Not on file  Tobacco Use   Smoking status: Former    Types: Cigarettes    Quit date: 07/23/2007    Years since quitting: 14.0   Smokeless tobacco: Never  Vaping Use   Vaping  Use: Never used  Substance and Sexual Activity   Alcohol use: Not Currently    Comment: none for 5 months   Drug use: Never   Sexual activity: Not on file  Other Topics Concern   Not on file  Social History Narrative   Moved from [place]; lives in [place] in 2021; daughter lives next door. Quit smoking- 13 years ago. Social alcohol. 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    Social Determinants of Health    Financial Resource Strain: Not on file  Food Insecurity: Not on file  Transportation Needs: Not on file  Physical Activity: Not on file  Stress: Not on file  Social Connections: Not on file  Intimate Partner Violence: Not on file    FAMILY HISTORY: Family History  Problem Relation Age of Onset   Liver cancer Sister    Cancer - Other Brother        bone cancer [2001]    ALLERGIES:  is allergic to niacin and related.  MEDICATIONS:  Current Outpatient Medications  Medication Sig Dispense Refill   acetaminophen (TYLENOL) 500 MG tablet Take 2 tablets by mouth every 8 (eight) hours as needed.     aspirin 325 MG EC tablet Take 1 tablet by mouth daily.     atenolol (TENORMIN) 25 MG tablet Take 1 tablet by mouth daily.     atorvastatin (LIPITOR) 40 MG tablet Take 1 tablet by mouth daily.     hydrochlorothiazide (HYDRODIURIL) 12.5 MG tablet Take 12.5 mg by mouth daily.     lidocaine-prilocaine (EMLA) cream Apply 1 application topically as needed. 30 g 4   losartan (COZAAR) 50 MG tablet Take 1 tablet by mouth daily.     Melatonin 10 MG TABS Take 1 tablet by mouth at bedtime.     metFORMIN (GLUCOPHAGE) 500 MG tablet Take 1 tablet (500 mg total) by mouth 2 (two) times daily with a meal. 60 tablet 3   Omega-3 1000 MG CAPS Take 1,000 mg by mouth in the morning and at bedtime.     omeprazole (PRILOSEC) 20 MG capsule Take 20 mg by mouth in the morning and at bedtime.     tadalafil (CIALIS) 20 MG tablet Take 1 tablet by mouth daily.     ondansetron (ZOFRAN) 8 MG tablet One pill every 8 hours as needed for nausea/vomitting. (Patient not taking: No sig reported) 40 tablet 1   prochlorperazine (COMPAZINE) 10 MG tablet Take 1 tablet (10 mg total) by mouth every 6 (six) hours as needed for nausea or vomiting. (Patient not taking: No sig reported) 40 tablet 1   No current facility-administered medications for this visit.   Facility-Administered Medications Ordered in Other Visits  Medication Dose  Route Frequency Provider Last Rate Last Admin   heparin lock flush 100 unit/mL  500 Units Intravenous Once Charlaine Dalton R, MD       sodium chloride flush (NS) 0.9 % injection 10 mL  10 mL Intravenous PRN Cammie Sickle, MD   10 mL at 08/11/21 0854      .  PHYSICAL EXAMINATION: ECOG PERFORMANCE STATUS: 1 - Symptomatic but completely ambulatory  Vitals:   08/11/21 0900  BP: 128/72  Pulse: (!) 54  Resp: 18  Temp: (!) 96 F (35.6 C)   There were no vitals filed for this visit.   Physical Exam Constitutional:      Comments: Ambulating independently.  Accompanied by his wife.   HENT:     Head: Normocephalic and  atraumatic.     Mouth/Throat:     Pharynx: No oropharyngeal exudate.  Eyes:     Pupils: Pupils are equal, round, and reactive to light.  Cardiovascular:     Rate and Rhythm: Normal rate and regular rhythm.  Pulmonary:     Effort: Pulmonary effort is normal. No respiratory distress.     Breath sounds: Normal breath sounds. No wheezing.  Abdominal:     General: Bowel sounds are normal. There is no distension.     Palpations: Abdomen is soft. There is no mass.     Tenderness: There is no abdominal tenderness. There is no guarding or rebound.  Musculoskeletal:        General: No tenderness. Normal range of motion.     Cervical back: Normal range of motion and neck supple.  Skin:    General: Skin is warm.  Neurological:     Mental Status: He is alert and oriented to person, place, and time.  Psychiatric:        Mood and Affect: Affect normal.     LABORATORY DATA:  I have reviewed the data as listed Lab Results  Component Value Date   WBC 4.9 08/11/2021   HGB 12.3 (L) 08/11/2021   HCT 38.3 (L) 08/11/2021   MCV 90.5 08/11/2021   PLT 172 08/11/2021   Recent Labs    08/27/20 1127 09/06/20 0806 09/20/20 0846 09/27/20 0821 06/30/21 0906 07/21/21 0855 08/11/21 0854  NA 134* 139 144   < > 135 137 137  K 4.4 4.7 3.5   < > 4.1 3.9 3.9  CL 99  106 109   < > 109 105 106  CO2 27 22 25    < > 22 23 25   GLUCOSE 115* 210* 164*   < > 129* 111* 124*  BUN 36* 31* 24*   < > 29* 33* 30*  CREATININE 1.30* 1.37* 1.29*   < > 1.39* 1.53* 1.35*  CALCIUM 8.1* 8.5* 8.4*   < > 8.5* 8.5* 8.9  GFRNONAA 53* 49* 53*   < > 52* 46* 54*  GFRAA >60 57* >60  --   --   --   --   PROT  --  6.5 6.4*   < > 6.9 7.0 6.9  ALBUMIN  --  3.2* 3.2*   < > 3.8 3.8 3.7  AST  --  39 72*   < > 23 21 20   ALT  --  117* 106*   < > 22 21 23   ALKPHOS  --  67 68   < > 72 70 75  BILITOT  --  0.8 0.8   < > 0.6 0.7 0.8   < > = values in this interval not displayed.    RADIOGRAPHIC STUDIES: I have personally reviewed the radiological images as listed and agreed with the findings in the report. No results found.  ASSESSMENT & PLAN:   Cancer of lower lobe of right lung ([facility]) # Lung cancer-non-small cell favor adeno ca; Stage IV; July 2022- Stable post treatment change in the perihilar RIGHT lower lobe; No evidence of lung cancer recurrence or progression;  linear nodular thickening in the LEFT upper lobe. Stable  # proceed with Keytruda maintenance Labs today reviewed;  acceptable for treatment today.   Discussed with the patient.  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  # Elevated  Blood glucose-FBG- 126 MAY 2022-Hb1a C-6.5.  Start metformin 500 mg BID. Discussed re: diarrhea.   # DISPOSITION: # Keytruda today # follow up in 3 weeks MD; labs- cbc/cmp;  Keytruda-Dr.B    All questions were answered. The patient knows to call the clinic with any problems, questions or concerns.    Cammie Sickle, MD 08/11/2021 10:18 AM

## 2021-08-11 NOTE — Assessment & Plan Note (Signed)
#   Lung cancer-non-small cell favor adeno ca; Stage IV; July 2022- Stable post treatment change in the perihilar RIGHT lower lobe; No evidence of lung cancer recurrence or progression;  linear nodular thickening in the LEFT upper lobe. Stable  # proceed with Keytruda maintenance Labs today reviewed;  acceptable for treatment today.   Discussed with the patient.  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  # Elevated Blood glucose-FBG- 126 MAY 2022-Hb1a C-6.5.  Start metformin 500 mg BID. Discussed re: diarrhea.   # DISPOSITION: # Keytruda today # follow up in 3 weeks MD; labs- cbc/cmp;  Keytruda-Dr.B

## 2021-09-01 ENCOUNTER — Other Ambulatory Visit: Payer: Self-pay

## 2021-09-01 ENCOUNTER — Encounter: Payer: Self-pay | Admitting: Internal Medicine

## 2021-09-01 ENCOUNTER — Inpatient Hospital Stay: Payer: Medicare PPO

## 2021-09-01 ENCOUNTER — Inpatient Hospital Stay (HOSPITAL_BASED_OUTPATIENT_CLINIC_OR_DEPARTMENT_OTHER): Payer: Medicare PPO | Admitting: Internal Medicine

## 2021-09-01 ENCOUNTER — Inpatient Hospital Stay: Payer: Medicare PPO | Attending: Internal Medicine

## 2021-09-01 DIAGNOSIS — Z87891 Personal history of nicotine dependence: Secondary | ICD-10-CM | POA: Insufficient documentation

## 2021-09-01 DIAGNOSIS — C7971 Secondary malignant neoplasm of right adrenal gland: Secondary | ICD-10-CM | POA: Diagnosis not present

## 2021-09-01 DIAGNOSIS — C3431 Malignant neoplasm of lower lobe, right bronchus or lung: Secondary | ICD-10-CM | POA: Insufficient documentation

## 2021-09-01 DIAGNOSIS — K219 Gastro-esophageal reflux disease without esophagitis: Secondary | ICD-10-CM | POA: Diagnosis not present

## 2021-09-01 DIAGNOSIS — Z79899 Other long term (current) drug therapy: Secondary | ICD-10-CM | POA: Insufficient documentation

## 2021-09-01 DIAGNOSIS — N189 Chronic kidney disease, unspecified: Secondary | ICD-10-CM | POA: Insufficient documentation

## 2021-09-01 DIAGNOSIS — I129 Hypertensive chronic kidney disease with stage 1 through stage 4 chronic kidney disease, or unspecified chronic kidney disease: Secondary | ICD-10-CM | POA: Diagnosis not present

## 2021-09-01 DIAGNOSIS — C7972 Secondary malignant neoplasm of left adrenal gland: Secondary | ICD-10-CM | POA: Diagnosis not present

## 2021-09-01 DIAGNOSIS — Z7984 Long term (current) use of oral hypoglycemic drugs: Secondary | ICD-10-CM | POA: Insufficient documentation

## 2021-09-01 DIAGNOSIS — Z5112 Encounter for antineoplastic immunotherapy: Secondary | ICD-10-CM | POA: Insufficient documentation

## 2021-09-01 DIAGNOSIS — E1122 Type 2 diabetes mellitus with diabetic chronic kidney disease: Secondary | ICD-10-CM | POA: Insufficient documentation

## 2021-09-01 LAB — COMPREHENSIVE METABOLIC PANEL
ALT: 23 U/L (ref 0–44)
AST: 22 U/L (ref 15–41)
Albumin: 3.8 g/dL (ref 3.5–5.0)
Alkaline Phosphatase: 75 U/L (ref 38–126)
Anion gap: 7 (ref 5–15)
BUN: 27 mg/dL — ABNORMAL HIGH (ref 8–23)
CO2: 24 mmol/L (ref 22–32)
Calcium: 8.5 mg/dL — ABNORMAL LOW (ref 8.9–10.3)
Chloride: 104 mmol/L (ref 98–111)
Creatinine, Ser: 1.18 mg/dL (ref 0.61–1.24)
GFR, Estimated: >60 mL/min (ref >60)
Glucose, Bld: 151 mg/dL — ABNORMAL HIGH (ref 70–99)
Potassium: 3.7 mmol/L (ref 3.5–5.1)
Sodium: 135 mmol/L (ref 135–145)
Total Bilirubin: 0.8 mg/dL (ref 0.3–1.2)
Total Protein: 6.6 g/dL (ref 6.5–8.1)

## 2021-09-01 LAB — CBC WITH DIFFERENTIAL/PLATELET
Abs Immature Granulocytes: 0.01 10*3/uL (ref 0.00–0.07)
Basophils Absolute: 0 10*3/uL (ref 0.0–0.1)
Basophils Relative: 0 %
Eosinophils Absolute: 0.1 10*3/uL (ref 0.0–0.5)
Eosinophils Relative: 1 %
HCT: 39.1 % (ref 39.0–52.0)
Hemoglobin: 12.7 g/dL — ABNORMAL LOW (ref 13.0–17.0)
Immature Granulocytes: 0 %
Lymphocytes Relative: 44 %
Lymphs Abs: 2.3 10*3/uL (ref 0.7–4.0)
MCH: 29.1 pg (ref 26.0–34.0)
MCHC: 32.5 g/dL (ref 30.0–36.0)
MCV: 89.7 fL (ref 80.0–100.0)
Monocytes Absolute: 0.5 10*3/uL (ref 0.1–1.0)
Monocytes Relative: 9 %
Neutro Abs: 2.4 10*3/uL (ref 1.7–7.7)
Neutrophils Relative %: 46 %
Platelets: 159 10*3/uL (ref 150–400)
RBC: 4.36 MIL/uL (ref 4.22–5.81)
RDW: 14 % (ref 11.5–15.5)
WBC: 5.2 10*3/uL (ref 4.0–10.5)
nRBC: 0 % (ref 0.0–0.2)

## 2021-09-01 MED ORDER — HEPARIN SOD (PORK) LOCK FLUSH 100 UNIT/ML IV SOLN
500.0000 [IU] | Freq: Once | INTRAVENOUS | Status: AC | PRN
  Filled 2021-09-01: qty 5

## 2021-09-01 MED ORDER — SODIUM CHLORIDE 0.9 % IV SOLN
Freq: Once | INTRAVENOUS | Status: AC
Start: 2021-09-01 — End: 2021-09-01
  Filled 2021-09-01: qty 250

## 2021-09-01 MED ORDER — SODIUM CHLORIDE 0.9 % IV SOLN
200.0000 mg | Freq: Once | INTRAVENOUS | Status: AC
  Administered 2021-09-01: 200 mg via INTRAVENOUS
  Filled 2021-09-01: qty 8

## 2021-09-01 MED ORDER — SODIUM CHLORIDE 0.9% FLUSH
10.0000 mL | Freq: Once | INTRAVENOUS | Status: AC
  Administered 2021-09-01: 10 mL via INTRAVENOUS
  Filled 2021-09-01: qty 10

## 2021-09-01 MED ORDER — HEPARIN SOD (PORK) LOCK FLUSH 100 UNIT/ML IV SOLN
INTRAVENOUS | Status: AC
  Administered 2021-09-01: 500 [IU]
  Filled 2021-09-01: qty 5

## 2021-09-01 MED ORDER — PROCHLORPERAZINE MALEATE 10 MG PO TABS
10.0000 mg | ORAL_TABLET | Freq: Once | ORAL | Status: AC
  Filled 2021-09-01: qty 1

## 2021-09-01 NOTE — Progress Notes (Signed)
[facility] [facility] NOTE  Patient Care Team: Ezequiel Kayser, MD (Inactive) as PCP - General (Internal Medicine) Telford Nab, RN as Oncology Nurse Navigator  CHIEF COMPLAINTS/PURPOSE OF CONSULTATION: lung cancer  #  Oncology History Overview Note  # July 2021- RLL- 9.4 cm x 7.5 cm x 6.7 cm lobulated low-attenuation heterogeneous lung mass within the posteromedial aspect of the right lower lobe, at the level of the right hilum; positive for hilar; mediastinal adenopathy; PET scan-July 2021 + for adrenal metastases; CT-guided biopsy-non-small cell favor adenocarcinoma; necrosis  # carbo-alimtac  [name]; NGS pending] cycle #2 carbo Alimta [place]; 10/18/2020-maintenance Keytruda  # 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 CKD  # NGS/MOLECULAR TESTS: TPS =70%; rest **  # PALLIATIVE CARE EVALUATION:     DIAGNOSIS: Lung cancer  STAGE: 4       ;  GOALS: Palliative  CURRENT/MOST RECENT THERAPY : Carbo Alimta Keytruda    Cancer of lower lobe of right lung ([place])  07/05/2020 Initial Diagnosis   Cancer of lower lobe of right lung ([place])   07/26/2020 -  Chemotherapy    Patient is on Treatment Plan: LUNG CARBOPLATIN / PEMETREXED / PEMBROLIZUMAB Q21D INDUCTION X 4 CYCLES / MAINTENANCE PEMETREXED + PEMBROLIZUMAB         HISTORY OF PRESENTING ILLNESS:  Sanjit Wentzel 80 y.o.  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  Interim patient has moved back to [place] is building a house there.   No worsening shortness of breath or cough.  Appetite is good.  No weight loss no headaches.  Review of Systems  Constitutional:  Negative for chills, diaphoresis, fever, malaise/fatigue and weight loss.  HENT:  Negative for nosebleeds and sore throat.   Eyes:  Negative for double vision.  Respiratory:  Negative for  hemoptysis, sputum production and shortness of breath.   Cardiovascular:  Negative for chest pain, palpitations, orthopnea and leg swelling.  Gastrointestinal:  Negative for abdominal pain, blood in stool, constipation, diarrhea, heartburn, melena, nausea and vomiting.  Genitourinary:  Negative for dysuria, frequency and urgency.  Musculoskeletal:  Negative for back pain and joint pain.  Neurological:  Negative for dizziness, tingling, focal weakness, weakness and headaches.  Endo/Heme/Allergies:  Does not bruise/bleed easily.  Psychiatric/Behavioral:  Negative for depression. The patient is not nervous/anxious.     MEDICAL HISTORY:  Past Medical History:  Diagnosis Date   Chronic renal insufficiency    Diabetes mellitus without complication ([place])    Pt states it's diet controlled.   GERD (gastroesophageal reflux disease)    Hyperlipidemia associated with type 2 diabetes mellitus (HCC)    Hypertension    Lung mass    Non-small cell lung cancer, right ([place]) 06/2020    SURGICAL HISTORY: Past Surgical History:  Procedure Laterality Date   IR IMAGING GUIDED PORT INSERTION  07/22/2020   lymphoma removal     back    SOCIAL HISTORY: Social History   Socioeconomic History   Marital status: Married    Spouse name: Not on file   Number of children: Not on file   Years of education: Not on file   Highest education level: Not on file  Occupational History   Not on file  Tobacco Use   Smoking status: Former    Types: Cigarettes    Quit date: 07/23/2007    Years  since quitting: 14.1   Smokeless tobacco: Never  Vaping Use   Vaping Use: Never used  Substance and Sexual Activity   Alcohol use: Not Currently    Comment: none for 5 months   Drug use: Never   Sexual activity: Not on file  Other Topics Concern   Not on file  Social History Narrative   Moved from [place]; lives in [place] in 2021; daughter lives next door. Quit smoking- 13 years ago. Social alcohol. Retd.  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    Social Determinants of Health   Financial Resource Strain: Not on file  Food Insecurity: Not on file  Transportation Needs: Not on file  Physical Activity: Not on file  Stress: Not on file  Social Connections: Not on file  Intimate Partner Violence: Not on file    FAMILY HISTORY: Family History  Problem Relation Age of Onset   Liver cancer Sister    Cancer - Other Brother        bone cancer [2001]    ALLERGIES:  is allergic to niacin and related.  MEDICATIONS:  Current Outpatient Medications  Medication Sig Dispense Refill   acetaminophen (TYLENOL) 500 MG tablet Take 2 tablets by mouth every 8 (eight) hours as needed.     aspirin 325 MG EC tablet Take 1 tablet by mouth daily.     atenolol (TENORMIN) 25 MG tablet Take 1 tablet by mouth daily.     atorvastatin (LIPITOR) 40 MG tablet Take 1 tablet by mouth daily.     hydrochlorothiazide (HYDRODIURIL) 12.5 MG tablet Take 12.5 mg by mouth daily.     lidocaine-prilocaine (EMLA) cream Apply 1 application topically as needed. 30 g 4   losartan (COZAAR) 50 MG tablet Take 1 tablet by mouth daily.     Melatonin 10 MG TABS Take 1 tablet by mouth at bedtime.     metFORMIN (GLUCOPHAGE) 500 MG tablet Take 1 tablet (500 mg total) by mouth 2 (two) times daily with a meal. 60 tablet 3   Omega-3 1000 MG CAPS Take 1,000 mg by mouth in the morning and at bedtime.     omeprazole (PRILOSEC) 20 MG capsule Take 20 mg by mouth in the morning and at bedtime.     tadalafil (CIALIS) 20 MG tablet Take 1 tablet by mouth daily.     ondansetron (ZOFRAN) 8 MG tablet One pill every 8 hours as needed for nausea/vomitting. (Patient not taking: No sig reported) 40 tablet 1   prochlorperazine (COMPAZINE) 10 MG tablet Take 1 tablet (10 mg total) by mouth every 6 (six) hours as needed for nausea or vomiting. (Patient not taking: No sig reported) 40 tablet 1   No current facility-administered medications for  this visit.   Facility-Administered Medications Ordered in Other Visits  Medication Dose Route Frequency Provider Last Rate Last Admin   heparin lock flush 100 unit/mL  500 Units Intracatheter Once PRN Cammie Sickle, MD       prochlorperazine (COMPAZINE) tablet 10 mg  10 mg Oral Once Cammie Sickle, MD          .  PHYSICAL EXAMINATION: ECOG PERFORMANCE STATUS: 1 - Symptomatic but completely ambulatory  Vitals:   09/01/21 0940  BP: 127/63  Pulse: 60  Resp: 18  Temp: 98 F (36.7 C)  SpO2: 98%   Filed Weights   09/01/21 0940  Weight: 236 lb 9.6 oz (107.3 kg)     Physical Exam Constitutional:      Comments:  Ambulating independently.  Accompanied by his wife.   HENT:     Head: Normocephalic and atraumatic.     Mouth/Throat:     Pharynx: No oropharyngeal exudate.  Eyes:     Pupils: Pupils are equal, round, and reactive to light.  Cardiovascular:     Rate and Rhythm: Normal rate and regular rhythm.  Pulmonary:     Effort: Pulmonary effort is normal. No respiratory distress.     Breath sounds: Normal breath sounds. No wheezing.  Abdominal:     General: Bowel sounds are normal. There is no distension.     Palpations: Abdomen is soft. There is no mass.     Tenderness: There is no abdominal tenderness. There is no guarding or rebound.  Musculoskeletal:        General: No tenderness. Normal range of motion.     Cervical back: Normal range of motion and neck supple.  Skin:    General: Skin is warm.  Neurological:     Mental Status: He is alert and oriented to person, place, and time.  Psychiatric:        Mood and Affect: Affect normal.     LABORATORY DATA:  I have reviewed the data as listed Lab Results  Component Value Date   WBC 5.2 09/01/2021   HGB 12.7 (L) 09/01/2021   HCT 39.1 09/01/2021   MCV 89.7 09/01/2021   PLT 159 09/01/2021   Recent Labs    09/06/20 0806 09/20/20 0846 09/27/20 0821 07/21/21 0855 08/11/21 0854 09/01/21 0911  NA  139 144   < > 137 137 135  K 4.7 3.5   < > 3.9 3.9 3.7  CL 106 109   < > 105 106 104  CO2 22 25   < > 23 25 24   GLUCOSE 210* 164*   < > 111* 124* 151*  BUN 31* 24*   < > 33* 30* 27*  CREATININE 1.37* 1.29*   < > 1.53* 1.35* 1.18  CALCIUM 8.5* 8.4*   < > 8.5* 8.9 8.5*  GFRNONAA 49* 53*   < > 46* 54* >60  GFRAA 57* >60  --   --   --   --   PROT 6.5 6.4*   < > 7.0 6.9 6.6  ALBUMIN 3.2* 3.2*   < > 3.8 3.7 3.8  AST 39 72*   < > 21 20 22   ALT 117* 106*   < > 21 23 23   ALKPHOS 67 68   < > 70 75 75  BILITOT 0.8 0.8   < > 0.7 0.8 0.8   < > = values in this interval not displayed.    RADIOGRAPHIC STUDIES: I have personally reviewed the radiological images as listed and agreed with the findings in the report. No results found.  ASSESSMENT & PLAN:   Cancer of lower lobe of right lung ([place]) # Lung cancer-non-small cell favor adeno ca; Stage IV; July 2022- Stable post treatment change in the perihilar RIGHT lower lobe; No evidence of lung cancer recurrence or progression;  linear nodular thickening in the LEFT upper lobe. Stable  # proceed with Keytruda maintenance Labs today reviewed;  acceptable for treatment today.   STABLE.  Discussed option of every 6 weeks Keytruda [since moved to [place]; concern for side effects.  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  # Elevated  Blood glucose-PBG- 151; MAY 2022-Hb1a C-6.5. On Metformin 500 mg BID; monitor now.    # DISPOSITION: later appt in AM # Keytruda today # follow up in 3 weeks MD; labs- cbc/cmp;  Keytruda-Dr.B    All questions were answered. The patient knows to call the clinic with any problems, questions or concerns.    Cammie Sickle, MD 09/02/2021 12:04 AM

## 2021-09-01 NOTE — Progress Notes (Signed)
Patient here for follow up and treatment today. No complaints at this time.

## 2021-09-01 NOTE — Assessment & Plan Note (Addendum)
#   Lung cancer-non-small cell favor adeno ca; Stage IV; July 2022- Stable post treatment change in the perihilar RIGHT lower lobe; No evidence of lung cancer recurrence or progression;  linear nodular thickening in the LEFT upper lobe. Stable  # proceed with Keytruda maintenance Labs today reviewed;  acceptable for treatment today.   STABLE.  Discussed option of every 6 weeks Keytruda [since moved to [place]; concern for side effects.  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  # Elevated Blood glucose-PBG- 151; MAY 2022-Hb1a C-6.5. On Metformin 500 mg BID; monitor now.    # DISPOSITION: later appt in AM # Keytruda today # follow up in 3 weeks MD; labs- cbc/cmp;  Keytruda-Dr.B

## 2021-09-02 ENCOUNTER — Encounter: Payer: Self-pay | Admitting: Internal Medicine

## 2021-09-17 ENCOUNTER — Inpatient Hospital Stay (HOSPITAL_BASED_OUTPATIENT_CLINIC_OR_DEPARTMENT_OTHER): Payer: Medicare PPO | Admitting: Nurse Practitioner

## 2021-09-17 ENCOUNTER — Other Ambulatory Visit: Payer: Self-pay

## 2021-09-17 ENCOUNTER — Telehealth: Payer: Self-pay | Admitting: *Deleted

## 2021-09-17 DIAGNOSIS — U071 COVID-19: Secondary | ICD-10-CM

## 2021-09-17 MED ORDER — NIRMATRELVIR/RITONAVIR (PAXLOVID)TABLET: 3.0000 | ORAL_TABLET | Freq: Two times a day (BID) | ORAL | 0 refills | Status: AC

## 2021-09-17 NOTE — Telephone Encounter (Signed)
Ralph Williams- patient will need urgent apt with you (virtual) to discuss Paxlovid. Are you available?

## 2021-09-17 NOTE — Progress Notes (Signed)
Virtual Visit Progress Note  Symptom Management [facility]  [phone] [phone]  I connected with Ralph Williams on 09/17/21 at  1:00 PM EDT by telephone visit and verified that I am speaking with the correct person using two identifiers.   I discussed the limitations, risks, security and privacy concerns of performing an evaluation and management service by telemedicine and the availability of in-person appointments. I also discussed with the patient that there may be a patient responsible charge related to this service. The patient expressed understanding and agreed to proceed.   Other persons participating in the visit and their role in the encounter: Patient's wife   Patient's location: Home Provider's location: Clinic  Chief Complaint: Covid Infection  Patient Care Team: Ezequiel Kayser, MD (Inactive) as PCP - General (Internal Medicine) Telford Nab, RN as Oncology Nurse Navigator   Name of the patient: Ralph Williams  [id number]  1941/05/10   Date of visit: 09/17/21  Diagnosis- Metastatic Lung Cancer   Heme/Onc history:  Oncology History Overview Note  # July 2021- RLL- 9.4 cm x 7.5 cm x 6.7 cm lobulated low-attenuation heterogeneous lung mass within the posteromedial aspect of the right lower lobe, at the level of the right hilum; positive for hilar; mediastinal adenopathy; PET scan-July 2021 + for adrenal metastases; CT-guided biopsy-non-small cell favor adenocarcinoma; necrosis  # carbo-alimtac  [name]; NGS pending] cycle #2 carbo Alimta Keytruda; 10/18/2020-maintenance Keytruda  # 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 CKD  # NGS/MOLECULAR TESTS: TPS =70%; rest **  # PALLIATIVE CARE EVALUATION:     DIAGNOSIS: Lung cancer  STAGE: 4       ;  GOALS: Palliative  CURRENT/MOST RECENT THERAPY : Carbo Alimta Keytruda    Cancer of  lower lobe of right lung ([place])  07/05/2020 Initial Diagnosis   Cancer of lower lobe of right lung ([place])   07/26/2020 -  Chemotherapy    Patient is on Treatment Plan: LUNG CARBOPLATIN / PEMETREXED / PEMBROLIZUMAB Q21D INDUCTION X 4 CYCLES / MAINTENANCE PEMETREXED + PEMBROLIZUMAB         Interval history- Started having itchy throat 2 days ago, headache. Last night he had 'heavy', body aches. He took a home test which was positive. He has had vaccines and last booster was April 2022. No chest pain or shortness of breath. Feels tired and run down. Mild cough. Drinking normally.   Review of systems- Review of Systems  Constitutional:  Positive for malaise/fatigue. Negative for chills, diaphoresis, fever and weight loss.  HENT:  Negative for congestion, ear discharge, ear pain, nosebleeds, sinus pain and sore throat.   Eyes:  Negative for pain and redness.  Respiratory:  Positive for cough. Negative for hemoptysis, sputum production, shortness of breath, wheezing and stridor.   Cardiovascular:  Negative for chest pain, palpitations and leg swelling.  Gastrointestinal:  Negative for abdominal pain, constipation, diarrhea, nausea and vomiting.  Musculoskeletal:  Positive for myalgias. Negative for falls and joint pain.  Skin:  Negative for rash.  Neurological:  Negative for dizziness, loss of consciousness, weakness and headaches.  Psychiatric/Behavioral:  Negative for depression. The patient is not nervous/anxious and does not have insomnia.   All other systems reviewed and are negative.   Current treatment- Beryle Flock (last on 09/01/21)  Allergies  Allergen Reactions   Niacin And Related Rash    Past Medical History:  Diagnosis Date  Chronic renal insufficiency    Diabetes mellitus without complication ([place])    Pt states it's diet controlled.   GERD (gastroesophageal reflux disease)    Hyperlipidemia associated with type 2 diabetes mellitus (HCC)    Hypertension    Lung mass     Non-small cell lung cancer, right ([place]) 06/2020    Past Surgical History:  Procedure Laterality Date   IR IMAGING GUIDED PORT INSERTION  07/22/2020   lymphoma removal     back    Social History   Socioeconomic History   Marital status: Married    Spouse name: Not on file   Number of children: Not on file   Years of education: Not on file   Highest education level: Not on file  Occupational History   Not on file  Tobacco Use   Smoking status: Former    Types: Cigarettes    Quit date: 07/23/2007    Years since quitting: 14.1   Smokeless tobacco: Never  Vaping Use   Vaping Use: Never used  Substance and Sexual Activity   Alcohol use: Not Currently    Comment: none for 5 months   Drug use: Never   Sexual activity: Not on file  Other Topics Concern   Not on file  Social History Narrative   Moved from [place]; lives in [place] in 2021; daughter lives next door. Quit smoking- 13 years ago. Social alcohol. 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    Social Determinants of Health   Financial Resource Strain: Not on file  Food Insecurity: Not on file  Transportation Needs: Not on file  Physical Activity: Not on file  Stress: Not on file  Social Connections: Not on file  Intimate Partner Violence: Not on file    Family History  Problem Relation Age of Onset   Liver cancer Sister    Cancer - Other Brother        bone cancer [2001]     Current Outpatient Medications:    nirmatrelvir/ritonavir EUA (PAXLOVID) 20 x 150 MG & 10 x 100MG  TABS, Take 3 tablets by mouth 2 (two) times daily for 5 days. Take nirmatrelvir (150 mg) two tablets twice daily for 5 days and ritonavir (100 mg) one tablet twice daily for 5 days., Disp: 30 tablet, Rfl: 0   acetaminophen (TYLENOL) 500 MG tablet, Take 2 tablets by mouth every 8 (eight) hours as needed., Disp: , Rfl:    aspirin 325 MG EC tablet, Take 1 tablet by mouth daily., Disp: , Rfl:    atenolol (TENORMIN) 25 MG  tablet, Take 1 tablet by mouth daily., Disp: , Rfl:    atorvastatin (LIPITOR) 40 MG tablet, Take 1 tablet by mouth daily., Disp: , Rfl:    hydrochlorothiazide (HYDRODIURIL) 12.5 MG tablet, Take 12.5 mg by mouth daily., Disp: , Rfl:    lidocaine-prilocaine (EMLA) cream, Apply 1 application topically as needed., Disp: 30 g, Rfl: 4   losartan (COZAAR) 50 MG tablet, Take 1 tablet by mouth daily., Disp: , Rfl:    Melatonin 10 MG TABS, Take 1 tablet by mouth at bedtime., Disp: , Rfl:    metFORMIN (GLUCOPHAGE) 500 MG tablet, Take 1 tablet (500 mg total) by mouth 2 (two) times daily with a meal., Disp: 60 tablet, Rfl: 3   Omega-3 1000 MG CAPS, Take 1,000 mg by mouth in the morning and at bedtime., Disp: , Rfl:    omeprazole (PRILOSEC) 20 MG capsule, Take 20 mg by mouth in the morning and  at bedtime., Disp: , Rfl:    ondansetron (ZOFRAN) 8 MG tablet, One pill every 8 hours as needed for nausea/vomitting., Disp: 40 tablet, Rfl: 1   prochlorperazine (COMPAZINE) 10 MG tablet, Take 1 tablet (10 mg total) by mouth every 6 (six) hours as needed for nausea or vomiting., Disp: 40 tablet, Rfl: 1   tadalafil (CIALIS) 20 MG tablet, Take 1 tablet by mouth daily., Disp: , Rfl:  No current facility-administered medications for this visit.  Facility-Administered Medications Ordered in Other Visits:    heparin lock flush 100 unit/mL, 500 Units, Intracatheter, Once PRN, Cammie Sickle, MD   prochlorperazine (COMPAZINE) tablet 10 mg, 10 mg, Oral, Once, Cammie Sickle, MD  Physical exam: Exam limited due to telemedicine  There were no vitals filed for this visit. Physical Exam Pulmonary:     Effort: No respiratory distress.     Breath sounds: No wheezing.     Comments: Speaking in full sentences at good volume.  Neurological:     Mental Status: He is oriented to person, place, and time.  Psychiatric:        Mood and Affect: Mood normal.        Behavior: Behavior normal.     CMP Latest Ref Rng &  Units 09/01/2021  Glucose 70 - 99 mg/dL 151(H)  BUN 8 - 23 mg/dL 27(H)  Creatinine 0.61 - 1.24 mg/dL 1.18  Sodium 135 - 145 mmol/L 135  Potassium 3.5 - 5.1 mmol/L 3.7  Chloride 98 - 111 mmol/L 104  CO2 22 - 32 mmol/L 24  Calcium 8.9 - 10.3 mg/dL 8.5(L)  Total Protein 6.5 - 8.1 g/dL 6.6  Total Bilirubin 0.3 - 1.2 mg/dL 0.8  Alkaline Phos 38 - 126 U/L 75  AST 15 - 41 U/L 22  ALT 0 - 44 U/L 23   CBC Latest Ref Rng & Units 09/01/2021  WBC 4.0 - 10.5 K/uL 5.2  Hemoglobin 13.0 - 17.0 g/dL 12.7(L)  Hematocrit 39.0 - 52.0 % 39.1  Platelets 150 - 400 K/uL 159    No images are attached to the encounter.  No results found.  Assessment and plan- Patient is a 80 y.o. male diagnosed with metastatic lung cancer who is now positive for covid 19 infection based on home test. He is high risk of severe infection based on age and medical comorbidities. Recommend antiviral treatment with paxlovid. Labs and medications reviewed. Discussed supportive care. ER/Hospital precautions. Appointments at cancer center adjusted based on CDC guidance. He will notify clinic if symptoms don't improve or worsen.    Visit Diagnosis 1. COVID-19 virus infection     Patient expressed understanding and was in agreement with this plan. He also understands that He can call clinic at any time with any questions, concerns, or complaints.   I discussed the assessment and treatment plan with the patient. The patient was provided an opportunity to ask questions and all were answered. The patient agreed with the plan and demonstrated an understanding of the instructions.   The patient was advised to call back or seek an in-person evaluation if the symptoms worsen or if the condition fails to improve as anticipated.   I spent 15 minutes on this telephone encounter. Video connection was lost at >50% of the duration of the visit, at which time the remainder of the visit was completed via audio only.  Thank you for allowing me to  participate in the care of this very pleasant patient.   Beckey Rutter, DNP,  AGNP-[facility]

## 2021-09-17 NOTE — Telephone Encounter (Signed)
Patient is having generalized body aches when clothing touches patient's skin. Patient denies any fevers or shortness of breath. He has a good appetite and is sleeping well. He does have a sore throat.  Pt understands that Ander Purpura will do his mychart visit later this afternoon. Pt prefers to use [facility] in [place] if possible if eligible for Paxlovid. I called [facility] in [place]- This pharmacy does have the [place] in stock.  Apts next week for treatment- have been cnl until patient is fully recovered from the covid

## 2021-09-17 NOTE — Patient Instructions (Signed)
You are being prescribed PAXLOVID for COVID-19 infection.   Please pick up your prescription at: [facility] in [place], [place].   Please call the pharmacy or go through the drive through vs going inside if you are picking up the mediation yourself to prevent further spread.   ADMINISTRATION INSTRUCTIONS: Take with or without food. Swallow the tablets whole. Don't chew, crush, or break the medications because it might not work as well  For each dose of the medication, you should be taking 3 tablets together TWICE a day for FIVE days   Finish your full five-day course of Paxlovid even if you feel better before you're done. Stopping this medication too early can make it less effective to prevent severe illness related to [place].    Paxlovid is prescribed for YOU ONLY. Don't share it with others, even if they have similar symptoms as you. This medication might not be right for everyone.  Make sure to take steps to protect yourself and others while you're taking this medication in order to get well soon and to prevent others from getting sick with COVID-19.  Paxlovid (nirmatrelvir / ritonavir) can cause hormonal birth control medications to not work well. If you or your partner is currently taking hormonal birth control, use condoms or other birth control methods to prevent unintended pregnancies.  COMMON SIDE EFFECTS: Altered or bad taste in your mouth  Diarrhea  High blood pressure (1% of people) Muscle aches (1% of people)   If your COVID-19 symptoms get worse, get medical help right away. Call 911 if you experience symptoms such as worsening cough, trouble breathing, chest pain that doesn't go away, confusion, a hard time staying awake, and pale or blue-colored skin. This medication won't prevent all COVID-19 cases from getting worse.

## 2021-09-17 NOTE — Telephone Encounter (Signed)
Patient called reporting that he tested positive last night with a home COVID test and is positive. He is asking what to do. Please advise  He has appointment 10/4 for lab/md/Infusion

## 2021-09-19 ENCOUNTER — Encounter: Payer: Self-pay | Admitting: Internal Medicine

## 2021-09-19 NOTE — Telephone Encounter (Signed)
Patient's treatment - port lab/ md apts will need to be r/s in 2-3 weeks. [place]- pls call patient to coordinate.

## 2021-09-19 NOTE — Progress Notes (Signed)
I called patient to check on his recent Covid illness. Unable to reach him. Left a voicemail with the plan to move the appointments for next infusion the next 2 to 3 weeks. Recommend call us if any concerns or questions.  Dr. Jacinto Reap

## 2021-09-22 ENCOUNTER — Other Ambulatory Visit: Payer: Medicare PPO

## 2021-09-22 ENCOUNTER — Ambulatory Visit: Payer: Medicare PPO | Admitting: Internal Medicine

## 2021-09-22 ENCOUNTER — Ambulatory Visit: Payer: Medicare PPO

## 2021-09-23 ENCOUNTER — Inpatient Hospital Stay: Payer: Medicare PPO | Admitting: Internal Medicine

## 2021-09-23 ENCOUNTER — Inpatient Hospital Stay: Payer: Medicare PPO

## 2021-10-07 ENCOUNTER — Inpatient Hospital Stay: Payer: Medicare PPO | Admitting: Internal Medicine

## 2021-10-07 ENCOUNTER — Other Ambulatory Visit: Payer: Self-pay

## 2021-10-07 ENCOUNTER — Inpatient Hospital Stay: Payer: Medicare PPO

## 2021-10-07 ENCOUNTER — Inpatient Hospital Stay: Payer: Medicare PPO | Attending: Internal Medicine

## 2021-10-07 VITALS — BP 127/64 | HR 61 | Temp 97.6°F | Resp 20 | Ht 67.0 in | Wt 236.0 lb

## 2021-10-07 DIAGNOSIS — Z87891 Personal history of nicotine dependence: Secondary | ICD-10-CM | POA: Insufficient documentation

## 2021-10-07 DIAGNOSIS — C3431 Malignant neoplasm of lower lobe, right bronchus or lung: Secondary | ICD-10-CM

## 2021-10-07 DIAGNOSIS — I129 Hypertensive chronic kidney disease with stage 1 through stage 4 chronic kidney disease, or unspecified chronic kidney disease: Secondary | ICD-10-CM | POA: Insufficient documentation

## 2021-10-07 DIAGNOSIS — C7970 Secondary malignant neoplasm of unspecified adrenal gland: Secondary | ICD-10-CM | POA: Diagnosis not present

## 2021-10-07 DIAGNOSIS — E1122 Type 2 diabetes mellitus with diabetic chronic kidney disease: Secondary | ICD-10-CM | POA: Insufficient documentation

## 2021-10-07 DIAGNOSIS — N183 Chronic kidney disease, stage 3 unspecified: Secondary | ICD-10-CM | POA: Diagnosis not present

## 2021-10-07 DIAGNOSIS — Z5112 Encounter for antineoplastic immunotherapy: Secondary | ICD-10-CM | POA: Insufficient documentation

## 2021-10-07 DIAGNOSIS — E1165 Type 2 diabetes mellitus with hyperglycemia: Secondary | ICD-10-CM | POA: Diagnosis not present

## 2021-10-07 DIAGNOSIS — Z7984 Long term (current) use of oral hypoglycemic drugs: Secondary | ICD-10-CM | POA: Insufficient documentation

## 2021-10-07 LAB — COMPREHENSIVE METABOLIC PANEL
ALT: 17 U/L (ref 0–44)
AST: 20 U/L (ref 15–41)
Albumin: 3.7 g/dL (ref 3.5–5.0)
Alkaline Phosphatase: 85 U/L (ref 38–126)
Anion gap: 6 (ref 5–15)
BUN: 30 mg/dL — ABNORMAL HIGH (ref 8–23)
CO2: 27 mmol/L (ref 22–32)
Calcium: 8.6 mg/dL — ABNORMAL LOW (ref 8.9–10.3)
Chloride: 102 mmol/L (ref 98–111)
Creatinine, Ser: 1.44 mg/dL — ABNORMAL HIGH (ref 0.61–1.24)
GFR, Estimated: 50 mL/min — ABNORMAL LOW (ref >60)
Glucose, Bld: 98 mg/dL (ref 70–99)
Potassium: 4.1 mmol/L (ref 3.5–5.1)
Sodium: 135 mmol/L (ref 135–145)
Total Bilirubin: 0.7 mg/dL (ref 0.3–1.2)
Total Protein: 7.1 g/dL (ref 6.5–8.1)

## 2021-10-07 LAB — CBC WITH DIFFERENTIAL/PLATELET
Abs Immature Granulocytes: 0.01 10*3/uL (ref 0.00–0.07)
Basophils Absolute: 0 10*3/uL (ref 0.0–0.1)
Basophils Relative: 0 %
Eosinophils Absolute: 0.1 10*3/uL (ref 0.0–0.5)
Eosinophils Relative: 2 %
HCT: 39.1 % (ref 39.0–52.0)
Hemoglobin: 12.9 g/dL — ABNORMAL LOW (ref 13.0–17.0)
Immature Granulocytes: 0 %
Lymphocytes Relative: 42 %
Lymphs Abs: 2.1 10*3/uL (ref 0.7–4.0)
MCH: 29.1 pg (ref 26.0–34.0)
MCHC: 33 g/dL (ref 30.0–36.0)
MCV: 88.3 fL (ref 80.0–100.0)
Monocytes Absolute: 0.4 10*3/uL (ref 0.1–1.0)
Monocytes Relative: 8 %
Neutro Abs: 2.3 10*3/uL (ref 1.7–7.7)
Neutrophils Relative %: 48 %
Platelets: 157 10*3/uL (ref 150–400)
RBC: 4.43 MIL/uL (ref 4.22–5.81)
RDW: 14.3 % (ref 11.5–15.5)
WBC: 4.9 10*3/uL (ref 4.0–10.5)
nRBC: 0 % (ref 0.0–0.2)

## 2021-10-07 MED ORDER — HEPARIN SOD (PORK) LOCK FLUSH 100 UNIT/ML IV SOLN
500.0000 [IU] | Freq: Once | INTRAVENOUS | Status: AC | PRN
  Filled 2021-10-07: qty 5

## 2021-10-07 MED ORDER — SODIUM CHLORIDE 0.9 % IV SOLN
200.0000 mg | Freq: Once | INTRAVENOUS | Status: AC
  Administered 2021-10-07: 200 mg via INTRAVENOUS
  Filled 2021-10-07: qty 8

## 2021-10-07 MED ORDER — SODIUM CHLORIDE 0.9 % IV SOLN
Freq: Once | INTRAVENOUS | Status: AC
  Filled 2021-10-07: qty 250

## 2021-10-07 MED ORDER — HEPARIN SOD (PORK) LOCK FLUSH 100 UNIT/ML IV SOLN
INTRAVENOUS | Status: AC
  Administered 2021-10-07: 500 [IU]
  Filled 2021-10-07: qty 5

## 2021-10-07 NOTE — Addendum Note (Signed)
Addended by: Drue Dun on: 10/07/2021 01:38 PM   Modules accepted: Orders

## 2021-10-07 NOTE — Patient Instructions (Signed)
[facility]  Discharge Instructions: Thank you for choosing [facility] to provide your oncology and hematology care.  If you have a lab appointment with the [facility], please go directly to the [facility] and check in at the registration area.  Wear comfortable clothing and clothing appropriate for easy access to any Portacath or PICC line.   We strive to give you quality time with your provider. You may need to reschedule your appointment if you arrive late (15 or more minutes).  Arriving late affects you and other patients whose appointments are after yours.  Also, if you miss three or more appointments without notifying the office, you may be dismissed from the clinic at the provider's discretion.      For prescription refill requests, have your pharmacy contact our office and allow 72 hours for refills to be completed.    Today you received the following chemotherapy and/or immunotherapy agents Beryle Flock   To help prevent nausea and vomiting after your treatment, we encourage you to take your nausea medication as directed.  BELOW ARE SYMPTOMS THAT SHOULD BE REPORTED IMMEDIATELY: *FEVER GREATER THAN 100.4 F (38 C) OR HIGHER *CHILLS OR SWEATING *NAUSEA AND VOMITING THAT IS NOT CONTROLLED WITH YOUR NAUSEA MEDICATION *UNUSUAL SHORTNESS OF BREATH *UNUSUAL BRUISING OR BLEEDING *URINARY PROBLEMS (pain or burning when urinating, or frequent urination) *BOWEL PROBLEMS (unusual diarrhea, constipation, pain near the anus) TENDERNESS IN MOUTH AND THROAT WITH OR WITHOUT PRESENCE OF ULCERS (sore throat, sores in mouth, or a toothache) UNUSUAL RASH, SWELLING OR PAIN  UNUSUAL VAGINAL DISCHARGE OR ITCHING   Items with * indicate a potential emergency and should be followed up as soon as possible or go to the Emergency Department if any problems should occur.  Please show the CHEMOTHERAPY ALERT CARD or IMMUNOTHERAPY ALERT CARD at check-in to  the Emergency Department and triage nurse.  Should you have questions after your visit or need to cancel or reschedule your appointment, please contact [facility]  [phone] and follow the prompts.  Office hours are 8:00 a.m. to 4:30 p.m. Monday - Friday. Please note that voicemails left after 4:00 p.m. may not be returned until the following business day.  We are closed weekends and major holidays. You have access to a nurse at all times for urgent questions. Please call the main number to the clinic [phone] and follow the prompts.  For any non-urgent questions, you may also contact your provider using MyChart. We now offer e-Visits for anyone 18 and older to request care online for non-urgent symptoms. For details visit [email or web address].   Also download the MyChart app! Go to the app store, search "MyChart", open the app, select [facility], and log in with your MyChart username and password.  Due to Covid, a mask is required upon entering the hospital/clinic. If you do not have a mask, one will be given to you upon arrival. For doctor visits, patients may have 1 support person aged 34 or older with them. For treatment visits, patients cannot have anyone with them due to current Covid guidelines and our immunocompromised population.

## 2021-10-07 NOTE — Progress Notes (Signed)
[facility] NOTE  Patient Care Team: Ezequiel Kayser, MD (Inactive) as PCP - General (Internal Medicine) Telford Nab, RN as Oncology Nurse Navigator  CHIEF COMPLAINTS/PURPOSE OF CONSULTATION: lung cancer  #  Oncology History Overview Note  # July 2021- RLL- 9.4 cm x 7.5 cm x 6.7 cm lobulated low-attenuation heterogeneous lung mass within the posteromedial aspect of the right lower lobe, at the level of the right hilum; positive for hilar; mediastinal adenopathy; PET scan-July 2021 + for adrenal metastases; CT-guided biopsy-non-small cell favor adenocarcinoma; necrosis  # carbo-alimtac  [name]; NGS pending] cycle #2 carbo Alimta [place]; 10/18/2020-maintenance Keytruda  # 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 CKD  # NGS/MOLECULAR TESTS: TPS =70%; rest **  # PALLIATIVE CARE EVALUATION:     DIAGNOSIS: Lung cancer  STAGE: 4       ;  GOALS: Palliative  CURRENT/MOST RECENT THERAPY : Carbo Alimta Keytruda    Cancer of lower lobe of right lung ([facility])  07/05/2020 Initial Diagnosis   Cancer of lower lobe of right lung ([facility])   07/26/2020 -  Chemotherapy   Patient is on Treatment Plan : LUNG CARBOplatin / Pemetrexed / Pembrolizumab q21d Induction x 4 cycles / Maintenance Pemetrexed + Pembrolizumab       HISTORY OF PRESENTING ILLNESS:  Ralph Williams 80 y.o.  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  S/p recent COVID- s/p paxlovid. Did not need any hospitalization.   No worsening shortness of breath or cough.  Appetite is good.  No weight loss no headaches.  Review of Systems  Constitutional:  Negative for chills, diaphoresis, fever, malaise/fatigue and weight loss.  HENT:  Negative for nosebleeds and sore throat.   Eyes:  Negative for double vision.  Respiratory:  Negative for hemoptysis, sputum  production and shortness of breath.   Cardiovascular:  Negative for chest pain, palpitations, orthopnea and leg swelling.  Gastrointestinal:  Negative for abdominal pain, blood in stool, constipation, diarrhea, heartburn, melena, nausea and vomiting.  Genitourinary:  Negative for dysuria, frequency and urgency.  Musculoskeletal:  Negative for back pain and joint pain.  Neurological:  Negative for dizziness, tingling, focal weakness, weakness and headaches.  Endo/Heme/Allergies:  Does not bruise/bleed easily.  Psychiatric/Behavioral:  Negative for depression. The patient is not nervous/anxious.     MEDICAL HISTORY:  Past Medical History:  Diagnosis Date   Chronic renal insufficiency    Diabetes mellitus without complication ([facility])    Pt states it's diet controlled.   GERD (gastroesophageal reflux disease)    Hyperlipidemia associated with type 2 diabetes mellitus ([facility])    Hypertension    Lung mass    Non-small cell lung cancer, right ([facility]) 06/2020    SURGICAL HISTORY: Past Surgical History:  Procedure Laterality Date   IR IMAGING GUIDED PORT INSERTION  07/22/2020   lymphoma removal     back    SOCIAL HISTORY: Social History   Socioeconomic History   Marital status: Married    Spouse name: Not on file   Number of children: Not on file   Years of education: Not on file   Highest education level: Not on file  Occupational History   Not on file  Tobacco Use   Smoking status: Former    Types: Cigarettes    Quit date: 07/23/2007    Years since quitting: 53.2  Smokeless tobacco: Never  Vaping Use   Vaping Use: Never used  Substance and Sexual Activity   Alcohol use: Not Currently    Comment: none for 5 months   Drug use: Never   Sexual activity: Not on file  Other Topics Concern   Not on file  Social History Narrative   Moved from [place]; lives in [place] in 2021; daughter lives next door. Quit smoking- 13 years ago. Social alcohol. 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    Social Determinants of Health   Financial Resource Strain: Not on file  Food Insecurity: Not on file  Transportation Needs: Not on file  Physical Activity: Not on file  Stress: Not on file  Social Connections: Not on file  Intimate Partner Violence: Not on file    FAMILY HISTORY: Family History  Problem Relation Age of Onset   Liver cancer Sister    Cancer - Other Brother        bone cancer [2001]    ALLERGIES:  is allergic to niacin and related.  MEDICATIONS:  Current Outpatient Medications  Medication Sig Dispense Refill   acetaminophen (TYLENOL) 500 MG tablet Take 2 tablets by mouth every 8 (eight) hours as needed.     aspirin 325 MG EC tablet Take 1 tablet by mouth daily.     atenolol (TENORMIN) 25 MG tablet Take 1 tablet by mouth daily.     atorvastatin (LIPITOR) 40 MG tablet Take 1 tablet by mouth daily.     hydrochlorothiazide (HYDRODIURIL) 12.5 MG tablet Take 12.5 mg by mouth daily.     lidocaine-prilocaine (EMLA) cream Apply 1 application topically as needed. 30 g 4   losartan (COZAAR) 50 MG tablet Take 1 tablet by mouth daily.     Melatonin 10 MG TABS Take 1 tablet by mouth at bedtime.     metFORMIN (GLUCOPHAGE) 500 MG tablet Take 1 tablet (500 mg total) by mouth 2 (two) times daily with a meal. 60 tablet 3   Omega-3 1000 MG CAPS Take 1,000 mg by mouth in the morning and at bedtime.     omeprazole (PRILOSEC) 20 MG capsule Take 20 mg by mouth in the morning and at bedtime.     tadalafil (CIALIS) 20 MG tablet Take 1 tablet by mouth daily.     ondansetron (ZOFRAN) 8 MG tablet One pill every 8 hours as needed for nausea/vomitting. (Patient not taking: Reported on 10/07/2021) 40 tablet 1   prochlorperazine (COMPAZINE) 10 MG tablet Take 1 tablet (10 mg total) by mouth every 6 (six) hours as needed for nausea or vomiting. (Patient not taking: Reported on 10/07/2021) 40 tablet 1   No current facility-administered medications for this  visit.   Facility-Administered Medications Ordered in Other Visits  Medication Dose Route Frequency Provider Last Rate Last Admin   heparin lock flush 100 unit/mL  500 Units Intracatheter Once PRN Cammie Sickle, MD       prochlorperazine (COMPAZINE) tablet 10 mg  10 mg Oral Once Cammie Sickle, MD          .  PHYSICAL EXAMINATION: ECOG PERFORMANCE STATUS: 1 - Symptomatic but completely ambulatory  Vitals:   10/07/21 1310  BP: 127/64  Pulse: 61  Resp: 20  Temp: 97.6 F (36.4 C)   Filed Weights   10/07/21 1310  Weight: 236 lb (107 kg)     Physical Exam Constitutional:      Comments: Ambulating independently.  Accompanied by his wife.   HENT:  Head: Normocephalic and atraumatic.     Mouth/Throat:     Pharynx: No oropharyngeal exudate.  Eyes:     Pupils: Pupils are equal, round, and reactive to light.  Cardiovascular:     Rate and Rhythm: Normal rate and regular rhythm.  Pulmonary:     Effort: Pulmonary effort is normal. No respiratory distress.     Breath sounds: Normal breath sounds. No wheezing.  Abdominal:     General: Bowel sounds are normal. There is no distension.     Palpations: Abdomen is soft. There is no mass.     Tenderness: There is no abdominal tenderness. There is no guarding or rebound.  Musculoskeletal:        General: No tenderness. Normal range of motion.     Cervical back: Normal range of motion and neck supple.  Skin:    General: Skin is warm.  Neurological:     Mental Status: He is alert and oriented to person, place, and time.  Psychiatric:        Mood and Affect: Affect normal.     LABORATORY DATA:  I have reviewed the data as listed Lab Results  Component Value Date   WBC 4.9 10/07/2021   HGB 12.9 (L) 10/07/2021   HCT 39.1 10/07/2021   MCV 88.3 10/07/2021   PLT 157 10/07/2021   Recent Labs    08/11/21 0854 09/01/21 0911 10/07/21 1229  NA 137 135 135  K 3.9 3.7 4.1  CL 106 104 102  CO2 25 24 27   GLUCOSE  124* 151* 98  BUN 30* 27* 30*  CREATININE 1.35* 1.18 1.44*  CALCIUM 8.9 8.5* 8.6*  GFRNONAA 54* >60 50*  PROT 6.9 6.6 7.1  ALBUMIN 3.7 3.8 3.7  AST 20 22 20   ALT 23 23 17   ALKPHOS 75 75 85  BILITOT 0.8 0.8 0.7    RADIOGRAPHIC STUDIES: I have personally reviewed the radiological images as listed and agreed with the findings in the report. No results found.  ASSESSMENT & PLAN:   Cancer of lower lobe of right lung ([facility]) # Lung cancer-non-small cell favor adeno ca; Stage IV; July 1st 2022- Stable post treatment change in the perihilar RIGHT lower lobe; No evidence of lung cancer recurrence or progression;  linear nodular thickening in the LEFT upper lobe. Stable  # proceed with Keytruda maintenance Labs today reviewed;  acceptable for treatment today. STABLE; will order imaging today/evaluate next visit.  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 On Metformin 500 mg BID; monitor now.    # DISPOSITION: later appt in AM # Keytruda today # follow up in 3 weeks MD; labs- cbc/cmp;TSH;   Keytruda; CT chest priro--Dr.B     All questions were answered. The patient knows to call the clinic with any problems, questions or concerns.    Cammie Sickle, MD 10/07/2021 1:34 PM

## 2021-10-07 NOTE — Assessment & Plan Note (Signed)
#   Lung cancer-non-small cell favor adeno ca; Stage IV; July 1st 2022- Stable post treatment change in the perihilar RIGHT lower lobe; No evidence of lung cancer recurrence or progression;  linear nodular thickening in the LEFT upper lobe. Stable  # proceed with Keytruda maintenance Labs today reviewed;  acceptable for treatment today. STABLE; will order imaging today/evaluate next visit.  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 On Metformin 500 mg BID; monitor now.    # DISPOSITION: later appt in AM # Keytruda today # follow up in 3 weeks MD; labs- cbc/cmp;TSH;   Keytruda; CT chest priro--Dr.B

## 2021-10-24 ENCOUNTER — Ambulatory Visit
Admission: RE | Admit: 2021-10-24 | Discharge: 2021-10-24 | Disposition: A | Discharge status: Home / Self Care | Payer: Medicare PPO | Source: Ambulatory Visit | Attending: Internal Medicine | Admitting: Internal Medicine

## 2021-10-24 ENCOUNTER — Other Ambulatory Visit: Payer: Self-pay

## 2021-10-24 DIAGNOSIS — C3431 Malignant neoplasm of lower lobe, right bronchus or lung: Secondary | ICD-10-CM | POA: Diagnosis not present

## 2021-10-28 ENCOUNTER — Encounter: Payer: Self-pay | Admitting: *Deleted

## 2021-10-28 ENCOUNTER — Other Ambulatory Visit: Payer: Self-pay

## 2021-10-28 ENCOUNTER — Telehealth: Payer: Self-pay | Admitting: *Deleted

## 2021-10-28 ENCOUNTER — Inpatient Hospital Stay: Payer: Medicare PPO | Attending: Internal Medicine

## 2021-10-28 ENCOUNTER — Inpatient Hospital Stay: Payer: Medicare PPO

## 2021-10-28 ENCOUNTER — Inpatient Hospital Stay: Payer: Medicare PPO | Admitting: Internal Medicine

## 2021-10-28 VITALS — BP 118/68 | HR 54 | Temp 98.1°F | Resp 18 | Wt 238.0 lb

## 2021-10-28 DIAGNOSIS — N189 Chronic kidney disease, unspecified: Secondary | ICD-10-CM | POA: Insufficient documentation

## 2021-10-28 DIAGNOSIS — C7970 Secondary malignant neoplasm of unspecified adrenal gland: Secondary | ICD-10-CM | POA: Insufficient documentation

## 2021-10-28 DIAGNOSIS — Z87891 Personal history of nicotine dependence: Secondary | ICD-10-CM | POA: Diagnosis not present

## 2021-10-28 DIAGNOSIS — Z5112 Encounter for antineoplastic immunotherapy: Secondary | ICD-10-CM | POA: Insufficient documentation

## 2021-10-28 DIAGNOSIS — Z79899 Other long term (current) drug therapy: Secondary | ICD-10-CM | POA: Insufficient documentation

## 2021-10-28 DIAGNOSIS — C642 Malignant neoplasm of left kidney, except renal pelvis: Secondary | ICD-10-CM | POA: Diagnosis not present

## 2021-10-28 DIAGNOSIS — C3431 Malignant neoplasm of lower lobe, right bronchus or lung: Secondary | ICD-10-CM

## 2021-10-28 DIAGNOSIS — E1122 Type 2 diabetes mellitus with diabetic chronic kidney disease: Secondary | ICD-10-CM | POA: Insufficient documentation

## 2021-10-28 DIAGNOSIS — E1165 Type 2 diabetes mellitus with hyperglycemia: Secondary | ICD-10-CM | POA: Insufficient documentation

## 2021-10-28 DIAGNOSIS — Z7984 Long term (current) use of oral hypoglycemic drugs: Secondary | ICD-10-CM | POA: Insufficient documentation

## 2021-10-28 DIAGNOSIS — K219 Gastro-esophageal reflux disease without esophagitis: Secondary | ICD-10-CM | POA: Insufficient documentation

## 2021-10-28 DIAGNOSIS — C771 Secondary and unspecified malignant neoplasm of intrathoracic lymph nodes: Secondary | ICD-10-CM | POA: Diagnosis not present

## 2021-10-28 DIAGNOSIS — I2510 Atherosclerotic heart disease of native coronary artery without angina pectoris: Secondary | ICD-10-CM | POA: Diagnosis not present

## 2021-10-28 DIAGNOSIS — Z955 Presence of coronary angioplasty implant and graft: Secondary | ICD-10-CM | POA: Insufficient documentation

## 2021-10-28 DIAGNOSIS — E785 Hyperlipidemia, unspecified: Secondary | ICD-10-CM | POA: Diagnosis not present

## 2021-10-28 DIAGNOSIS — Z7982 Long term (current) use of aspirin: Secondary | ICD-10-CM | POA: Diagnosis not present

## 2021-10-28 DIAGNOSIS — I129 Hypertensive chronic kidney disease with stage 1 through stage 4 chronic kidney disease, or unspecified chronic kidney disease: Secondary | ICD-10-CM | POA: Diagnosis not present

## 2021-10-28 LAB — COMPREHENSIVE METABOLIC PANEL
ALT: 18 U/L (ref 0–44)
AST: 19 U/L (ref 15–41)
Albumin: 3.8 g/dL (ref 3.5–5.0)
Alkaline Phosphatase: 78 U/L (ref 38–126)
Anion gap: 8 (ref 5–15)
BUN: 26 mg/dL — ABNORMAL HIGH (ref 8–23)
CO2: 26 mmol/L (ref 22–32)
Calcium: 8.6 mg/dL — ABNORMAL LOW (ref 8.9–10.3)
Chloride: 101 mmol/L (ref 98–111)
Creatinine, Ser: 1.44 mg/dL — ABNORMAL HIGH (ref 0.61–1.24)
GFR, Estimated: 50 mL/min — ABNORMAL LOW (ref >60)
Glucose, Bld: 94 mg/dL (ref 70–99)
Potassium: 4.2 mmol/L (ref 3.5–5.1)
Sodium: 135 mmol/L (ref 135–145)
Total Bilirubin: 0.5 mg/dL (ref 0.3–1.2)
Total Protein: 6.8 g/dL (ref 6.5–8.1)

## 2021-10-28 LAB — CBC WITH DIFFERENTIAL/PLATELET
Abs Immature Granulocytes: 0.01 10*3/uL (ref 0.00–0.07)
Basophils Absolute: 0 10*3/uL (ref 0.0–0.1)
Basophils Relative: 0 %
Eosinophils Absolute: 0.1 10*3/uL (ref 0.0–0.5)
Eosinophils Relative: 1 %
HCT: 37.6 % — ABNORMAL LOW (ref 39.0–52.0)
Hemoglobin: 12.3 g/dL — ABNORMAL LOW (ref 13.0–17.0)
Immature Granulocytes: 0 %
Lymphocytes Relative: 41 %
Lymphs Abs: 2.5 10*3/uL (ref 0.7–4.0)
MCH: 28.6 pg (ref 26.0–34.0)
MCHC: 32.7 g/dL (ref 30.0–36.0)
MCV: 87.4 fL (ref 80.0–100.0)
Monocytes Absolute: 0.6 10*3/uL (ref 0.1–1.0)
Monocytes Relative: 10 %
Neutro Abs: 3 10*3/uL (ref 1.7–7.7)
Neutrophils Relative %: 48 %
Platelets: 162 10*3/uL (ref 150–400)
RBC: 4.3 MIL/uL (ref 4.22–5.81)
RDW: 14.3 % (ref 11.5–15.5)
WBC: 6.2 10*3/uL (ref 4.0–10.5)
nRBC: 0 % (ref 0.0–0.2)

## 2021-10-28 LAB — TSH: TSH: 1.275 u[IU]/mL (ref 0.350–4.500)

## 2021-10-28 MED ORDER — SODIUM CHLORIDE 0.9 % IV SOLN
200.0000 mg | Freq: Once | INTRAVENOUS | Status: AC
  Administered 2021-10-28: 200 mg via INTRAVENOUS
  Filled 2021-10-28: qty 8

## 2021-10-28 MED ORDER — PROCHLORPERAZINE MALEATE 10 MG PO TABS
10.0000 mg | ORAL_TABLET | Freq: Once | ORAL | Status: DC
  Filled 2021-10-28: qty 1

## 2021-10-28 MED ORDER — HEPARIN SOD (PORK) LOCK FLUSH 100 UNIT/ML IV SOLN
500.0000 [IU] | Freq: Once | INTRAVENOUS | Status: AC | PRN
  Administered 2021-10-28: 500 [IU]
  Filled 2021-10-28: qty 5

## 2021-10-28 MED ORDER — SODIUM CHLORIDE 0.9 % IV SOLN
Freq: Once | INTRAVENOUS | Status: AC
  Filled 2021-10-28: qty 250

## 2021-10-28 NOTE — Assessment & Plan Note (Addendum)
#   Lung cancer-non-small cell favor adeno ca; Stage IV;NOV 5th, 2022- Stable exam. No new or progressive findings to suggest recurrent or metastatic disease; Stable- 19 mm exophytic lesion upper pole left kidney characterized as solid enhancing neoplasm on MRI of 01/22/2021.  # proceed with Keytruda maintenance Labs today reviewed;  acceptable for treatment today.   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  I again reviewed the options.  Patient is interested in referral to Urology-[facility]. CT NOV 2022- STABLE.   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   # Elevated Blood glucose-FBG- 94; MAY 2022-Hb1a C-6.5. On Metformin 500 mg BID; monitor now.  STABLE.   # DISPOSITION: later appt in AM # Referral to Urology- [facility], [place] re: left kidney cancer # Keytruda today # follow up on DEC 2nd MD; labs- cbc/cmp--Dr.B  # I reviewed the blood work- with the patient in detail; also reviewed the imaging independently [as summarized above]; and with the patient in detail.

## 2021-10-28 NOTE — Telephone Encounter (Signed)
Urology referral faxed to:  [facility] [address] [place], [place] [address] (phone) [phone]

## 2021-10-28 NOTE — Progress Notes (Signed)
[place] NOTE  Patient Care Team: Ezequiel Kayser, MD (Inactive) as PCP - General (Internal Medicine) Telford Nab, RN as Oncology Nurse Navigator  CHIEF COMPLAINTS/PURPOSE OF CONSULTATION: lung cancer  #  Oncology History Overview Note  # July 2021- RLL- 9.4 cm x 7.5 cm x 6.7 cm lobulated low-attenuation heterogeneous lung mass within the posteromedial aspect of the right lower lobe, at the level of the right hilum; positive for hilar; mediastinal adenopathy; PET scan-July 2021 + for adrenal metastases; CT-guided biopsy-non-small cell favor adenocarcinoma; necrosis  # carbo-alimtac  [name]; NGS pending] cycle #2 carbo Alimta [place]; 10/18/2020-maintenance Keytruda  # 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 CKD  # NGS/MOLECULAR TESTS: TPS =70%; rest **  # PALLIATIVE CARE EVALUATION:     DIAGNOSIS: Lung cancer  STAGE: 4       ;  GOALS: Palliative  CURRENT/MOST RECENT THERAPY : Carbo Alimta Keytruda    Cancer of lower lobe of right lung ([place])  07/05/2020 Initial Diagnosis   Cancer of lower lobe of right lung ([place])   07/26/2020 -  Chemotherapy   Patient is on Treatment Plan : LUNG CARBOplatin / Pemetrexed / Pembrolizumab q21d Induction x 4 cycles / Maintenance Pemetrexed + Pembrolizumab       HISTORY OF PRESENTING ILLNESS: Ambulating independently.  Accompanied by his wife.  Niam Coyt 80 y.o.  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  No nausea no vomiting.  Appetite is good.  No weight loss.  Denies any headaches.  No worsening shortness of breath or cough.    Review of Systems  Constitutional:  Negative for chills, diaphoresis, fever, malaise/fatigue and weight loss.  HENT:  Negative for nosebleeds and sore throat.   Eyes:  Negative for double  vision.  Respiratory:  Negative for hemoptysis, sputum production and shortness of breath.   Cardiovascular:  Negative for chest pain, palpitations, orthopnea and leg swelling.  Gastrointestinal:  Negative for abdominal pain, blood in stool, constipation, diarrhea, heartburn, melena, nausea and vomiting.  Genitourinary:  Negative for dysuria, frequency and urgency.  Musculoskeletal:  Negative for back pain and joint pain.  Neurological:  Negative for dizziness, tingling, focal weakness, weakness and headaches.  Endo/Heme/Allergies:  Does not bruise/bleed easily.  Psychiatric/Behavioral:  Negative for depression. The patient is not nervous/anxious.     MEDICAL HISTORY:  Past Medical History:  Diagnosis Date   Chronic renal insufficiency    Diabetes mellitus without complication ([place])    Pt states it's diet controlled.   GERD (gastroesophageal reflux disease)    Hyperlipidemia associated with type 2 diabetes mellitus (HCC)    Hypertension    Lung mass    Non-small cell lung cancer, right ([place]) 06/2020    SURGICAL HISTORY: Past Surgical History:  Procedure Laterality Date   IR IMAGING GUIDED PORT INSERTION  07/22/2020   lymphoma removal     back    SOCIAL HISTORY: Social History   Socioeconomic History   Marital status: Married    Spouse name: Not on file   Number of children: Not on file   Years of education: Not on file   Highest education level: Not on file  Occupational History   Not on file  Tobacco Use   Smoking status: Former    Types: Cigarettes    Quit date:  07/23/2007    Years since quitting: 14.2   Smokeless tobacco: Never  Vaping Use   Vaping Use: Never used  Substance and Sexual Activity   Alcohol use: Not Currently    Comment: none for 5 months   Drug use: Never   Sexual activity: Not on file  Other Topics Concern   Not on file  Social History Narrative   Moved from [place]; lives in [place] in 2021; daughter lives next door. Quit smoking- 13  years ago. Social alcohol. 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    Social Determinants of Health   Financial Resource Strain: Not on file  Food Insecurity: Not on file  Transportation Needs: Not on file  Physical Activity: Not on file  Stress: Not on file  Social Connections: Not on file  Intimate Partner Violence: Not on file    FAMILY HISTORY: Family History  Problem Relation Age of Onset   Liver cancer Sister    Cancer - Other Brother        bone cancer [2001]    ALLERGIES:  is allergic to niacin and related.  MEDICATIONS:  Current Outpatient Medications  Medication Sig Dispense Refill   acetaminophen (TYLENOL) 500 MG tablet Take 2 tablets by mouth every 8 (eight) hours as needed.     aspirin 325 MG EC tablet Take 1 tablet by mouth daily.     atenolol (TENORMIN) 25 MG tablet Take 1 tablet by mouth daily.     atorvastatin (LIPITOR) 40 MG tablet Take 1 tablet by mouth daily.     hydrochlorothiazide (HYDRODIURIL) 12.5 MG tablet Take 12.5 mg by mouth daily.     lidocaine-prilocaine (EMLA) cream Apply 1 application topically as needed. 30 g 4   losartan (COZAAR) 50 MG tablet Take 1 tablet by mouth daily.     Melatonin 10 MG TABS Take 1 tablet by mouth at bedtime.     metFORMIN (GLUCOPHAGE) 500 MG tablet Take 1 tablet (500 mg total) by mouth 2 (two) times daily with a meal. 60 tablet 3   Omega-3 1000 MG CAPS Take 1,000 mg by mouth in the morning and at bedtime.     omeprazole (PRILOSEC) 20 MG capsule Take 20 mg by mouth in the morning and at bedtime.     tadalafil (CIALIS) 20 MG tablet Take 1 tablet by mouth daily.     ondansetron (ZOFRAN) 8 MG tablet One pill every 8 hours as needed for nausea/vomitting. (Patient not taking: No sig reported) 40 tablet 1   prochlorperazine (COMPAZINE) 10 MG tablet Take 1 tablet (10 mg total) by mouth every 6 (six) hours as needed for nausea or vomiting. (Patient not taking: No sig reported) 40 tablet 1   No current  facility-administered medications for this visit.   Facility-Administered Medications Ordered in Other Visits  Medication Dose Route Frequency Provider Last Rate Last Admin   heparin lock flush 100 unit/mL  500 Units Intracatheter Once PRN Cammie Sickle, MD       prochlorperazine (COMPAZINE) tablet 10 mg  10 mg Oral Once Cammie Sickle, MD          .  PHYSICAL EXAMINATION: ECOG PERFORMANCE STATUS: 1 - Symptomatic but completely ambulatory  Vitals:   10/28/21 1045  BP: 118/68  Pulse: (!) 54  Resp: 18  Temp: 98.1 F (36.7 C)  SpO2: 98%   Filed Weights   10/28/21 1042  Weight: 238 lb (108 kg)     Physical Exam Constitutional:  Comments: Ambulating independently.  Accompanied by his wife.   HENT:     Head: Normocephalic and atraumatic.     Mouth/Throat:     Pharynx: No oropharyngeal exudate.  Eyes:     Pupils: Pupils are equal, round, and reactive to light.  Cardiovascular:     Rate and Rhythm: Normal rate and regular rhythm.  Pulmonary:     Effort: Pulmonary effort is normal. No respiratory distress.     Breath sounds: Normal breath sounds. No wheezing.  Abdominal:     General: Bowel sounds are normal. There is no distension.     Palpations: Abdomen is soft. There is no mass.     Tenderness: There is no abdominal tenderness. There is no guarding or rebound.  Musculoskeletal:        General: No tenderness. Normal range of motion.     Cervical back: Normal range of motion and neck supple.  Skin:    General: Skin is warm.  Neurological:     Mental Status: He is alert and oriented to person, place, and time.  Psychiatric:        Mood and Affect: Affect normal.     LABORATORY DATA:  I have reviewed the data as listed Lab Results  Component Value Date   WBC 6.2 10/28/2021   HGB 12.3 (L) 10/28/2021   HCT 37.6 (L) 10/28/2021   MCV 87.4 10/28/2021   PLT 162 10/28/2021   Recent Labs    09/01/21 0911 10/07/21 1229 10/28/21 0948  NA 135  135 135  K 3.7 4.1 4.2  CL 104 102 101  CO2 24 27 26   GLUCOSE 151* 98 94  BUN 27* 30* 26*  CREATININE 1.18 1.44* 1.44*  CALCIUM 8.5* 8.6* 8.6*  GFRNONAA >60 50* 50*  PROT 6.6 7.1 6.8  ALBUMIN 3.8 3.7 3.8  AST 22 20 19   ALT 23 17 18   ALKPHOS 75 85 78  BILITOT 0.8 0.7 0.5    RADIOGRAPHIC STUDIES: I have personally reviewed the radiological images as listed and agreed with the findings in the report. CT CHEST WO CONTRAST  Result Date: 10/25/2021 CLINICAL DATA:  Non-small-cell lung cancer.  Restaging. EXAM: CT CHEST WITHOUT CONTRAST TECHNIQUE: Multidetector CT imaging of the chest was performed following the standard protocol without IV contrast. COMPARISON:  06/20/2021 FINDINGS: Cardiovascular: The heart size is normal. No substantial pericardial effusion. Coronary artery calcification is evident. Mild atherosclerotic calcification is noted in the wall of the thoracic aorta. Right Port-A-Cath tip is positioned in the distal SVC. Mediastinum/Nodes: No mediastinal lymphadenopathy. No evidence for gross hilar lymphadenopathy although assessment is limited by the lack of intravenous contrast on the current study. The esophagus has normal imaging features. There is no axillary lymphadenopathy. 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 Small focus of scarring with calcification in the anterior left upper lobe is unchanged. There is no new suspicious pulmonary nodule or mass. No pleural effusion. Upper Abdomen: Stable exophytic water density lesion upper pole left kidney compatible with a cyst. 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 Musculoskeletal: No worrisome lytic or sclerotic osseous abnormality. IMPRESSION: 1. Stable exam. No new or  progressive findings to suggest recurrent or metastatic disease. 2. Stable appearance of post radiation fibrosis in the retro hilar right  lower lobe. 3. 19 mm exophytic lesion upper pole left kidney characterized as solid enhancing neoplasm on MRI of 01/22/2021. 4. Aortic Atherosclerosis (ICD10-I70.0). Electronically Signed   By: Misty Stanley M.D.   On: 10/25/2021 13:29    ASSESSMENT & PLAN:   Cancer of lower lobe of right lung ([place]) # Lung cancer-non-small cell favor adeno ca; Stage IV;NOV 5th, 2022- Stable exam. No new or progressive findings to suggest recurrent or metastatic disease; Stable- 19 mm exophytic lesion upper pole left kidney characterized as solid enhancing neoplasm on MRI of 01/22/2021.  # proceed with Keytruda maintenance Labs today reviewed;  acceptable for treatment today.   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  I again reviewed the options.  Patient is interested in referral to Urology-ECU. CT NOV 2022- STABLE.   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   # Elevated Blood glucose-FBG- 94; MAY 2022-Hb1a C-6.5. On Metformin 500 mg BID; monitor now.  STABLE.   # DISPOSITION: later appt in AM # Referral to Urology- [facility], Katie re: left kidney cancer # Keytruda today # follow up on DEC 2nd MD; labs- cbc/cmp--Dr.B  # I reviewed the blood work- with the patient in detail; also reviewed the imaging independently [as summarized above]; and with the patient in detail.       All questions were answered. The patient knows to call the clinic with any problems, questions or concerns.    Cammie Sickle, MD 10/28/2021 11:09 AM

## 2021-10-28 NOTE — Progress Notes (Signed)
Patient states that he has no questions or concerns at this time

## 2021-11-18 ENCOUNTER — Other Ambulatory Visit: Payer: Self-pay | Admitting: *Deleted

## 2021-11-18 DIAGNOSIS — C3431 Malignant neoplasm of lower lobe, right bronchus or lung: Secondary | ICD-10-CM

## 2021-11-21 ENCOUNTER — Inpatient Hospital Stay: Payer: Medicare PPO

## 2021-11-21 ENCOUNTER — Encounter: Payer: Self-pay | Admitting: Internal Medicine

## 2021-11-21 ENCOUNTER — Other Ambulatory Visit: Payer: Self-pay

## 2021-11-21 ENCOUNTER — Inpatient Hospital Stay: Payer: Medicare PPO | Attending: Internal Medicine

## 2021-11-21 ENCOUNTER — Inpatient Hospital Stay: Payer: Medicare PPO | Admitting: Internal Medicine

## 2021-11-21 DIAGNOSIS — E119 Type 2 diabetes mellitus without complications: Secondary | ICD-10-CM | POA: Insufficient documentation

## 2021-11-21 DIAGNOSIS — N183 Chronic kidney disease, stage 3 unspecified: Secondary | ICD-10-CM | POA: Insufficient documentation

## 2021-11-21 DIAGNOSIS — Z85528 Personal history of other malignant neoplasm of kidney: Secondary | ICD-10-CM | POA: Insufficient documentation

## 2021-11-21 DIAGNOSIS — Z7984 Long term (current) use of oral hypoglycemic drugs: Secondary | ICD-10-CM | POA: Insufficient documentation

## 2021-11-21 DIAGNOSIS — C3431 Malignant neoplasm of lower lobe, right bronchus or lung: Secondary | ICD-10-CM

## 2021-11-21 DIAGNOSIS — C7972 Secondary malignant neoplasm of left adrenal gland: Secondary | ICD-10-CM | POA: Diagnosis not present

## 2021-11-21 DIAGNOSIS — C7971 Secondary malignant neoplasm of right adrenal gland: Secondary | ICD-10-CM | POA: Diagnosis not present

## 2021-11-21 DIAGNOSIS — Z5112 Encounter for antineoplastic immunotherapy: Secondary | ICD-10-CM | POA: Diagnosis not present

## 2021-11-21 DIAGNOSIS — Z87891 Personal history of nicotine dependence: Secondary | ICD-10-CM | POA: Insufficient documentation

## 2021-11-21 LAB — COMPREHENSIVE METABOLIC PANEL
ALT: 22 U/L (ref 0–44)
AST: 26 U/L (ref 15–41)
Albumin: 3.7 g/dL (ref 3.5–5.0)
Alkaline Phosphatase: 74 U/L (ref 38–126)
Anion gap: 6 (ref 5–15)
BUN: 27 mg/dL — ABNORMAL HIGH (ref 8–23)
CO2: 24 mmol/L (ref 22–32)
Calcium: 8.9 mg/dL (ref 8.9–10.3)
Chloride: 105 mmol/L (ref 98–111)
Creatinine, Ser: 1.08 mg/dL (ref 0.61–1.24)
GFR, Estimated: >60 mL/min (ref >60)
Glucose, Bld: 88 mg/dL (ref 70–99)
Potassium: 3.8 mmol/L (ref 3.5–5.1)
Sodium: 135 mmol/L (ref 135–145)
Total Bilirubin: 0.9 mg/dL (ref 0.3–1.2)
Total Protein: 6.9 g/dL (ref 6.5–8.1)

## 2021-11-21 LAB — CBC WITH DIFFERENTIAL/PLATELET
Abs Immature Granulocytes: 0.01 10*3/uL (ref 0.00–0.07)
Basophils Absolute: 0 10*3/uL (ref 0.0–0.1)
Basophils Relative: 0 %
Eosinophils Absolute: 0.1 10*3/uL (ref 0.0–0.5)
Eosinophils Relative: 2 %
HCT: 36.7 % — ABNORMAL LOW (ref 39.0–52.0)
Hemoglobin: 12.2 g/dL — ABNORMAL LOW (ref 13.0–17.0)
Immature Granulocytes: 0 %
Lymphocytes Relative: 45 %
Lymphs Abs: 2.6 10*3/uL (ref 0.7–4.0)
MCH: 29 pg (ref 26.0–34.0)
MCHC: 33.2 g/dL (ref 30.0–36.0)
MCV: 87.2 fL (ref 80.0–100.0)
Monocytes Absolute: 0.5 10*3/uL (ref 0.1–1.0)
Monocytes Relative: 8 %
Neutro Abs: 2.6 10*3/uL (ref 1.7–7.7)
Neutrophils Relative %: 45 %
Platelets: 177 10*3/uL (ref 150–400)
RBC: 4.21 MIL/uL — ABNORMAL LOW (ref 4.22–5.81)
RDW: 14.4 % (ref 11.5–15.5)
WBC: 5.7 10*3/uL (ref 4.0–10.5)
nRBC: 0 % (ref 0.0–0.2)

## 2021-11-21 LAB — TSH: TSH: 0.443 u[IU]/mL (ref 0.350–4.500)

## 2021-11-21 MED ORDER — SODIUM CHLORIDE 0.9 % IV SOLN
Freq: Once | INTRAVENOUS | Status: AC
Start: 2021-11-21 — End: 2021-11-21
  Filled 2021-11-21: qty 250

## 2021-11-21 MED ORDER — SODIUM CHLORIDE 0.9 % IV SOLN
200.0000 mg | Freq: Once | INTRAVENOUS | Status: AC
  Administered 2021-11-21: 200 mg via INTRAVENOUS
  Filled 2021-11-21: qty 8

## 2021-11-21 MED ORDER — METFORMIN HCL 500 MG PO TABS: 500.0000 mg | ORAL_TABLET | Freq: Two times a day (BID) | ORAL | 3 refills | Status: DC

## 2021-11-21 MED ORDER — HEPARIN SOD (PORK) LOCK FLUSH 100 UNIT/ML IV SOLN
INTRAVENOUS | Status: AC
  Administered 2021-11-21: 500 [IU]
  Filled 2021-11-21: qty 5

## 2021-11-21 MED ORDER — HEPARIN SOD (PORK) LOCK FLUSH 100 UNIT/ML IV SOLN
500.0000 [IU] | Freq: Once | INTRAVENOUS | Status: AC | PRN
  Filled 2021-11-21: qty 5

## 2021-11-21 NOTE — Patient Instructions (Signed)
[facility] [place]  Discharge Instructions: Thank you for choosing [facility] to provide your oncology and hematology care.  If you have a lab appointment with the [facility], please go directly to the [facility] and check in at the registration area.  Wear comfortable clothing and clothing appropriate for easy access to any Portacath or PICC line.   We strive to give you quality time with your provider. You may need to reschedule your appointment if you arrive late (15 or more minutes).  Arriving late affects you and other patients whose appointments are after yours.  Also, if you miss three or more appointments without notifying the office, you may be dismissed from the clinic at the provider's discretion.      For prescription refill requests, have your pharmacy contact our office and allow 72 hours for refills to be completed.    Today you received the following chemotherapy and/or immunotherapy agents Beryle Flock    To help prevent nausea and vomiting after your treatment, we encourage you to take your nausea medication as directed.  BELOW ARE SYMPTOMS THAT SHOULD BE REPORTED IMMEDIATELY: *FEVER GREATER THAN 100.4 F (38 C) OR HIGHER *CHILLS OR SWEATING *NAUSEA AND VOMITING THAT IS NOT CONTROLLED WITH YOUR NAUSEA MEDICATION *UNUSUAL SHORTNESS OF BREATH *UNUSUAL BRUISING OR BLEEDING *URINARY PROBLEMS (pain or burning when urinating, or frequent urination) *BOWEL PROBLEMS (unusual diarrhea, constipation, pain near the anus) TENDERNESS IN MOUTH AND THROAT WITH OR WITHOUT PRESENCE OF ULCERS (sore throat, sores in mouth, or a toothache) UNUSUAL RASH, SWELLING OR PAIN  UNUSUAL VAGINAL DISCHARGE OR ITCHING   Items with * indicate a potential emergency and should be followed up as soon as possible or go to the Emergency Department if any problems should occur.  Please show the CHEMOTHERAPY ALERT CARD or IMMUNOTHERAPY ALERT CARD at check-in to the  Emergency Department and triage nurse.  Should you have questions after your visit or need to cancel or reschedule your appointment, please contact [facility] [place] AT [place]  [phone] and follow the prompts.  Office hours are 8:00 a.m. to 4:30 p.m. Monday - Friday. Please note that voicemails left after 4:00 p.m. may not be returned until the following business day.  We are closed weekends and major holidays. You have access to a nurse at all times for urgent questions. Please call the main number to the clinic [phone] and follow the prompts.  For any non-urgent questions, you may also contact your provider using MyChart. We now offer e-Visits for anyone 6 and older to request care online for non-urgent symptoms. For details visit [email or web address].   Also download the MyChart app! Go to the app store, search "MyChart", open the app, select [facility], and log in with your MyChart username and password.  Due to Covid, a mask is required upon entering the hospital/clinic. If you do not have a mask, one will be given to you upon arrival. For doctor visits, patients may have 1 support person aged 10 or older with them. For treatment visits, patients cannot have anyone with them due to current Covid guidelines and our immunocompromised population.

## 2021-11-21 NOTE — Progress Notes (Signed)
[place] NOTE  Patient Care Team: Ezequiel Kayser, MD (Inactive) as PCP - General (Internal Medicine) Telford Nab, RN as Oncology Nurse Navigator  CHIEF COMPLAINTS/PURPOSE OF CONSULTATION: lung cancer  #  Oncology History Overview Note  # July 2021- RLL- 9.4 cm x 7.5 cm x 6.7 cm lobulated low-attenuation heterogeneous lung mass within the posteromedial aspect of the right lower lobe, at the level of the right hilum; positive for hilar; mediastinal adenopathy; PET scan-July 2021 + for adrenal metastases; CT-guided biopsy-non-small cell favor adenocarcinoma; necrosis  # carbo-alimtac  [name]; NGS pending] cycle #2 carbo Alimta [place]; 10/18/2020-maintenance Keytruda  # 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 CKD  # NGS/MOLECULAR TESTS: TPS =70%; rest **  # PALLIATIVE CARE EVALUATION:     DIAGNOSIS: Lung cancer  STAGE: 4       ;  GOALS: Palliative  CURRENT/MOST RECENT THERAPY : Carbo Alimta Keytruda    Cancer of lower lobe of right lung ([place])  07/05/2020 Initial Diagnosis   Cancer of lower lobe of right lung ([place])   07/26/2020 -  Chemotherapy   Patient is on Treatment Plan : LUNG CARBOplatin / Pemetrexed / Pembrolizumab q21d Induction x 4 cycles / Maintenance Pemetrexed + Pembrolizumab       HISTORY OF PRESENTING ILLNESS: Ambulating independently.  Accompanied by his wife.  Ralph Williams 80 y.o.  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  In the interim patient was evaluated with urology at [facility] for his renal mass.  Otherwise denies any headaches.  Any weight loss.  No new shortness of breath or cough.   Review of Systems  Constitutional:  Negative for chills, diaphoresis, fever, malaise/fatigue and weight loss.  HENT:  Negative for nosebleeds and sore throat.   Eyes:  Negative for  double vision.  Respiratory:  Negative for hemoptysis, sputum production and shortness of breath.   Cardiovascular:  Negative for chest pain, palpitations, orthopnea and leg swelling.  Gastrointestinal:  Negative for abdominal pain, blood in stool, constipation, diarrhea, heartburn, melena, nausea and vomiting.  Genitourinary:  Negative for dysuria, frequency and urgency.  Musculoskeletal:  Negative for back pain and joint pain.  Neurological:  Negative for dizziness, tingling, focal weakness, weakness and headaches.  Endo/Heme/Allergies:  Does not bruise/bleed easily.  Psychiatric/Behavioral:  Negative for depression. The patient is not nervous/anxious.     MEDICAL HISTORY:  Past Medical History:  Diagnosis Date   Chronic renal insufficiency    Diabetes mellitus without complication ([place])    Pt states it's diet controlled.   GERD (gastroesophageal reflux disease)    Hyperlipidemia associated with type 2 diabetes mellitus (HCC)    Hypertension    Lung mass    Non-small cell lung cancer, right ([place]) 06/2020    SURGICAL HISTORY: Past Surgical History:  Procedure Laterality Date   IR IMAGING GUIDED PORT INSERTION  07/22/2020   lymphoma removal     back    SOCIAL HISTORY: Social History   Socioeconomic History   Marital status: Married    Spouse name: Not on file   Number of children: Not on file   Years of education: Not on file   Highest education level: Not on file  Occupational History   Not on file  Tobacco Use   Smoking status: Former    Types: Cigarettes    Quit  date: 07/23/2007    Years since quitting: 14.3   Smokeless tobacco: Never  Vaping Use   Vaping Use: Never used  Substance and Sexual Activity   Alcohol use: Not Currently    Comment: none for 5 months   Drug use: Never   Sexual activity: Not on file  Other Topics Concern   Not on file  Social History Narrative   Moved from [place]; lives in [place] in 2021; daughter lives next door. Quit smoking-  13 years ago. Social alcohol. 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    Social Determinants of Health   Financial Resource Strain: Not on file  Food Insecurity: Not on file  Transportation Needs: Not on file  Physical Activity: Not on file  Stress: Not on file  Social Connections: Not on file  Intimate Partner Violence: Not on file    FAMILY HISTORY: Family History  Problem Relation Age of Onset   Liver cancer Sister    Cancer - Other Brother        bone cancer [2001]    ALLERGIES:  is allergic to niacin and related.  MEDICATIONS:  Current Outpatient Medications  Medication Sig Dispense Refill   acetaminophen (TYLENOL) 500 MG tablet Take 2 tablets by mouth every 8 (eight) hours as needed.     aspirin 325 MG EC tablet Take 1 tablet by mouth daily.     atenolol (TENORMIN) 25 MG tablet Take 1 tablet by mouth daily.     atorvastatin (LIPITOR) 40 MG tablet Take 1 tablet by mouth daily.     hydrochlorothiazide (HYDRODIURIL) 12.5 MG tablet Take 12.5 mg by mouth daily.     lidocaine-prilocaine (EMLA) cream Apply 1 application topically as needed. 30 g 4   losartan (COZAAR) 50 MG tablet Take 1 tablet by mouth daily.     Melatonin 10 MG TABS Take 1 tablet by mouth at bedtime.     Omega-3 1000 MG CAPS Take 1,000 mg by mouth in the morning and at bedtime.     omeprazole (PRILOSEC) 20 MG capsule Take 20 mg by mouth in the morning and at bedtime.     tadalafil (CIALIS) 20 MG tablet Take 1 tablet by mouth daily.     metFORMIN (GLUCOPHAGE) 500 MG tablet Take 1 tablet (500 mg total) by mouth 2 (two) times daily with a meal. 60 tablet 3   ondansetron (ZOFRAN) 8 MG tablet One pill every 8 hours as needed for nausea/vomitting. (Patient not taking: Reported on 10/07/2021) 40 tablet 1   prochlorperazine (COMPAZINE) 10 MG tablet Take 1 tablet (10 mg total) by mouth every 6 (six) hours as needed for nausea or vomiting. (Patient not taking: Reported on 10/07/2021) 40 tablet 1    No current facility-administered medications for this visit.   Facility-Administered Medications Ordered in Other Visits  Medication Dose Route Frequency Provider Last Rate Last Admin   heparin lock flush 100 UNIT/ML injection            heparin lock flush 100 unit/mL  500 Units Intracatheter Once PRN Cammie Sickle, MD       heparin lock flush 100 unit/mL  500 Units Intracatheter Once PRN Cammie Sickle, MD       prochlorperazine (COMPAZINE) tablet 10 mg  10 mg Oral Once Cammie Sickle, MD          .  PHYSICAL EXAMINATION: ECOG PERFORMANCE STATUS: 1 - Symptomatic but completely ambulatory  Vitals:   11/21/21 1304  BP: 119/68  Pulse: (!) 57  Resp: 16  Temp: (!) 96.8 F (36 C)   Filed Weights   11/21/21 1304  Weight: 237 lb 6.4 oz (107.7 kg)     Physical Exam Constitutional:      Comments: Ambulating independently.  Accompanied by his wife.   HENT:     Head: Normocephalic and atraumatic.     Mouth/Throat:     Pharynx: No oropharyngeal exudate.  Eyes:     Pupils: Pupils are equal, round, and reactive to light.  Cardiovascular:     Rate and Rhythm: Normal rate and regular rhythm.  Pulmonary:     Effort: Pulmonary effort is normal. No respiratory distress.     Breath sounds: Normal breath sounds. No wheezing.  Abdominal:     General: Bowel sounds are normal. There is no distension.     Palpations: Abdomen is soft. There is no mass.     Tenderness: There is no abdominal tenderness. There is no guarding or rebound.  Musculoskeletal:        General: No tenderness. Normal range of motion.     Cervical back: Normal range of motion and neck supple.  Skin:    General: Skin is warm.  Neurological:     Mental Status: He is alert and oriented to person, place, and time.  Psychiatric:        Mood and Affect: Affect normal.     LABORATORY DATA:  I have reviewed the data as listed Lab Results  Component Value Date   WBC 5.7 11/21/2021   HGB  12.2 (L) 11/21/2021   HCT 36.7 (L) 11/21/2021   MCV 87.2 11/21/2021   PLT 177 11/21/2021   Recent Labs    10/07/21 1229 10/28/21 0948 11/21/21 1228  NA 135 135 135  K 4.1 4.2 3.8  CL 102 101 105  CO2 27 26 24   GLUCOSE 98 94 88  BUN 30* 26* 27*  CREATININE 1.44* 1.44* 1.08  CALCIUM 8.6* 8.6* 8.9  GFRNONAA 50* 50* >60  PROT 7.1 6.8 6.9  ALBUMIN 3.7 3.8 3.7  AST 20 19 26   ALT 17 18 22   ALKPHOS 85 78 74  BILITOT 0.7 0.5 0.9    RADIOGRAPHIC STUDIES: I have personally reviewed the radiological images as listed and agreed with the findings in the report. CT CHEST WO CONTRAST  Result Date: 10/25/2021 CLINICAL DATA:  Non-small-cell lung cancer.  Restaging. EXAM: CT CHEST WITHOUT CONTRAST TECHNIQUE: Multidetector CT imaging of the chest was performed following the standard protocol without IV contrast. COMPARISON:  06/20/2021 FINDINGS: Cardiovascular: The heart size is normal. No substantial pericardial effusion. Coronary artery calcification is evident. Mild atherosclerotic calcification is noted in the wall of the thoracic aorta. Right Port-A-Cath tip is positioned in the distal SVC. Mediastinum/Nodes: No mediastinal lymphadenopathy. No evidence for gross hilar lymphadenopathy although assessment is limited by the lack of intravenous contrast on the current study. The esophagus has normal imaging features. There is no axillary lymphadenopathy. 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 Small focus of scarring with calcification in the anterior left upper lobe is unchanged. There is no new suspicious pulmonary nodule or mass. No pleural effusion. Upper Abdomen: Stable exophytic water density lesion upper pole left kidney compatible with a cyst. 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 Musculoskeletal: No worrisome lytic or sclerotic osseous abnormality. IMPRESSION: 1. Stable exam. No new or progressive findings to suggest recurrent or metastatic disease. 2. Stable appearance of post radiation fibrosis in the retro hilar right lower lobe. 3. 19 mm exophytic lesion upper pole left kidney characterized as solid enhancing neoplasm on MRI of 01/22/2021. 4. Aortic Atherosclerosis (ICD10-I70.0). Electronically Signed   By: Misty Stanley M.D.   On: 10/25/2021 13:29    ASSESSMENT & PLAN:   Cancer of lower lobe of right lung ([place]) # Lung cancer-non-small cell favor adeno ca; Stage IV;NOV 5th, 2022- Stable exam. No new or progressive findings to suggest recurrent or metastatic disease; Stable- 19 mm exophytic lesion upper pole left kidney characterized as solid enhancing neoplasm on MRI of 01/22/2021.  # proceed with Keytruda maintenance Labs today reviewed;  acceptable for treatment today.   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   # DM-2;-FBG- 94; MAY 2022-Hb1a C-6.5. On Metformin 500 mg BID;refilled;  monitor now.  STABLE.   # DISPOSITION: later appt in AM # Keytruda today # follow up in 3 weeks- NP; ;labs- cbc/cmp; Keytruda # follow up in  6 weekMD; labs- cbc/cmp;TSH- Keytruda--Dr.B     All questions were answered. The patient knows to call the clinic with any problems, questions or concerns.    Cammie Sickle, MD 11/21/2021 2:42 PM

## 2021-11-21 NOTE — Progress Notes (Signed)
Patient denies new problems/concerns today.   °

## 2021-11-21 NOTE — Assessment & Plan Note (Addendum)
#   Lung cancer-non-small cell favor adeno ca; Stage IV;NOV 5th, 2022- Stable exam. No new or progressive findings to suggest recurrent or metastatic disease; Stable- 19 mm exophytic lesion upper pole left kidney characterized as solid enhancing neoplasm on MRI of 01/22/2021.  # proceed with Keytruda maintenance Labs today reviewed;  acceptable for treatment today.   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   # DM-2;-FBG- 94; MAY 2022-Hb1a C-6.5. On Metformin 500 mg BID;refilled;  monitor now.  STABLE.   # DISPOSITION: later appt in AM # Keytruda today # follow up in 3 weeks- NP; ;labs- cbc/cmp; Keytruda # follow up in  6 weekMD; labs- cbc/cmp;TSH- Keytruda--Dr.B

## 2021-12-04 ENCOUNTER — Other Ambulatory Visit: Payer: Self-pay | Admitting: Internal Medicine

## 2021-12-06 ENCOUNTER — Other Ambulatory Visit: Payer: Self-pay

## 2021-12-12 ENCOUNTER — Inpatient Hospital Stay: Payer: Medicare PPO | Admitting: Oncology

## 2021-12-12 ENCOUNTER — Inpatient Hospital Stay: Payer: Medicare PPO

## 2021-12-12 ENCOUNTER — Other Ambulatory Visit: Payer: Self-pay

## 2021-12-12 VITALS — BP 143/71 | HR 59 | Temp 97.0°F | Resp 17 | Wt 240.4 lb

## 2021-12-12 DIAGNOSIS — C642 Malignant neoplasm of left kidney, except renal pelvis: Secondary | ICD-10-CM

## 2021-12-12 DIAGNOSIS — C3431 Malignant neoplasm of lower lobe, right bronchus or lung: Secondary | ICD-10-CM

## 2021-12-12 DIAGNOSIS — Z5112 Encounter for antineoplastic immunotherapy: Secondary | ICD-10-CM | POA: Diagnosis not present

## 2021-12-12 LAB — COMPREHENSIVE METABOLIC PANEL
ALT: 24 U/L (ref 0–44)
AST: 24 U/L (ref 15–41)
Albumin: 3.9 g/dL (ref 3.5–5.0)
Alkaline Phosphatase: 80 U/L (ref 38–126)
Anion gap: 10 (ref 5–15)
BUN: 26 mg/dL — ABNORMAL HIGH (ref 8–23)
CO2: 24 mmol/L (ref 22–32)
Calcium: 8.7 mg/dL — ABNORMAL LOW (ref 8.9–10.3)
Chloride: 100 mmol/L (ref 98–111)
Creatinine, Ser: 1.21 mg/dL (ref 0.61–1.24)
GFR, Estimated: >60 mL/min (ref >60)
Glucose, Bld: 115 mg/dL — ABNORMAL HIGH (ref 70–99)
Potassium: 3.9 mmol/L (ref 3.5–5.1)
Sodium: 134 mmol/L — ABNORMAL LOW (ref 135–145)
Total Bilirubin: 0.5 mg/dL (ref 0.3–1.2)
Total Protein: 7.1 g/dL (ref 6.5–8.1)

## 2021-12-12 LAB — CBC WITH DIFFERENTIAL/PLATELET
Abs Immature Granulocytes: 0.01 10*3/uL (ref 0.00–0.07)
Basophils Absolute: 0 10*3/uL (ref 0.0–0.1)
Basophils Relative: 0 %
Eosinophils Absolute: 0.1 10*3/uL (ref 0.0–0.5)
Eosinophils Relative: 1 %
HCT: 37.6 % — ABNORMAL LOW (ref 39.0–52.0)
Hemoglobin: 12.4 g/dL — ABNORMAL LOW (ref 13.0–17.0)
Immature Granulocytes: 0 %
Lymphocytes Relative: 47 %
Lymphs Abs: 2.2 10*3/uL (ref 0.7–4.0)
MCH: 28.8 pg (ref 26.0–34.0)
MCHC: 33 g/dL (ref 30.0–36.0)
MCV: 87.2 fL (ref 80.0–100.0)
Monocytes Absolute: 0.4 10*3/uL (ref 0.1–1.0)
Monocytes Relative: 9 %
Neutro Abs: 2.1 10*3/uL (ref 1.7–7.7)
Neutrophils Relative %: 43 %
Platelets: 168 10*3/uL (ref 150–400)
RBC: 4.31 MIL/uL (ref 4.22–5.81)
RDW: 14.4 % (ref 11.5–15.5)
WBC: 4.8 10*3/uL (ref 4.0–10.5)
nRBC: 0 % (ref 0.0–0.2)

## 2021-12-12 MED ORDER — SODIUM CHLORIDE 0.9 % IV SOLN
200.0000 mg | Freq: Once | INTRAVENOUS | Status: AC
  Administered 2021-12-12: 10:00:00 200 mg via INTRAVENOUS
  Filled 2021-12-12: qty 8

## 2021-12-12 MED ORDER — HEPARIN SOD (PORK) LOCK FLUSH 100 UNIT/ML IV SOLN
500.0000 [IU] | Freq: Once | INTRAVENOUS | Status: AC | PRN
  Filled 2021-12-12: qty 5

## 2021-12-12 MED ORDER — HEPARIN SOD (PORK) LOCK FLUSH 100 UNIT/ML IV SOLN
INTRAVENOUS | Status: AC
  Administered 2021-12-12: 10:00:00 500 [IU]
  Filled 2021-12-12: qty 5

## 2021-12-12 MED ORDER — SODIUM CHLORIDE 0.9 % IV SOLN
Freq: Once | INTRAVENOUS | Status: AC
  Filled 2021-12-12: qty 250

## 2021-12-12 NOTE — Progress Notes (Signed)
Feeling very well today. Denies Pain. Appetite and energy are good.

## 2021-12-12 NOTE — Patient Instructions (Signed)
[facility] [place]  Discharge Instructions: Thank you for choosing [facility] to provide your oncology and hematology care.  If you have a lab appointment with the [facility], please go directly to the [facility] and check in at the registration area.  Wear comfortable clothing and clothing appropriate for easy access to any Portacath or PICC line.   We strive to give you quality time with your provider. You may need to reschedule your appointment if you arrive late (15 or more minutes).  Arriving late affects you and other patients whose appointments are after yours.  Also, if you miss three or more appointments without notifying the office, you may be dismissed from the clinic at the providers discretion.      For prescription refill requests, have your pharmacy contact our office and allow 72 hours for refills to be completed.    Today you received the following chemotherapy and/or immunotherapy agents Beryle Flock   To help prevent nausea and vomiting after your treatment, we encourage you to take your nausea medication as directed.  BELOW ARE SYMPTOMS THAT SHOULD BE REPORTED IMMEDIATELY: *FEVER GREATER THAN 100.4 F (38 C) OR HIGHER *CHILLS OR SWEATING *NAUSEA AND VOMITING THAT IS NOT CONTROLLED WITH YOUR NAUSEA MEDICATION *UNUSUAL SHORTNESS OF BREATH *UNUSUAL BRUISING OR BLEEDING *URINARY PROBLEMS (pain or burning when urinating, or frequent urination) *BOWEL PROBLEMS (unusual diarrhea, constipation, pain near the anus) TENDERNESS IN MOUTH AND THROAT WITH OR WITHOUT PRESENCE OF ULCERS (sore throat, sores in mouth, or a toothache) UNUSUAL RASH, SWELLING OR PAIN  UNUSUAL VAGINAL DISCHARGE OR ITCHING   Items with * indicate a potential emergency and should be followed up as soon as possible or go to the Emergency Department if any problems should occur.  Please show the CHEMOTHERAPY ALERT CARD or IMMUNOTHERAPY ALERT CARD at check-in to the  Emergency Department and triage nurse.  Should you have questions after your visit or need to cancel or reschedule your appointment, please contact [facility] [place] [facility] [place]  [phone] and follow the prompts.  Office hours are 8:00 a.m. to 4:30 p.m. Monday - Friday. Please note that voicemails left after 4:00 p.m. may not be returned until the following business day.  We are closed weekends and major holidays. You have access to a nurse at all times for urgent questions. Please call the main number to the clinic [phone] and follow the prompts.  For any non-urgent questions, you may also contact your provider using MyChart. We now offer e-Visits for anyone 28 and older to request care online for non-urgent symptoms. For details visit [email or web address].   Also download the MyChart app! Go to the app store, search "MyChart", open the app, select [facility], and log in with your MyChart username and password.  Due to Covid, a mask is required upon entering the hospital/clinic. If you do not have a mask, one will be given to you upon arrival. For doctor visits, patients may have 1 support person aged 23 or older with them. For treatment visits, patients cannot have anyone with them due to current Covid guidelines and our immunocompromised population.

## 2021-12-12 NOTE — Progress Notes (Signed)
[name] NOTE  Patient Care Team: Ezequiel Kayser, MD (Inactive) as PCP - General (Internal Medicine) Telford Nab, RN as Oncology Nurse Navigator  CHIEF COMPLAINTS/PURPOSE OF CONSULTATION: lung cancer  #  Oncology History Overview Note  # July 2021- RLL- 9.4 cm x 7.5 cm x 6.7 cm lobulated low-attenuation heterogeneous lung mass within the posteromedial aspect of the right lower lobe, at the level of the right hilum; positive for hilar; mediastinal adenopathy; PET scan-July 2021 + for adrenal metastases; CT-guided biopsy-non-small cell favor adenocarcinoma; necrosis  # carbo-alimtac  [name]; NGS pending] cycle #2 carbo Alimta [place]; 10/18/2020-maintenance Keytruda  # 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 CKD  # NGS/MOLECULAR TESTS: TPS =70%; rest **  # PALLIATIVE CARE EVALUATION:     DIAGNOSIS: Lung cancer  STAGE: 4       ;  GOALS: Palliative  CURRENT/MOST RECENT THERAPY : Carbo Alimta Keytruda    Cancer of lower lobe of right lung ([place])  07/05/2020 Initial Diagnosis   Cancer of lower lobe of right lung ([place])   07/26/2020 -  Chemotherapy   Patient is on Treatment Plan : LUNG CARBOplatin / Pemetrexed / Pembrolizumab q21d Induction x 4 cycles / Maintenance Pemetrexed + Pembrolizumab       HISTORY OF PRESENTING ILLNESS: [name] is a 80 year old male with above history who is followed by Dr. 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  He is currently on maintenance Keytruda.  In the interim he has done well.  He denies any new concerns.   Review of Systems  Constitutional: Negative.  Negative for chills, fever, malaise/fatigue and weight loss.  HENT:  Negative for congestion, ear pain and tinnitus.   Eyes: Negative.  Negative for blurred vision and double vision.  Respiratory: Negative.  Negative for cough, sputum production and  shortness of breath.   Cardiovascular: Negative.  Negative for chest pain, palpitations and leg swelling.  Gastrointestinal: Negative.  Negative for abdominal pain, constipation, diarrhea, nausea and vomiting.  Genitourinary:  Negative for dysuria, frequency and urgency.  Musculoskeletal:  Negative for back pain and falls.  Skin: Negative.  Negative for rash.  Neurological: Negative.  Negative for weakness and headaches.  Endo/Heme/Allergies: Negative.  Does not bruise/bleed easily.  Psychiatric/Behavioral: Negative.  Negative for depression. The patient is not nervous/anxious and does not have insomnia.     MEDICAL HISTORY:  Past Medical History:  Diagnosis Date   Chronic renal insufficiency    Diabetes mellitus without complication ([place])    Pt states it's diet controlled.   GERD (gastroesophageal reflux disease)    Hyperlipidemia associated with type 2 diabetes mellitus ([facility])    Hypertension    Lung mass    Non-small cell lung cancer, right ([place]) 06/2020    SURGICAL HISTORY: Past Surgical History:  Procedure Laterality Date   IR IMAGING GUIDED PORT INSERTION  07/22/2020   lymphoma removal     back    SOCIAL HISTORY: Social History   Socioeconomic History   Marital status: Married    Spouse name: Not on file   Number of children: Not on file   Years of education: Not on file   Highest education level: Not on file  Occupational History   Not on file  Tobacco Use   Smoking status: Former    Types: Cigarettes    Quit date: 07/23/2007    Years  since quitting: 14.4   Smokeless tobacco: Never  Vaping Use   Vaping Use: Never used  Substance and Sexual Activity   Alcohol use: Not Currently    Comment: none for 5 months   Drug use: Never   Sexual activity: Not on file  Other Topics Concern   Not on file  Social History Narrative   Moved from [place]; lives in [place] in 2021; daughter lives next door. Quit smoking- 13 years ago. Social alcohol. Retd.  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    Social Determinants of Health   Financial Resource Strain: Not on file  Food Insecurity: Not on file  Transportation Needs: Not on file  Physical Activity: Not on file  Stress: Not on file  Social Connections: Not on file  Intimate Partner Violence: Not on file    FAMILY HISTORY: Family History  Problem Relation Age of Onset   Liver cancer Sister    Cancer - Other Brother        bone cancer [2001]    ALLERGIES:  is allergic to niacin and related.  MEDICATIONS:  Current Outpatient Medications  Medication Sig Dispense Refill   acetaminophen (TYLENOL) 500 MG tablet Take 2 tablets by mouth every 8 (eight) hours as needed.     aspirin 325 MG EC tablet Take 1 tablet by mouth daily.     atenolol (TENORMIN) 25 MG tablet Take 1 tablet by mouth daily.     atorvastatin (LIPITOR) 40 MG tablet Take 1 tablet by mouth daily.     hydrochlorothiazide (HYDRODIURIL) 12.5 MG tablet Take 12.5 mg by mouth daily.     lidocaine-prilocaine (EMLA) cream Apply 1 application topically as needed. 30 g 4   losartan (COZAAR) 50 MG tablet Take 1 tablet by mouth daily.     Melatonin 10 MG TABS Take 1 tablet by mouth at bedtime.     metFORMIN (GLUCOPHAGE) 500 MG tablet TAKE ONE TABLET BY MOUTH TWICE A DAY WITH A MEAL 60 tablet 3   Omega-3 1000 MG CAPS Take 1,000 mg by mouth in the morning and at bedtime.     omeprazole (PRILOSEC) 20 MG capsule Take 20 mg by mouth in the morning and at bedtime.     tadalafil (CIALIS) 20 MG tablet Take 1 tablet by mouth daily.     ondansetron (ZOFRAN) 8 MG tablet One pill every 8 hours as needed for nausea/vomitting. (Patient not taking: Reported on 10/07/2021) 40 tablet 1   prochlorperazine (COMPAZINE) 10 MG tablet Take 1 tablet (10 mg total) by mouth every 6 (six) hours as needed for nausea or vomiting. (Patient not taking: Reported on 10/07/2021) 40 tablet 1   No current facility-administered medications for this  visit.   Facility-Administered Medications Ordered in Other Visits  Medication Dose Route Frequency Provider Last Rate Last Admin   heparin lock flush 100 UNIT/ML injection            heparin lock flush 100 unit/mL  500 Units Intracatheter Once PRN Charlaine Dalton R, MD       heparin lock flush 100 unit/mL  500 Units Intracatheter Once PRN Cammie Sickle, MD       pembrolizumab [facility]) 200 mg in sodium chloride 0.9 % 50 mL chemo infusion  200 mg Intravenous Once Cammie Sickle, MD 116 mL/hr at 12/12/21 0941 200 mg at 12/12/21 0941   prochlorperazine (COMPAZINE) tablet 10 mg  10 mg Oral Once Cammie Sickle, MD          .  PHYSICAL EXAMINATION: ECOG PERFORMANCE STATUS: 1 - Symptomatic but completely ambulatory  Vitals:   12/12/21 0902  BP: (!) 143/71  Pulse: (!) 59  Resp: 17  Temp: (!) 97 F (36.1 C)  SpO2: 99%   Filed Weights   12/12/21 0902  Weight: 240 lb 6.4 oz (109 kg)     Physical Exam Constitutional:      Appearance: Normal appearance. He is obese.  HENT:     Head: Normocephalic and atraumatic.  Eyes:     Pupils: Pupils are equal, round, and reactive to light.  Cardiovascular:     Rate and Rhythm: Normal rate and regular rhythm.     Heart sounds: Normal heart sounds. No murmur heard. Pulmonary:     Effort: Pulmonary effort is normal.     Breath sounds: Normal breath sounds. No wheezing.  Abdominal:     General: Bowel sounds are normal. There is no distension.     Palpations: Abdomen is soft.     Tenderness: There is no abdominal tenderness.  Musculoskeletal:        General: Normal range of motion.     Cervical back: Normal range of motion.  Skin:    General: Skin is warm and dry.     Findings: No rash.  Neurological:     Mental Status: He is alert and oriented to person, place, and time.     Gait: Gait is intact.  Psychiatric:        Mood and Affect: Mood and affect normal.        Cognition and Memory: Memory normal.         Judgment: Judgment normal.     LABORATORY DATA:  I have reviewed the data as listed Lab Results  Component Value Date   WBC 4.8 12/12/2021   HGB 12.4 (L) 12/12/2021   HCT 37.6 (L) 12/12/2021   MCV 87.2 12/12/2021   PLT 168 12/12/2021   Recent Labs    10/28/21 0948 11/21/21 1228 12/12/21 0844  NA 135 135 134*  K 4.2 3.8 3.9  CL 101 105 100  CO2 26 24 24   GLUCOSE 94 88 115*  BUN 26* 27* 26*  CREATININE 1.44* 1.08 1.21  CALCIUM 8.6* 8.9 8.7*  GFRNONAA 50* >60 >60  PROT 6.8 6.9 7.1  ALBUMIN 3.8 3.7 3.9  AST 19 26 24   ALT 18 22 24   ALKPHOS 78 74 80  BILITOT 0.5 0.9 0.5    RADIOGRAPHIC STUDIES: I have personally reviewed the radiological images as listed and agreed with the findings in the report. No results found.  ASSESSMENT & PLAN: [name] is a 80 year old male with stage IV non-small cell adeno metastatic to adrenals currently on Keytruda.  Appears to be tolerating treatment well.  Reviewed labs which are acceptable for treatment.  Proceed with next cycle of Keytruda.  Return to clinic as scheduled to see Dr. Rogue Bussing prior to next cycle of Keytruda.  I spent 15 minutes dedicated to the care of this patient (face-to-face and non-face-to-face) on the date of the encounter to include what is described in the assessment and plan.  No problem-specific Assessment & Plan notes found for this encounter.  All questions were answered. The patient knows to call the clinic with any problems, questions or concerns.    Jacquelin Hawking, NP 12/12/2021 9:42 AM

## 2022-01-02 ENCOUNTER — Other Ambulatory Visit: Payer: Self-pay

## 2022-01-02 ENCOUNTER — Inpatient Hospital Stay: Payer: Medicare PPO | Attending: Oncology

## 2022-01-02 ENCOUNTER — Inpatient Hospital Stay: Payer: Medicare PPO

## 2022-01-02 ENCOUNTER — Encounter: Payer: Self-pay | Admitting: Internal Medicine

## 2022-01-02 ENCOUNTER — Inpatient Hospital Stay: Payer: Medicare PPO | Admitting: Internal Medicine

## 2022-01-02 DIAGNOSIS — C7970 Secondary malignant neoplasm of unspecified adrenal gland: Secondary | ICD-10-CM | POA: Insufficient documentation

## 2022-01-02 DIAGNOSIS — C3431 Malignant neoplasm of lower lobe, right bronchus or lung: Secondary | ICD-10-CM

## 2022-01-02 DIAGNOSIS — Z800 Family history of malignant neoplasm of digestive organs: Secondary | ICD-10-CM | POA: Diagnosis not present

## 2022-01-02 DIAGNOSIS — Z5112 Encounter for antineoplastic immunotherapy: Secondary | ICD-10-CM | POA: Insufficient documentation

## 2022-01-02 DIAGNOSIS — E1122 Type 2 diabetes mellitus with diabetic chronic kidney disease: Secondary | ICD-10-CM | POA: Diagnosis not present

## 2022-01-02 DIAGNOSIS — C642 Malignant neoplasm of left kidney, except renal pelvis: Secondary | ICD-10-CM | POA: Diagnosis not present

## 2022-01-02 DIAGNOSIS — N183 Chronic kidney disease, stage 3 unspecified: Secondary | ICD-10-CM | POA: Insufficient documentation

## 2022-01-02 DIAGNOSIS — Z808 Family history of malignant neoplasm of other organs or systems: Secondary | ICD-10-CM | POA: Insufficient documentation

## 2022-01-02 DIAGNOSIS — D649 Anemia, unspecified: Secondary | ICD-10-CM | POA: Insufficient documentation

## 2022-01-02 DIAGNOSIS — Z7984 Long term (current) use of oral hypoglycemic drugs: Secondary | ICD-10-CM | POA: Insufficient documentation

## 2022-01-02 DIAGNOSIS — Z87891 Personal history of nicotine dependence: Secondary | ICD-10-CM | POA: Diagnosis not present

## 2022-01-02 LAB — CBC WITH DIFFERENTIAL/PLATELET
Abs Immature Granulocytes: 0.01 10*3/uL (ref 0.00–0.07)
Basophils Absolute: 0 10*3/uL (ref 0.0–0.1)
Basophils Relative: 0 %
Eosinophils Absolute: 0.1 10*3/uL (ref 0.0–0.5)
Eosinophils Relative: 1 %
HCT: 37 % — ABNORMAL LOW (ref 39.0–52.0)
Hemoglobin: 12.1 g/dL — ABNORMAL LOW (ref 13.0–17.0)
Immature Granulocytes: 0 %
Lymphocytes Relative: 48 %
Lymphs Abs: 2.8 10*3/uL (ref 0.7–4.0)
MCH: 28.3 pg (ref 26.0–34.0)
MCHC: 32.7 g/dL (ref 30.0–36.0)
MCV: 86.7 fL (ref 80.0–100.0)
Monocytes Absolute: 0.5 10*3/uL (ref 0.1–1.0)
Monocytes Relative: 9 %
Neutro Abs: 2.5 10*3/uL (ref 1.7–7.7)
Neutrophils Relative %: 42 %
Platelets: 186 10*3/uL (ref 150–400)
RBC: 4.27 MIL/uL (ref 4.22–5.81)
RDW: 14.5 % (ref 11.5–15.5)
WBC: 5.9 10*3/uL (ref 4.0–10.5)
nRBC: 0 % (ref 0.0–0.2)

## 2022-01-02 LAB — COMPREHENSIVE METABOLIC PANEL
ALT: 20 U/L (ref 0–44)
AST: 22 U/L (ref 15–41)
Albumin: 4 g/dL (ref 3.5–5.0)
Alkaline Phosphatase: 82 U/L (ref 38–126)
Anion gap: 8 (ref 5–15)
BUN: 33 mg/dL — ABNORMAL HIGH (ref 8–23)
CO2: 22 mmol/L (ref 22–32)
Calcium: 8.5 mg/dL — ABNORMAL LOW (ref 8.9–10.3)
Chloride: 102 mmol/L (ref 98–111)
Creatinine, Ser: 1.38 mg/dL — ABNORMAL HIGH (ref 0.61–1.24)
GFR, Estimated: 52 mL/min — ABNORMAL LOW (ref >60)
Glucose, Bld: 89 mg/dL (ref 70–99)
Potassium: 3.8 mmol/L (ref 3.5–5.1)
Sodium: 132 mmol/L — ABNORMAL LOW (ref 135–145)
Total Bilirubin: 0.4 mg/dL (ref 0.3–1.2)
Total Protein: 7 g/dL (ref 6.5–8.1)

## 2022-01-02 LAB — IRON AND TIBC
Iron: 55 ug/dL (ref 45–182)
Saturation Ratios: 12 % — ABNORMAL LOW (ref 17.9–39.5)
TIBC: 461 ug/dL — ABNORMAL HIGH (ref 250–450)
UIBC: 406 ug/dL

## 2022-01-02 LAB — TSH: TSH: 0.818 u[IU]/mL (ref 0.350–4.500)

## 2022-01-02 LAB — FERRITIN: Ferritin: 12 ng/mL — ABNORMAL LOW (ref 24–336)

## 2022-01-02 MED ORDER — SODIUM CHLORIDE 0.9 % IV SOLN
Freq: Once | INTRAVENOUS | Status: AC
  Filled 2022-01-02: qty 250

## 2022-01-02 MED ORDER — SODIUM CHLORIDE 0.9 % IV SOLN
200.0000 mg | Freq: Once | INTRAVENOUS | Status: AC
  Administered 2022-01-02: 200 mg via INTRAVENOUS
  Filled 2022-01-02: qty 8

## 2022-01-02 MED ORDER — SODIUM CHLORIDE 0.9% FLUSH
10.0000 mL | INTRAVENOUS | Status: DC | PRN
  Administered 2022-01-02: 10 mL
  Filled 2022-01-02: qty 10

## 2022-01-02 MED ORDER — HEPARIN SOD (PORK) LOCK FLUSH 100 UNIT/ML IV SOLN
500.0000 [IU] | Freq: Once | INTRAVENOUS | Status: AC | PRN
  Administered 2022-01-02: 500 [IU]
  Filled 2022-01-02: qty 5

## 2022-01-02 MED ORDER — PROCHLORPERAZINE MALEATE 10 MG PO TABS: 10.0000 mg | ORAL_TABLET | Freq: Once | ORAL | Status: DC

## 2022-01-02 NOTE — Patient Instructions (Signed)
[facility] AT [place]  Discharge Instructions: Thank you for choosing [facility] to provide your oncology and hematology care.  If you have a lab appointment with the [facility], please go directly to the [facility] and check in at the registration area.  Wear comfortable clothing and clothing appropriate for easy access to any Portacath or PICC line.   We strive to give you quality time with your provider. You may need to reschedule your appointment if you arrive late (15 or more minutes).  Arriving late affects you and other patients whose appointments are after yours.  Also, if you miss three or more appointments without notifying the office, you may be dismissed from the clinic at the providers discretion.      For prescription refill requests, have your pharmacy contact our office and allow 72 hours for refills to be completed.    Today you received the following chemotherapy and/or immunotherapy agents KEYTRUDA      To help prevent nausea and vomiting after your treatment, we encourage you to take your nausea medication as directed.  BELOW ARE SYMPTOMS THAT SHOULD BE REPORTED IMMEDIATELY: *FEVER GREATER THAN 100.4 F (38 C) OR HIGHER *CHILLS OR SWEATING *NAUSEA AND VOMITING THAT IS NOT CONTROLLED WITH YOUR NAUSEA MEDICATION *UNUSUAL SHORTNESS OF BREATH *UNUSUAL BRUISING OR BLEEDING *URINARY PROBLEMS (pain or burning when urinating, or frequent urination) *BOWEL PROBLEMS (unusual diarrhea, constipation, pain near the anus) TENDERNESS IN MOUTH AND THROAT WITH OR WITHOUT PRESENCE OF ULCERS (sore throat, sores in mouth, or a toothache) UNUSUAL RASH, SWELLING OR PAIN  UNUSUAL VAGINAL DISCHARGE OR ITCHING   Items with * indicate a potential emergency and should be followed up as soon as possible or go to the Emergency Department if any problems should occur.  Please show the CHEMOTHERAPY ALERT CARD or IMMUNOTHERAPY ALERT CARD at check-in to  the Emergency Department and triage nurse.  Should you have questions after your visit or need to cancel or reschedule your appointment, please contact [facility] [place] AT [place]  [phone] and follow the prompts.  Office hours are 8:00 a.m. to 4:30 p.m. Monday - Friday. Please note that voicemails left after 4:00 p.m. may not be returned until the following business day.  We are closed weekends and major holidays. You have access to a nurse at all times for urgent questions. Please call the main number to the clinic [phone] and follow the prompts.  For any non-urgent questions, you may also contact your provider using MyChart. We now offer e-Visits for anyone 26 and older to request care online for non-urgent symptoms. For details visit [email or web address].   Also download the MyChart app! Go to the app store, search "MyChart", open the app, select [facility], and log in with your MyChart username and password.  Due to Covid, a mask is required upon entering the hospital/clinic. If you do not have a mask, one will be given to you upon arrival. For doctor visits, patients may have 1 support person aged 75 or older with them. For treatment visits, patients cannot have anyone with them due to current Covid guidelines and our immunocompromised population.   Pembrolizumab injection What is this medication? PEMBROLIZUMAB (pem broe liz ue mab) is a monoclonal antibody. It is used to treat certain types of cancer. This medicine may be used for other purposes; ask your health care provider or pharmacist if you have questions. COMMON BRAND NAME(S): Keytruda What should I tell my care team  before I take this medication? They need to know if you have any of these conditions: autoimmune diseases like Crohn's disease, ulcerative colitis, or lupus have had or planning to have an allogeneic stem cell transplant (uses someone else's stem cells) history of organ transplant history  of chest radiation nervous system problems like myasthenia gravis or Guillain-Barre syndrome an unusual or allergic reaction to pembrolizumab, other medicines, foods, dyes, or preservatives pregnant or trying to get pregnant breast-feeding How should I use this medication? This medicine is for infusion into a vein. It is given by a health care professional in a hospital or clinic setting. A special MedGuide will be given to you before each treatment. Be sure to read this information carefully each time. Talk to your pediatrician regarding the use of this medicine in children. While this drug may be prescribed for children as young as 6 months for selected conditions, precautions do apply. Overdosage: If you think you have taken too much of this medicine contact a poison control center or emergency room at once. NOTE: This medicine is only for you. Do not share this medicine with others. What if I miss a dose? It is important not to miss your dose. Call your doctor or health care professional if you are unable to keep an appointment. What may interact with this medication? Interactions have not been studied. This list may not describe all possible interactions. Give your health care provider a list of all the medicines, herbs, non-prescription drugs, or dietary supplements you use. Also tell them if you smoke, drink alcohol, or use illegal drugs. Some items may interact with your medicine. What should I watch for while using this medication? Your condition will be monitored carefully while you are receiving this medicine. You may need blood work done while you are taking this medicine. Do not become pregnant while taking this medicine or for 4 months after stopping it. Women should inform their doctor if they wish to become pregnant or think they might be pregnant. There is a potential for serious side effects to an unborn child. Talk to your health care professional or pharmacist for more  information. Do not breast-feed an infant while taking this medicine or for 4 months after the last dose. What side effects may I notice from receiving this medication? Side effects that you should report to your doctor or health care professional as soon as possible: allergic reactions like skin rash, itching or hives, swelling of the face, lips, or tongue bloody or black, tarry breathing problems changes in vision chest pain chills confusion constipation cough diarrhea dizziness or feeling faint or lightheaded fast or irregular heartbeat fever flushing joint pain low blood counts - this medicine may decrease the number of white blood cells, red blood cells and platelets. You may be at increased risk for infections and bleeding. muscle pain muscle weakness pain, tingling, numbness in the hands or feet persistent headache redness, blistering, peeling or loosening of the skin, including inside the mouth signs and symptoms of high blood sugar such as dizziness; dry mouth; dry skin; fruity breath; nausea; stomach pain; increased hunger or thirst; increased urination signs and symptoms of kidney injury like trouble passing urine or change in the amount of urine signs and symptoms of liver injury like dark urine, light-colored stools, loss of appetite, nausea, right upper belly pain, yellowing of the eyes or skin sweating swollen lymph nodes weight loss Side effects that usually do not require medical attention (report to your doctor or  health care professional if they continue or are bothersome): decreased appetite hair loss tiredness This list may not describe all possible side effects. Call your doctor for medical advice about side effects. You may report side effects to FDA at 1-800-FDA-1088. Where should I keep my medication? This drug is given in a hospital or clinic and will not be stored at home. NOTE: This sheet is a summary. It may not cover all possible information. If you  have questions about this medicine, talk to your doctor, pharmacist, or health care provider.  2022 Elsevier/Gold Standard (2021-08-26 00:00:00)

## 2022-01-02 NOTE — Progress Notes (Signed)
Patient denies new problems/concerns today.   °

## 2022-01-02 NOTE — Patient Instructions (Signed)
#  Recommend gentle iron 1 pill every other day for the first week or so; if no problems recommend taking iron pill every day

## 2022-01-02 NOTE — Progress Notes (Signed)
[facility] NOTE  Patient Care Team: Cox, Okey Regal, MD as PCP - General (Family Medicine) Telford Nab, RN as Oncology Nurse Navigator  CHIEF COMPLAINTS/PURPOSE OF CONSULTATION: lung cancer  #  Oncology History Overview Note  # July 2021- RLL- 9.4 cm x 7.5 cm x 6.7 cm lobulated low-attenuation heterogeneous lung mass within the posteromedial aspect of the right lower lobe, at the level of the right hilum; positive for hilar; mediastinal adenopathy; PET scan-July 2021 + for adrenal metastases; CT-guided biopsy-non-small cell favor adenocarcinoma; necrosis  # carbo-alimtac  [name]; NGS pending] cycle #2 carbo Alimta [place]; 10/18/2020-maintenance Keytruda  # 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 CKD  # NGS/MOLECULAR TESTS: TPS =70%; rest **  # PALLIATIVE CARE EVALUATION:     DIAGNOSIS: Lung cancer  STAGE: 4       ;  GOALS: Palliative  CURRENT/MOST RECENT THERAPY : Carbo Alimta Keytruda    Cancer of lower lobe of right lung ([place])  07/05/2020 Initial Diagnosis   Cancer of lower lobe of right lung ([place])   07/26/2020 -  Chemotherapy   Patient is on Treatment Plan : LUNG CARBOplatin / Pemetrexed / Pembrolizumab q21d Induction x 4 cycles / Maintenance Pemetrexed + Pembrolizumab       HISTORY OF PRESENTING ILLNESS: Ambulating independently.  Accompanied by his wife.  Ralph Williams 81 y.o.  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  Patient denies any unusual weight loss or weight gain.  No headaches.  No nausea or vomiting. No new shortness of breath or cough.   Review of Systems  Constitutional:  Negative for chills, diaphoresis, fever, malaise/fatigue and weight loss.  HENT:  Negative for nosebleeds and sore throat.   Eyes:  Negative for double vision.  Respiratory:  Negative for  hemoptysis, sputum production and shortness of breath.   Cardiovascular:  Negative for chest pain, palpitations, orthopnea and leg swelling.  Gastrointestinal:  Negative for abdominal pain, blood in stool, constipation, diarrhea, heartburn, melena, nausea and vomiting.  Genitourinary:  Negative for dysuria, frequency and urgency.  Musculoskeletal:  Negative for back pain and joint pain.  Neurological:  Negative for dizziness, tingling, focal weakness, weakness and headaches.  Endo/Heme/Allergies:  Does not bruise/bleed easily.  Psychiatric/Behavioral:  Negative for depression. The patient is not nervous/anxious.     MEDICAL HISTORY:  Past Medical History:  Diagnosis Date   Chronic renal insufficiency    Diabetes mellitus without complication ([place])    Pt states it's diet controlled.   GERD (gastroesophageal reflux disease)    Hyperlipidemia associated with type 2 diabetes mellitus (HCC)    Hypertension    Lung mass    Non-small cell lung cancer, right ([place]) 06/2020    SURGICAL HISTORY: Past Surgical History:  Procedure Laterality Date   IR IMAGING GUIDED PORT INSERTION  07/22/2020   lymphoma removal     back    SOCIAL HISTORY: Social History   Socioeconomic History   Marital status: Married    Spouse name: Not on file   Number of children: Not on file   Years of education: Not on file   Highest education level: Not on file  Occupational History   Not on file  Tobacco Use   Smoking status: Former    Types: Cigarettes    Quit date: 07/23/2007    Years since  quitting: 14.4   Smokeless tobacco: Never  Vaping Use   Vaping Use: Never used  Substance and Sexual Activity   Alcohol use: Not Currently    Comment: none for 5 months   Drug use: Never   Sexual activity: Not on file  Other Topics Concern   Not on file  Social History Narrative   Moved from [place]; lives in [place] in 2021; daughter lives next door. Quit smoking- 13 years ago. Social alcohol. Retd.  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    Social Determinants of Health   Financial Resource Strain: Not on file  Food Insecurity: Not on file  Transportation Needs: Not on file  Physical Activity: Not on file  Stress: Not on file  Social Connections: Not on file  Intimate Partner Violence: Not on file    FAMILY HISTORY: Family History  Problem Relation Age of Onset   Liver cancer Sister    Cancer - Other Brother        bone cancer [2001]    ALLERGIES:  is allergic to niacin and related.  MEDICATIONS:  Current Outpatient Medications  Medication Sig Dispense Refill   acetaminophen (TYLENOL) 500 MG tablet Take 2 tablets by mouth every 8 (eight) hours as needed.     aspirin 325 MG EC tablet Take 1 tablet by mouth daily.     atenolol (TENORMIN) 25 MG tablet Take 1 tablet by mouth daily.     atorvastatin (LIPITOR) 40 MG tablet Take 1 tablet by mouth daily.     hydrochlorothiazide (HYDRODIURIL) 12.5 MG tablet Take 12.5 mg by mouth daily.     lidocaine-prilocaine (EMLA) cream Apply 1 application topically as needed. 30 g 4   losartan (COZAAR) 50 MG tablet Take 1 tablet by mouth daily.     Melatonin 10 MG TABS Take 1 tablet by mouth at bedtime.     metFORMIN (GLUCOPHAGE) 500 MG tablet TAKE ONE TABLET BY MOUTH TWICE A DAY WITH A MEAL 60 tablet 3   Omega-3 1000 MG CAPS Take 1,000 mg by mouth in the morning and at bedtime.     omeprazole (PRILOSEC) 20 MG capsule Take 20 mg by mouth in the morning and at bedtime.     tadalafil (CIALIS) 20 MG tablet Take 1 tablet by mouth daily.     ondansetron (ZOFRAN) 8 MG tablet One pill every 8 hours as needed for nausea/vomitting. (Patient not taking: Reported on 10/07/2021) 40 tablet 1   prochlorperazine (COMPAZINE) 10 MG tablet Take 1 tablet (10 mg total) by mouth every 6 (six) hours as needed for nausea or vomiting. (Patient not taking: Reported on 10/07/2021) 40 tablet 1   No current facility-administered medications for this  visit.   Facility-Administered Medications Ordered in Other Visits  Medication Dose Route Frequency Provider Last Rate Last Admin   heparin lock flush 100 unit/mL  500 Units Intracatheter Once PRN Charlaine Dalton R, MD       heparin lock flush 100 unit/mL  500 Units Intracatheter Once PRN Cammie Sickle, MD       pembrolizumab [facility]) 200 mg in sodium chloride 0.9 % 50 mL chemo infusion  200 mg Intravenous Once Cammie Sickle, MD 116 mL/hr at 01/02/22 1523 200 mg at 01/02/22 1523   prochlorperazine (COMPAZINE) tablet 10 mg  10 mg Oral Once Cammie Sickle, MD       prochlorperazine (COMPAZINE) tablet 10 mg  10 mg Oral Once Cammie Sickle, MD  sodium chloride flush (NS) 0.9 % injection 10 mL  10 mL Intracatheter PRN Cammie Sickle, MD   10 mL at 01/02/22 1515      .  PHYSICAL EXAMINATION: ECOG PERFORMANCE STATUS: 1 - Symptomatic but completely ambulatory  Vitals:   01/02/22 1421  BP: 116/66  Pulse: (!) 55  Resp: 18  Temp: (!) 96.8 F (36 C)   Filed Weights   01/02/22 1421  Weight: 237 lb 9.6 oz (107.8 kg)     Physical Exam Constitutional:      Comments: Ambulating independently.  Accompanied by his wife.   HENT:     Head: Normocephalic and atraumatic.     Mouth/Throat:     Pharynx: No oropharyngeal exudate.  Eyes:     Pupils: Pupils are equal, round, and reactive to light.  Cardiovascular:     Rate and Rhythm: Normal rate and regular rhythm.  Pulmonary:     Effort: Pulmonary effort is normal. No respiratory distress.     Breath sounds: Normal breath sounds. No wheezing.  Abdominal:     General: Bowel sounds are normal. There is no distension.     Palpations: Abdomen is soft. There is no mass.     Tenderness: There is no abdominal tenderness. There is no guarding or rebound.  Musculoskeletal:        General: No tenderness. Normal range of motion.     Cervical back: Normal range of motion and neck supple.  Skin:     General: Skin is warm.  Neurological:     Mental Status: He is alert and oriented to person, place, and time.  Psychiatric:        Mood and Affect: Affect normal.     LABORATORY DATA:  I have reviewed the data as listed Lab Results  Component Value Date   WBC 5.9 01/02/2022   HGB 12.1 (L) 01/02/2022   HCT 37.0 (L) 01/02/2022   MCV 86.7 01/02/2022   PLT 186 01/02/2022   Recent Labs    11/21/21 1228 12/12/21 0844 01/02/22 1409  NA 135 134* 132*  K 3.8 3.9 3.8  CL 105 100 102  CO2 24 24 22   GLUCOSE 88 115* 89  BUN 27* 26* 33*  CREATININE 1.08 1.21 1.38*  CALCIUM 8.9 8.7* 8.5*  GFRNONAA >60 >60 52*  PROT 6.9 7.1 7.0  ALBUMIN 3.7 3.9 4.0  AST 26 24 22   ALT 22 24 20   ALKPHOS 74 80 82  BILITOT 0.9 0.5 0.4    RADIOGRAPHIC STUDIES: I have personally reviewed the radiological images as listed and agreed with the findings in the report. No results found.  ASSESSMENT & PLAN:   Cancer of lower lobe of right lung ([place]) # Lung cancer-non-small cell favor adeno ca; Stage IV;NOV 5th, 2022- Stable exam. No new or progressive findings to suggest recurrent or metastatic disease; Stable- 19 mm exophytic lesion upper pole left kidney characterized as solid enhancing neoplasm on MRI of 01/22/2021.  # proceed with Keytruda maintenance Labs today reviewed;  acceptable for treatment today. Plan CT scans q 3-4 months   #Anemia-mild-12.2/suspect CKD.  Check iron studies.  Recommend gentle iron.  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   # DM-2;-FBG- 89; MAY 2022-Hb1a C-6.5. On Metformin 500 mg BID;  refilled;  monitor now; STABLE.    # DISPOSITION: later appt in AM # add Iron studies/ferritin-  # Keytruda today # follow up in 3 weekMD; labs- cbc/cmp;  Keytruda--Dr.B    All questions were answered. The patient knows to call the clinic with any problems, questions or concerns.    Cammie Sickle, MD 01/02/2022 3:45 PM

## 2022-01-02 NOTE — Assessment & Plan Note (Addendum)
#   Lung cancer-non-small cell favor adeno ca; Stage IV;NOV 5th, 2022- Stable exam. No new or progressive findings to suggest recurrent or metastatic disease; Stable- 19 mm exophytic lesion upper pole left kidney characterized as solid enhancing neoplasm on MRI of 01/22/2021.  # proceed with Keytruda maintenance Labs today reviewed;  acceptable for treatment today. Plan CT scans q 3-4 months   #Anemia-mild-12.2/suspect CKD.  Check iron studies.  Recommend gentle iron.  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   # DM-2;-FBG- 89; MAY 2022-Hb1a C-6.5. On Metformin 500 mg BID; refilled;  monitor now; STABLE.    # DISPOSITION: later appt in AM # add Iron studies/ferritin-  # Keytruda today # follow up in 3 weekMD; labs- cbc/cmp; Keytruda--Dr.B

## 2022-01-23 ENCOUNTER — Inpatient Hospital Stay: Payer: Medicare PPO

## 2022-01-23 ENCOUNTER — Other Ambulatory Visit: Payer: Self-pay

## 2022-01-23 ENCOUNTER — Inpatient Hospital Stay: Payer: Medicare PPO | Admitting: Internal Medicine

## 2022-01-23 ENCOUNTER — Inpatient Hospital Stay: Payer: Medicare PPO | Attending: Oncology

## 2022-01-23 ENCOUNTER — Encounter: Payer: Self-pay | Admitting: Internal Medicine

## 2022-01-23 VITALS — BP 122/64 | HR 62 | Temp 97.0°F | Ht 67.0 in | Wt 240.0 lb

## 2022-01-23 DIAGNOSIS — E1122 Type 2 diabetes mellitus with diabetic chronic kidney disease: Secondary | ICD-10-CM | POA: Diagnosis not present

## 2022-01-23 DIAGNOSIS — Z87891 Personal history of nicotine dependence: Secondary | ICD-10-CM | POA: Insufficient documentation

## 2022-01-23 DIAGNOSIS — C7972 Secondary malignant neoplasm of left adrenal gland: Secondary | ICD-10-CM | POA: Diagnosis not present

## 2022-01-23 DIAGNOSIS — I129 Hypertensive chronic kidney disease with stage 1 through stage 4 chronic kidney disease, or unspecified chronic kidney disease: Secondary | ICD-10-CM | POA: Diagnosis not present

## 2022-01-23 DIAGNOSIS — C7971 Secondary malignant neoplasm of right adrenal gland: Secondary | ICD-10-CM | POA: Insufficient documentation

## 2022-01-23 DIAGNOSIS — Z7984 Long term (current) use of oral hypoglycemic drugs: Secondary | ICD-10-CM | POA: Diagnosis not present

## 2022-01-23 DIAGNOSIS — C3431 Malignant neoplasm of lower lobe, right bronchus or lung: Secondary | ICD-10-CM | POA: Insufficient documentation

## 2022-01-23 DIAGNOSIS — Z79899 Other long term (current) drug therapy: Secondary | ICD-10-CM | POA: Diagnosis not present

## 2022-01-23 DIAGNOSIS — C642 Malignant neoplasm of left kidney, except renal pelvis: Secondary | ICD-10-CM | POA: Insufficient documentation

## 2022-01-23 DIAGNOSIS — D649 Anemia, unspecified: Secondary | ICD-10-CM | POA: Diagnosis not present

## 2022-01-23 DIAGNOSIS — N182 Chronic kidney disease, stage 2 (mild): Secondary | ICD-10-CM | POA: Diagnosis not present

## 2022-01-23 DIAGNOSIS — Z5112 Encounter for antineoplastic immunotherapy: Secondary | ICD-10-CM | POA: Diagnosis not present

## 2022-01-23 DIAGNOSIS — K219 Gastro-esophageal reflux disease without esophagitis: Secondary | ICD-10-CM | POA: Insufficient documentation

## 2022-01-23 DIAGNOSIS — Z7982 Long term (current) use of aspirin: Secondary | ICD-10-CM | POA: Diagnosis not present

## 2022-01-23 LAB — CBC WITH DIFFERENTIAL/PLATELET
Abs Immature Granulocytes: 0 10*3/uL (ref 0.00–0.07)
Basophils Absolute: 0 10*3/uL (ref 0.0–0.1)
Basophils Relative: 0 %
Eosinophils Absolute: 0.1 10*3/uL (ref 0.0–0.5)
Eosinophils Relative: 2 %
HCT: 38.2 % — ABNORMAL LOW (ref 39.0–52.0)
Hemoglobin: 12.5 g/dL — ABNORMAL LOW (ref 13.0–17.0)
Immature Granulocytes: 0 %
Lymphocytes Relative: 38 %
Lymphs Abs: 2.4 10*3/uL (ref 0.7–4.0)
MCH: 29.1 pg (ref 26.0–34.0)
MCHC: 32.7 g/dL (ref 30.0–36.0)
MCV: 88.8 fL (ref 80.0–100.0)
Monocytes Absolute: 0.5 10*3/uL (ref 0.1–1.0)
Monocytes Relative: 8 %
Neutro Abs: 3.2 10*3/uL (ref 1.7–7.7)
Neutrophils Relative %: 52 %
Platelets: 180 10*3/uL (ref 150–400)
RBC: 4.3 MIL/uL (ref 4.22–5.81)
RDW: 15.8 % — ABNORMAL HIGH (ref 11.5–15.5)
WBC: 6.2 10*3/uL (ref 4.0–10.5)
nRBC: 0 % (ref 0.0–0.2)

## 2022-01-23 LAB — COMPREHENSIVE METABOLIC PANEL
ALT: 26 U/L (ref 0–44)
AST: 23 U/L (ref 15–41)
Albumin: 3.9 g/dL (ref 3.5–5.0)
Alkaline Phosphatase: 71 U/L (ref 38–126)
Anion gap: 6 (ref 5–15)
BUN: 25 mg/dL — ABNORMAL HIGH (ref 8–23)
CO2: 24 mmol/L (ref 22–32)
Calcium: 8.4 mg/dL — ABNORMAL LOW (ref 8.9–10.3)
Chloride: 104 mmol/L (ref 98–111)
Creatinine, Ser: 1.2 mg/dL (ref 0.61–1.24)
GFR, Estimated: >60 mL/min (ref >60)
Glucose, Bld: 104 mg/dL — ABNORMAL HIGH (ref 70–99)
Potassium: 4.1 mmol/L (ref 3.5–5.1)
Sodium: 134 mmol/L — ABNORMAL LOW (ref 135–145)
Total Bilirubin: 0.5 mg/dL (ref 0.3–1.2)
Total Protein: 7.1 g/dL (ref 6.5–8.1)

## 2022-01-23 MED ORDER — SODIUM CHLORIDE 0.9 % IV SOLN
Freq: Once | INTRAVENOUS | Status: AC
  Filled 2022-01-23: qty 250

## 2022-01-23 MED ORDER — HEPARIN SOD (PORK) LOCK FLUSH 100 UNIT/ML IV SOLN
500.0000 [IU] | Freq: Once | INTRAVENOUS | Status: AC | PRN
  Filled 2022-01-23: qty 5

## 2022-01-23 MED ORDER — SODIUM CHLORIDE 0.9 % IV SOLN
200.0000 mg | Freq: Once | INTRAVENOUS | Status: AC
  Administered 2022-01-23: 200 mg via INTRAVENOUS
  Filled 2022-01-23: qty 200

## 2022-01-23 MED ORDER — HEPARIN SOD (PORK) LOCK FLUSH 100 UNIT/ML IV SOLN
INTRAVENOUS | Status: AC
  Administered 2022-01-23: 500 [IU]
  Filled 2022-01-23: qty 5

## 2022-01-23 NOTE — Progress Notes (Signed)
[facility] NOTE  Patient Care Team: Cox, Okey Regal, MD as PCP - General (Family Medicine) Telford Nab, RN as Oncology Nurse Navigator  CHIEF COMPLAINTS/PURPOSE OF CONSULTATION: lung cancer  #  Oncology History Overview Note  # July 2021- RLL- 9.4 cm x 7.5 cm x 6.7 cm lobulated low-attenuation heterogeneous lung mass within the posteromedial aspect of the right lower lobe, at the level of the right hilum; positive for hilar; mediastinal adenopathy; PET scan-July 2021 + for adrenal metastases; CT-guided biopsy-non-small cell favor adenocarcinoma; necrosis  # carbo-alimtac  [name]; NGS pending] cycle #2 carbo Alimta [place]; 10/18/2020-maintenance Keytruda  # 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 CKD  # NGS/MOLECULAR TESTS: TPS =70%; rest **  # PALLIATIVE CARE EVALUATION:     DIAGNOSIS: Lung cancer  STAGE: 4       ;  GOALS: Palliative  CURRENT/MOST RECENT THERAPY : Carbo Alimta Keytruda    Cancer of lower lobe of right lung ([place])  07/05/2020 Initial Diagnosis   Cancer of lower lobe of right lung ([place])   07/26/2020 -  Chemotherapy   Patient is on Treatment Plan : LUNG CARBOplatin / Pemetrexed / Pembrolizumab q21d Induction x 4 cycles / Maintenance Pemetrexed + Pembrolizumab       HISTORY OF PRESENTING ILLNESS: Ambulating independently.  Accompanied by his wife.  Ralph Williams 81 y.o.  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  No nausea no vomiting.  No cough.  No chest pain.  No headaches.  Denies any fatigue.  No constipation no diarrhea.  Review of Systems  Constitutional:  Negative for chills, diaphoresis, fever, malaise/fatigue and weight loss.  HENT:  Negative for nosebleeds and sore throat.   Eyes:  Negative for double vision.  Respiratory:  Negative for hemoptysis, sputum  production and shortness of breath.   Cardiovascular:  Negative for chest pain, palpitations, orthopnea and leg swelling.  Gastrointestinal:  Negative for abdominal pain, blood in stool, constipation, diarrhea, heartburn, melena, nausea and vomiting.  Genitourinary:  Negative for dysuria, frequency and urgency.  Musculoskeletal:  Negative for back pain and joint pain.  Neurological:  Negative for dizziness, tingling, focal weakness, weakness and headaches.  Endo/Heme/Allergies:  Does not bruise/bleed easily.  Psychiatric/Behavioral:  Negative for depression. The patient is not nervous/anxious.     MEDICAL HISTORY:  Past Medical History:  Diagnosis Date   Chronic renal insufficiency    Diabetes mellitus without complication ([place])    Pt states it's diet controlled.   GERD (gastroesophageal reflux disease)    Hyperlipidemia associated with type 2 diabetes mellitus (HCC)    Hypertension    Lung mass    Non-small cell lung cancer, right ([place]) 06/2020    SURGICAL HISTORY: Past Surgical History:  Procedure Laterality Date   IR IMAGING GUIDED PORT INSERTION  07/22/2020   lymphoma removal     back    SOCIAL HISTORY: Social History   Socioeconomic History   Marital status: Married    Spouse name: Not on file   Number of children: Not on file   Years of education: Not on file   Highest education level: Not on file  Occupational History   Not on file  Tobacco Use   Smoking status: Former    Types: Cigarettes    Quit date: 07/23/2007    Years since quitting: 68.5  Smokeless tobacco: Never  Vaping Use   Vaping Use: Never used  Substance and Sexual Activity   Alcohol use: Not Currently    Comment: none for 5 months   Drug use: Never   Sexual activity: Not on file  Other Topics Concern   Not on file  Social History Narrative   Moved from [place]; lives in [place] in 2021; daughter lives next door. Quit smoking- 13 years ago. Social alcohol. 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    Social Determinants of Health   Financial Resource Strain: Not on file  Food Insecurity: Not on file  Transportation Needs: Not on file  Physical Activity: Not on file  Stress: Not on file  Social Connections: Not on file  Intimate Partner Violence: Not on file    FAMILY HISTORY: Family History  Problem Relation Age of Onset   Liver cancer Sister    Cancer - Other Brother        bone cancer [2001]    ALLERGIES:  is allergic to niacin and related.  MEDICATIONS:  Current Outpatient Medications  Medication Sig Dispense Refill   acetaminophen (TYLENOL) 500 MG tablet Take 2 tablets by mouth every 8 (eight) hours as needed.     aspirin 325 MG EC tablet Take 1 tablet by mouth daily.     atenolol (TENORMIN) 25 MG tablet Take 1 tablet by mouth daily.     atorvastatin (LIPITOR) 40 MG tablet Take 1 tablet by mouth daily.     hydrochlorothiazide (HYDRODIURIL) 12.5 MG tablet Take 12.5 mg by mouth daily.     lidocaine-prilocaine (EMLA) cream Apply 1 application topically as needed. 30 g 4   losartan (COZAAR) 50 MG tablet Take 1 tablet by mouth daily.     Melatonin 10 MG TABS Take 1 tablet by mouth at bedtime.     metFORMIN (GLUCOPHAGE) 500 MG tablet TAKE ONE TABLET BY MOUTH TWICE A DAY WITH A MEAL 60 tablet 3   Omega-3 1000 MG CAPS Take 1,000 mg by mouth in the morning and at bedtime.     omeprazole (PRILOSEC) 20 MG capsule Take 20 mg by mouth in the morning and at bedtime.     ondansetron (ZOFRAN) 8 MG tablet One pill every 8 hours as needed for nausea/vomitting. (Patient not taking: Reported on 10/07/2021) 40 tablet 1   prochlorperazine (COMPAZINE) 10 MG tablet Take 1 tablet (10 mg total) by mouth every 6 (six) hours as needed for nausea or vomiting. (Patient not taking: Reported on 10/07/2021) 40 tablet 1   tadalafil (CIALIS) 20 MG tablet Take 1 tablet by mouth daily.     No current facility-administered medications for this visit.    Facility-Administered Medications Ordered in Other Visits  Medication Dose Route Frequency Provider Last Rate Last Admin   heparin lock flush 100 unit/mL  500 Units Intracatheter Once PRN Cammie Sickle, MD       heparin lock flush 100 unit/mL  500 Units Intracatheter Once PRN Cammie Sickle, MD       pembrolizumab [facility]) 200 mg in sodium chloride 0.9 % 50 mL chemo infusion  200 mg Intravenous Once Cammie Sickle, MD       prochlorperazine (COMPAZINE) tablet 10 mg  10 mg Oral Once Cammie Sickle, MD          .  PHYSICAL EXAMINATION: ECOG PERFORMANCE STATUS: 1 - Symptomatic but completely ambulatory  Vitals:   01/23/22 1305  BP: 122/64  Pulse: 62  Temp: (!) 97 F (36.1 C)  SpO2: 99%   Filed Weights   01/23/22 1305  Weight: 240 lb (108.9 kg)     Physical Exam Constitutional:      Comments: Ambulating independently.  Accompanied by his wife.   HENT:     Head: Normocephalic and atraumatic.     Mouth/Throat:     Pharynx: No oropharyngeal exudate.  Eyes:     Pupils: Pupils are equal, round, and reactive to light.  Cardiovascular:     Rate and Rhythm: Normal rate and regular rhythm.  Pulmonary:     Effort: Pulmonary effort is normal. No respiratory distress.     Breath sounds: Normal breath sounds. No wheezing.  Abdominal:     General: Bowel sounds are normal. There is no distension.     Palpations: Abdomen is soft. There is no mass.     Tenderness: There is no abdominal tenderness. There is no guarding or rebound.  Musculoskeletal:        General: No tenderness. Normal range of motion.     Cervical back: Normal range of motion and neck supple.  Skin:    General: Skin is warm.  Neurological:     Mental Status: He is alert and oriented to person, place, and time.  Psychiatric:        Mood and Affect: Affect normal.     LABORATORY DATA:  I have reviewed the data as listed Lab Results  Component Value Date   WBC 6.2 01/23/2022    HGB 12.5 (L) 01/23/2022   HCT 38.2 (L) 01/23/2022   MCV 88.8 01/23/2022   PLT 180 01/23/2022   Recent Labs    12/12/21 0844 01/02/22 1409 01/23/22 1230  NA 134* 132* 134*  K 3.9 3.8 4.1  CL 100 102 104  CO2 24 22 24   GLUCOSE 115* 89 104*  BUN 26* 33* 25*  CREATININE 1.21 1.38* 1.20  CALCIUM 8.7* 8.5* 8.4*  GFRNONAA >60 52* >60  PROT 7.1 7.0 7.1  ALBUMIN 3.9 4.0 3.9  AST 24 22 23   ALT 24 20 26   ALKPHOS 80 82 71  BILITOT 0.5 0.4 0.5    RADIOGRAPHIC STUDIES: I have personally reviewed the radiological images as listed and agreed with the findings in the report. No results found.  ASSESSMENT & PLAN:   Cancer of lower lobe of right lung ([place]) # Lung cancer-non-small cell favor adeno ca; Stage IV;NOV 5th, 2022- Stable exam. No new or progressive findings to suggest recurrent or metastatic disease; Stable- 19 mm exophytic lesion upper pole left kidney characterized as solid enhancing neoplasm on MRI of 01/22/2021.  # proceed with Keytruda maintenance Labs today reviewed;  acceptable for treatment today.  CT scan ordered today/prior to next visit.  # Anemia-mild-12.2/suspect CKD.  iron studies.  Recommend gentle iron; Colonoscopy [[facility]; GI- due next year]; awaiting stool occult- with PCP. Recommend GI evaluation-referral made.  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   # DM-2;PBF- 104; MAY 2022-Hb1a C-6.5. On Metformin 500 mg BID; refilled;  monitor now; STABLE.    # DISPOSITION: later appt in AM # referral to Dr.joseph Saraceno; GI [re: iron deficiency anemia; [facility]; [place], [place]; ph: [phone]] # Keytruda today # follow up in 3 weekMD; labs- cbc/cmp; Keytruda; CTAP prior--Dr.B  All questions were answered. The patient knows to call the clinic with any  problems, questions or concerns.    Cammie Sickle, MD 01/23/2022 1:56 PM

## 2022-01-23 NOTE — Assessment & Plan Note (Addendum)
#   Lung cancer-non-small cell favor adeno ca; Stage IV;NOV 5th, 2022- Stable exam. No new or progressive findings to suggest recurrent or metastatic disease; Stable- 19 mm exophytic lesion upper pole left kidney characterized as solid enhancing neoplasm on MRI of 01/22/2021.  # proceed with Keytruda maintenance Labs today reviewed;  acceptable for treatment today.  CT scan ordered today/prior to next visit.  # Anemia-mild-12.2/suspect CKD.  iron studies.  Recommend gentle iron; Colonoscopy [[facility]; GI- due next year]; awaiting stool occult- with PCP. Recommend GI evaluation-referral made.  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   # DM-2;PBF- 104; MAY 2022-Hb1a C-6.5. On Metformin 500 mg BID; refilled;  monitor now; STABLE.    # DISPOSITION: later appt in AM # referral to Dr.joseph Saraceno; GI [re: iron deficiency anemia; [facility]; [place], [place]; ph: [phone]] # Keytruda today # follow up in 3 weekMD; labs- cbc/cmp; Keytruda; CTAP prior--Dr.B

## 2022-01-23 NOTE — Patient Instructions (Signed)
[facility] CANCER CTR AT [place]  Discharge Instructions: Thank you for choosing [facility] to provide your oncology and hematology care.  If you have a lab appointment with the [facility], please go directly to the [facility] and check in at the registration area.  Wear comfortable clothing and clothing appropriate for easy access to any Portacath or PICC line.   We strive to give you quality time with your provider. You may need to reschedule your appointment if you arrive late (15 or more minutes).  Arriving late affects you and other patients whose appointments are after yours.  Also, if you miss three or more appointments without notifying the office, you may be dismissed from the clinic at the providers discretion.      For prescription refill requests, have your pharmacy contact our office and allow 72 hours for refills to be completed.    Today you received the following chemotherapy and/or immunotherapy agents Beryle Flock   To help prevent nausea and vomiting after your treatment, we encourage you to take your nausea medication as directed.  BELOW ARE SYMPTOMS THAT SHOULD BE REPORTED IMMEDIATELY: *FEVER GREATER THAN 100.4 F (38 C) OR HIGHER *CHILLS OR SWEATING *NAUSEA AND VOMITING THAT IS NOT CONTROLLED WITH YOUR NAUSEA MEDICATION *UNUSUAL SHORTNESS OF BREATH *UNUSUAL BRUISING OR BLEEDING *URINARY PROBLEMS (pain or burning when urinating, or frequent urination) *BOWEL PROBLEMS (unusual diarrhea, constipation, pain near the anus) TENDERNESS IN MOUTH AND THROAT WITH OR WITHOUT PRESENCE OF ULCERS (sore throat, sores in mouth, or a toothache) UNUSUAL RASH, SWELLING OR PAIN  UNUSUAL VAGINAL DISCHARGE OR ITCHING   Items with * indicate a potential emergency and should be followed up as soon as possible or go to the Emergency Department if any problems should occur.  Please show the CHEMOTHERAPY ALERT CARD or IMMUNOTHERAPY ALERT CARD at check-in to the  Emergency Department and triage nurse.  Should you have questions after your visit or need to cancel or reschedule your appointment, please contact [facility] CANCER [place] AT [place]  [phone] and follow the prompts.  Office hours are 8:00 a.m. to 4:30 p.m. Monday - Friday. Please note that voicemails left after 4:00 p.m. may not be returned until the following business day.  We are closed weekends and major holidays. You have access to a nurse at all times for urgent questions. Please call the main number to the clinic [phone] and follow the prompts.  For any non-urgent questions, you may also contact your provider using MyChart. We now offer e-Visits for anyone 61 and older to request care online for non-urgent symptoms. For details visit [email or web address].   Also download the MyChart app! Go to the app store, search "MyChart", open the app, select [facility], and log in with your MyChart username and password.  Due to Covid, a mask is required upon entering the hospital/clinic. If you do not have a mask, one will be given to you upon arrival. For doctor visits, patients may have 1 support person aged 57 or older with them. For treatment visits, patients cannot have anyone with them due to current Covid guidelines and our immunocompromised population.

## 2022-01-26 ENCOUNTER — Telehealth: Payer: Self-pay

## 2022-01-26 NOTE — Telephone Encounter (Signed)
Dr. B has a referral for the pt at [facility], Dr. Carolynn Sayers is retiring and no longer taking pts.  I left a message on pt's vm to let us know if there is another provider he prefers or can we refer in house?

## 2022-01-26 NOTE — Telephone Encounter (Signed)
Left message to return call 

## 2022-01-27 NOTE — Telephone Encounter (Signed)
Pt left a message, he would like referral sent to [facility] in [place]. JW:[phone] Fax: [phone]  Referral faxed.

## 2022-01-29 NOTE — Telephone Encounter (Signed)
I called [facility] to check on status of the appt, faxe # has changed from what is on their website. Spoke with Bonnita Nasuti, she gave me fax [phone], ph# [phone].  Referral re-faxed.

## 2022-02-05 NOTE — Telephone Encounter (Signed)
I called the office today for update, no appt sch'd yet. I was told they are behind on sch.

## 2022-02-10 ENCOUNTER — Encounter: Payer: Self-pay | Admitting: Internal Medicine

## 2022-02-10 ENCOUNTER — Other Ambulatory Visit: Payer: Self-pay

## 2022-02-10 ENCOUNTER — Ambulatory Visit
Admission: RE | Admit: 2022-02-10 | Discharge: 2022-02-10 | Disposition: A | Discharge status: Home / Self Care | Payer: Medicare PPO | Source: Ambulatory Visit | Attending: Internal Medicine | Admitting: Internal Medicine

## 2022-02-10 DIAGNOSIS — C3431 Malignant neoplasm of lower lobe, right bronchus or lung: Secondary | ICD-10-CM | POA: Insufficient documentation

## 2022-02-10 MED ORDER — IOHEXOL 300 MG/ML  SOLN
100.0000 mL | Freq: Once | INTRAMUSCULAR | Status: AC | PRN
  Administered 2022-02-10: 100 mL via INTRAVENOUS

## 2022-02-10 NOTE — Telephone Encounter (Signed)
Per [facility]: "You sent a GI referral on this patient they call and stated that they are unable to see this patient that he needs to go back to his original GI he seen before."  His original GI is retiring and no longer taking appts, please advise.

## 2022-02-10 NOTE — Telephone Encounter (Signed)
Per Dr. Jacinto Reap, reach out to pt's pcp Cox, Okey Regal, MD @ [facility] to see if he will do a referral for GI for the pt. Ph#:[phone]

## 2022-02-10 NOTE — Telephone Encounter (Signed)
Spoke with Tiffany at [facility], she will put in a request for pcp to send referral to GI.

## 2022-02-13 ENCOUNTER — Inpatient Hospital Stay: Payer: Medicare PPO

## 2022-02-13 ENCOUNTER — Encounter: Payer: Self-pay | Admitting: Internal Medicine

## 2022-02-13 ENCOUNTER — Other Ambulatory Visit: Payer: Medicare PPO

## 2022-02-13 ENCOUNTER — Ambulatory Visit: Payer: Medicare PPO

## 2022-02-13 ENCOUNTER — Ambulatory Visit: Payer: Medicare PPO | Admitting: Internal Medicine

## 2022-02-13 ENCOUNTER — Inpatient Hospital Stay: Payer: Medicare PPO | Admitting: Internal Medicine

## 2022-02-13 ENCOUNTER — Other Ambulatory Visit: Payer: Self-pay

## 2022-02-13 DIAGNOSIS — C3431 Malignant neoplasm of lower lobe, right bronchus or lung: Secondary | ICD-10-CM

## 2022-02-13 DIAGNOSIS — Z5112 Encounter for antineoplastic immunotherapy: Secondary | ICD-10-CM | POA: Diagnosis not present

## 2022-02-13 LAB — COMPREHENSIVE METABOLIC PANEL
ALT: 23 U/L (ref 0–44)
AST: 20 U/L (ref 15–41)
Albumin: 3.7 g/dL (ref 3.5–5.0)
Alkaline Phosphatase: 70 U/L (ref 38–126)
Anion gap: 7 (ref 5–15)
BUN: 28 mg/dL — ABNORMAL HIGH (ref 8–23)
CO2: 24 mmol/L (ref 22–32)
Calcium: 8.7 mg/dL — ABNORMAL LOW (ref 8.9–10.3)
Chloride: 104 mmol/L (ref 98–111)
Creatinine, Ser: 1.38 mg/dL — ABNORMAL HIGH (ref 0.61–1.24)
GFR, Estimated: 52 mL/min — ABNORMAL LOW (ref >60)
Glucose, Bld: 96 mg/dL (ref 70–99)
Potassium: 3.9 mmol/L (ref 3.5–5.1)
Sodium: 135 mmol/L (ref 135–145)
Total Bilirubin: 0.4 mg/dL (ref 0.3–1.2)
Total Protein: 7 g/dL (ref 6.5–8.1)

## 2022-02-13 LAB — CBC WITH DIFFERENTIAL/PLATELET
Abs Immature Granulocytes: 0.01 10*3/uL (ref 0.00–0.07)
Basophils Absolute: 0 10*3/uL (ref 0.0–0.1)
Basophils Relative: 0 %
Eosinophils Absolute: 0.1 10*3/uL (ref 0.0–0.5)
Eosinophils Relative: 2 %
HCT: 41.6 % (ref 39.0–52.0)
Hemoglobin: 13.4 g/dL (ref 13.0–17.0)
Immature Granulocytes: 0 %
Lymphocytes Relative: 44 %
Lymphs Abs: 2.3 10*3/uL (ref 0.7–4.0)
MCH: 29.5 pg (ref 26.0–34.0)
MCHC: 32.2 g/dL (ref 30.0–36.0)
MCV: 91.6 fL (ref 80.0–100.0)
Monocytes Absolute: 0.6 10*3/uL (ref 0.1–1.0)
Monocytes Relative: 11 %
Neutro Abs: 2.3 10*3/uL (ref 1.7–7.7)
Neutrophils Relative %: 43 %
Platelets: 181 10*3/uL (ref 150–400)
RBC: 4.54 MIL/uL (ref 4.22–5.81)
RDW: 15.4 % (ref 11.5–15.5)
WBC: 5.3 10*3/uL (ref 4.0–10.5)
nRBC: 0 % (ref 0.0–0.2)

## 2022-02-13 MED ORDER — SODIUM CHLORIDE 0.9 % IV SOLN
200.0000 mg | Freq: Once | INTRAVENOUS | Status: AC
  Administered 2022-02-13: 200 mg via INTRAVENOUS
  Filled 2022-02-13: qty 8

## 2022-02-13 MED ORDER — HEPARIN SOD (PORK) LOCK FLUSH 100 UNIT/ML IV SOLN
500.0000 [IU] | Freq: Once | INTRAVENOUS | Status: AC | PRN
  Administered 2022-02-13: 500 [IU]
  Filled 2022-02-13: qty 5

## 2022-02-13 MED ORDER — SODIUM CHLORIDE 0.9 % IV SOLN
Freq: Once | INTRAVENOUS | Status: AC
  Filled 2022-02-13: qty 250

## 2022-02-13 MED ORDER — HEPARIN SOD (PORK) LOCK FLUSH 100 UNIT/ML IV SOLN
INTRAVENOUS | Status: AC
  Filled 2022-02-13: qty 5

## 2022-02-13 MED ORDER — SODIUM CHLORIDE 0.9% FLUSH
10.0000 mL | Freq: Once | INTRAVENOUS | Status: AC
  Administered 2022-02-13: 10 mL via INTRAVENOUS
  Filled 2022-02-13: qty 10

## 2022-02-13 NOTE — Progress Notes (Signed)
[facility] NOTE  Patient Care Team: Cox, Okey Regal, MD as PCP - General (Family Medicine) Telford Nab, RN as Oncology Nurse Navigator  CHIEF COMPLAINTS/PURPOSE OF CONSULTATION: lung cancer  #  Oncology History Overview Note  # July 2021- RLL- 9.4 cm x 7.5 cm x 6.7 cm lobulated low-attenuation heterogeneous lung mass within the posteromedial aspect of the right lower lobe, at the level of the right hilum; positive for hilar; mediastinal adenopathy; PET scan-July 2021 + for adrenal metastases; CT-guided biopsy-non-small cell favor adenocarcinoma; necrosis  # carbo-alimtac  [name]; NGS pending] cycle #2 carbo Alimta Fowler; 10/18/2020-maintenance Keytruda  # 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 CKD  # NGS/MOLECULAR TESTS: TPS =70%; rest **  # PALLIATIVE CARE EVALUATION:     DIAGNOSIS: Lung cancer  STAGE: 4       ;  GOALS: Palliative  CURRENT/MOST RECENT THERAPY : Carbo Alimta Keytruda    Cancer of lower lobe of right lung ([place])  07/05/2020 Initial Diagnosis   Cancer of lower lobe of right lung ([place])   07/26/2020 -  Chemotherapy   Patient is on Treatment Plan : LUNG CARBOplatin / Pemetrexed / Pembrolizumab q21d Induction x 4 cycles / Maintenance Pemetrexed + Pembrolizumab       HISTORY OF PRESENTING ILLNESS: Ambulating independently.  Accompanied by his wife.  Ralph Williams 81 y.o.  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  No nausea no vomiting.  No cough.  No chest pain.  No headaches.  Denies any fatigue.  No constipation no diarrhea.   Patient awaiting to go on a trip to [place] next week.  Review of Systems  Constitutional:  Negative for chills, diaphoresis, fever, malaise/fatigue and weight loss.  HENT:  Negative for nosebleeds and sore throat.    Eyes:  Negative for double vision.  Respiratory:  Negative for hemoptysis, sputum production and shortness of breath.   Cardiovascular:  Negative for chest pain, palpitations, orthopnea and leg swelling.  Gastrointestinal:  Negative for abdominal pain, blood in stool, constipation, diarrhea, heartburn, melena, nausea and vomiting.  Genitourinary:  Negative for dysuria, frequency and urgency.  Musculoskeletal:  Negative for back pain and joint pain.  Neurological:  Negative for dizziness, tingling, focal weakness, weakness and headaches.  Endo/Heme/Allergies:  Does not bruise/bleed easily.  Psychiatric/Behavioral:  Negative for depression. The patient is not nervous/anxious.     MEDICAL HISTORY:  Past Medical History:  Diagnosis Date   Chronic renal insufficiency    Diabetes mellitus without complication ([place])    Pt states it's diet controlled.   GERD (gastroesophageal reflux disease)    Hyperlipidemia associated with type 2 diabetes mellitus (HCC)    Hypertension    Lung mass    Non-small cell lung cancer, right ([place]) 06/2020    SURGICAL HISTORY: Past Surgical History:  Procedure Laterality Date   IR IMAGING GUIDED PORT INSERTION  07/22/2020   lymphoma removal     back    SOCIAL HISTORY: Social History   Socioeconomic History   Marital status: Married    Spouse name: Not on file   Number of children: Not on file   Years of education: Not on file   Highest education level: Not on file  Occupational History   Not on file  Tobacco Use   Smoking status: Former  Types: Cigarettes    Quit date: 07/23/2007    Years since quitting: 14.5   Smokeless tobacco: Never  Vaping Use   Vaping Use: Never used  Substance and Sexual Activity   Alcohol use: Not Currently    Comment: none for 5 months   Drug use: Never   Sexual activity: Not on file  Other Topics Concern   Not on file  Social History Narrative   Moved from [place]; lives in [place] in 2021; daughter lives  next door. Quit smoking- 13 years ago. Social alcohol. 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    Social Determinants of Health   Financial Resource Strain: Not on file  Food Insecurity: Not on file  Transportation Needs: Not on file  Physical Activity: Not on file  Stress: Not on file  Social Connections: Not on file  Intimate Partner Violence: Not on file    FAMILY HISTORY: Family History  Problem Relation Age of Onset   Liver cancer Sister    Cancer - Other Brother        bone cancer [2001]    ALLERGIES:  is allergic to niacin and related.  MEDICATIONS:  Current Outpatient Medications  Medication Sig Dispense Refill   acetaminophen (TYLENOL) 500 MG tablet Take 2 tablets by mouth every 8 (eight) hours as needed.     aspirin 325 MG EC tablet Take 1 tablet by mouth daily.     atenolol (TENORMIN) 25 MG tablet Take 1 tablet by mouth daily.     atorvastatin (LIPITOR) 40 MG tablet Take 1 tablet by mouth daily.     hydrochlorothiazide (HYDRODIURIL) 12.5 MG tablet Take 12.5 mg by mouth daily.     lidocaine-prilocaine (EMLA) cream Apply 1 application topically as needed. 30 g 4   losartan (COZAAR) 50 MG tablet Take 1 tablet by mouth daily.     Melatonin 10 MG TABS Take 1 tablet by mouth at bedtime.     metFORMIN (GLUCOPHAGE) 500 MG tablet TAKE ONE TABLET BY MOUTH TWICE A DAY WITH A MEAL 60 tablet 3   Omega-3 1000 MG CAPS Take 1,000 mg by mouth in the morning and at bedtime.     omeprazole (PRILOSEC) 20 MG capsule Take 20 mg by mouth in the morning and at bedtime.     ondansetron (ZOFRAN) 8 MG tablet One pill every 8 hours as needed for nausea/vomitting. 40 tablet 1   prochlorperazine (COMPAZINE) 10 MG tablet Take 1 tablet (10 mg total) by mouth every 6 (six) hours as needed for nausea or vomiting. 40 tablet 1   tadalafil (CIALIS) 20 MG tablet Take 1 tablet by mouth daily.     No current facility-administered medications for this visit.   Facility-Administered  Medications Ordered in Other Visits  Medication Dose Route Frequency Provider Last Rate Last Admin   heparin lock flush 100 UNIT/ML injection            heparin lock flush 100 unit/mL  500 Units Intracatheter Once PRN Cammie Sickle, MD       prochlorperazine (COMPAZINE) tablet 10 mg  10 mg Oral Once Cammie Sickle, MD          .  PHYSICAL EXAMINATION: ECOG PERFORMANCE STATUS: 1 - Symptomatic but completely ambulatory  Vitals:   02/13/22 1049  BP: 119/70  Pulse: (!) 57  Temp: 97.6 F (36.4 C)  SpO2: 98%   Filed Weights   02/13/22 1049  Weight: 241 lb 3.2 oz (109.4 kg)  Physical Exam Constitutional:      Comments: Ambulating independently.  Accompanied by his wife.   HENT:     Head: Normocephalic and atraumatic.     Mouth/Throat:     Pharynx: No oropharyngeal exudate.  Eyes:     Pupils: Pupils are equal, round, and reactive to light.  Cardiovascular:     Rate and Rhythm: Normal rate and regular rhythm.  Pulmonary:     Effort: Pulmonary effort is normal. No respiratory distress.     Breath sounds: Normal breath sounds. No wheezing.  Abdominal:     General: Bowel sounds are normal. There is no distension.     Palpations: Abdomen is soft. There is no mass.     Tenderness: There is no abdominal tenderness. There is no guarding or rebound.  Musculoskeletal:        General: No tenderness. Normal range of motion.     Cervical back: Normal range of motion and neck supple.  Skin:    General: Skin is warm.  Neurological:     Mental Status: He is alert and oriented to person, place, and time.  Psychiatric:        Mood and Affect: Affect normal.     LABORATORY DATA:  I have reviewed the data as listed Lab Results  Component Value Date   WBC 5.3 02/13/2022   HGB 13.4 02/13/2022   HCT 41.6 02/13/2022   MCV 91.6 02/13/2022   PLT 181 02/13/2022   Recent Labs    01/02/22 1409 01/23/22 1230 02/13/22 1011  NA 132* 134* 135  K 3.8 4.1 3.9  CL 102  104 104  CO2 22 24 24   GLUCOSE 89 104* 96  BUN 33* 25* 28*  CREATININE 1.38* 1.20 1.38*  CALCIUM 8.5* 8.4* 8.7*  GFRNONAA 52* >60 52*  PROT 7.0 7.1 7.0  ALBUMIN 4.0 3.9 3.7  AST 22 23 20   ALT 20 26 23   ALKPHOS 82 71 70  BILITOT 0.4 0.5 0.4    RADIOGRAPHIC STUDIES: I have personally reviewed the radiological images as listed and agreed with the findings in the report. CT CHEST ABDOMEN PELVIS W CONTRAST  Result Date: 02/12/2022 CLINICAL DATA:  Non-small-cell lung cancer. * onc * EXAM: CT CHEST, ABDOMEN, AND PELVIS WITH CONTRAST TECHNIQUE: Multidetector CT imaging of the chest, abdomen and pelvis was performed following the standard protocol during bolus administration of intravenous contrast. RADIATION DOSE REDUCTION: This exam was performed according to the departmental dose-optimization program which includes automated exposure control, adjustment of the mA and/or kV according to patient size and/or use of iterative reconstruction technique. CONTRAST:  11mL OMNIPAQUE IOHEXOL 300 MG/ML  SOLN COMPARISON:  Chest CT 10/24/2021. FINDINGS: CT CHEST FINDINGS Cardiovascular: The heart size is normal. No substantial pericardial effusion. Coronary artery calcification is evident. Mild atherosclerotic calcification is noted in the wall of the thoracic aorta. Mediastinum/Nodes: No mediastinal lymphadenopathy. There is no hilar lymphadenopathy. The esophagus has normal imaging features. Right Port-A-Cath tip is positioned in the distal SVC. Lungs/Pleura: Post treatment scarring noted retro hilar right lung. No substantial interval change. No suspicious pulmonary nodule or mass in the left lung. No focal airspace consolidation. No pleural effusion. Musculoskeletal: No worrisome lytic or sclerotic osseous abnormality. CT ABDOMEN PELVIS FINDINGS Hepatobiliary: No suspicious focal abnormality within the liver parenchyma. Gallbladder is nondistended. No intrahepatic or extrahepatic biliary dilation. Pancreas: No  focal mass lesion. No dilatation of the main duct. No intraparenchymal cyst. No peripancreatic edema. Spleen: No splenomegaly. No focal mass lesion.  Adrenals/Urinary Tract: No adrenal nodule or mass. Exophytic left upper pole renal lesion measuring 2.1 represents mild increase in size of the 1.7 cm lesion characterized on abdominal MRI 01/22/2021 as suspicious for papillary renal cell carcinoma. Bilateral renal cysts noted. No evidence for hydroureter. The urinary bladder appears normal for the degree of distention. Stomach/Bowel: Stomach is unremarkable. No gastric wall thickening. No evidence of outlet obstruction. Duodenum is normally positioned as is the ligament of Treitz. No small bowel wall thickening. No small bowel dilatation. The terminal ileum is normal. The appendix is normal. No gross colonic mass. No colonic wall thickening. Vascular/Lymphatic: There is mild atherosclerotic calcification of the abdominal aorta without aneurysm. There is no gastrohepatic or hepatoduodenal ligament lymphadenopathy. No retroperitoneal or mesenteric lymphadenopathy. No pelvic sidewall lymphadenopathy. Reproductive: The prostate gland and seminal vesicles are unremarkable. Other: No intraperitoneal free fluid. Musculoskeletal: No worrisome lytic or sclerotic osseous abnormality. IMPRESSION: 1. Post treatment scarring retro hilar right lung similar to prior. 2. 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 3. Bilateral renal cysts. 4. Aortic Atherosclerosis (ICD10-I70.0). Electronically Signed   By: Misty Stanley M.D.   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 No new or progressive findings to suggest  recurrent or metastatic disease.  # proceed with Keytruda maintenance Labs today reviewed;  acceptable for treatment today.  Given the long drive patient is interested in moving his care locally in [place] area.  He will talk to his PCP regarding local oncology referral.  # Anemia-mild-13.2/suspect CKD.  iron studies-IDA. continue gentle iron; Colonoscopy- awaiting GI referral/PCP.   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  [No Biopsy]c 1.7- currently 2.1cm;- on surveillance  [s/p evaluation with urology at [facility]-/March 2023 for an MRI of the abdomen with and without contrast.   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   # DM-2;PBF- 96; MAY 2022-Hb1a C-6.5. On Metformin 500 mg BID; refilled;  monitor now- STABLE.  # DISPOSITION: later appt in AM # Keytruda today # follow up in 3 weekMD; labs- cbc/cmp; Keytruda;TSH- --Dr.B  # I reviewed the blood work- with the patient in detail; also reviewed the imaging independently [as summarized above]; and with the patient in detail.  '     All questions were answered. The patient knows to call the clinic with any problems, questions or concerns.    Cammie Sickle, MD 02/13/2022 1:59 PM

## 2022-02-13 NOTE — Progress Notes (Signed)
Pt had referral for GI in [place], that provider is retiring and no longer accepting appts. Tried to get him in with [facility], they declined to make appt.  PET results.

## 2022-02-13 NOTE — Assessment & Plan Note (Addendum)
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 No new or progressive findings to suggest recurrent or metastatic disease.  # proceed with Keytruda maintenance Labs today reviewed;  acceptable for treatment today.  Given the long drive patient is interested in moving his care locally in [place] area.  He will talk to his PCP regarding local oncology referral.  # Anemia-mild-13.2/suspect CKD.  iron studies-IDA. continue gentle iron; Colonoscopy- awaiting GI referral/PCP.   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  [No Biopsy]c 1.7- currently 2.1cm;- on surveillance  [s/p evaluation with urology at [facility]-/March 2023 for an MRI of the abdomen with and without contrast.   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   # DM-2;PBF- 96; MAY 2022-Hb1a C-6.5. On Metformin 500 mg BID; refilled;  monitor now- STABLE.  # DISPOSITION: later appt in AM # Keytruda today # follow up in 3 weekMD; labs- cbc/cmp; Keytruda;TSH- --Dr.B  # I reviewed the blood work- with the patient in detail; also reviewed the imaging independently [as summarized above]; and with the patient in detail.  '

## 2022-02-13 NOTE — Patient Instructions (Signed)
[facility] CANCER CTR AT [place]  Discharge Instructions: Thank you for choosing [facility] to provide your oncology and hematology care.  If you have a lab appointment with the [facility], please go directly to the [facility] and check in at the registration area.  Wear comfortable clothing and clothing appropriate for easy access to any Portacath or PICC line.   We strive to give you quality time with your provider. You may need to reschedule your appointment if you arrive late (15 or more minutes).  Arriving late affects you and other patients whose appointments are after yours.  Also, if you miss three or more appointments without notifying the office, you may be dismissed from the clinic at the providers discretion.      For prescription refill requests, have your pharmacy contact our office and allow 72 hours for refills to be completed.    Today you received the following chemotherapy and/or immunotherapy agents Beryle Flock     To help prevent nausea and vomiting after your treatment, we encourage you to take your nausea medication as directed.  BELOW ARE SYMPTOMS THAT SHOULD BE REPORTED IMMEDIATELY: *FEVER GREATER THAN 100.4 F (38 C) OR HIGHER *CHILLS OR SWEATING *NAUSEA AND VOMITING THAT IS NOT CONTROLLED WITH YOUR NAUSEA MEDICATION *UNUSUAL SHORTNESS OF BREATH *UNUSUAL BRUISING OR BLEEDING *URINARY PROBLEMS (pain or burning when urinating, or frequent urination) *BOWEL PROBLEMS (unusual diarrhea, constipation, pain near the anus) TENDERNESS IN MOUTH AND THROAT WITH OR WITHOUT PRESENCE OF ULCERS (sore throat, sores in mouth, or a toothache) UNUSUAL RASH, SWELLING OR PAIN  UNUSUAL VAGINAL DISCHARGE OR ITCHING   Items with * indicate a potential emergency and should be followed up as soon as possible or go to the Emergency Department if any problems should occur.  Please show the CHEMOTHERAPY ALERT CARD or IMMUNOTHERAPY ALERT CARD at check-in to the  Emergency Department and triage nurse.  Should you have questions after your visit or need to cancel or reschedule your appointment, please contact [facility] CANCER [place] AT [place]  [phone] and follow the prompts.  Office hours are 8:00 a.m. to 4:30 p.m. Monday - Friday. Please note that voicemails left after 4:00 p.m. may not be returned until the following business day.  We are closed weekends and major holidays. You have access to a nurse at all times for urgent questions. Please call the main number to the clinic [phone] and follow the prompts.  For any non-urgent questions, you may also contact your provider using MyChart. We now offer e-Visits for anyone 68 and older to request care online for non-urgent symptoms. For details visit [email or web address].   Also download the MyChart app! Go to the app store, search "MyChart", open the app, select [facility], and log in with your MyChart username and password.  Due to Covid, a mask is required upon entering the hospital/clinic. If you do not have a mask, one will be given to you upon arrival. For doctor visits, patients may have 1 support person aged 40 or older with them. For treatment visits, patients cannot have anyone with them due to current Covid guidelines and our immunocompromised population.

## 2022-03-06 ENCOUNTER — Inpatient Hospital Stay: Payer: Medicare PPO | Attending: Internal Medicine

## 2022-03-06 ENCOUNTER — Inpatient Hospital Stay: Payer: Medicare PPO | Admitting: Internal Medicine

## 2022-03-06 ENCOUNTER — Inpatient Hospital Stay: Payer: Medicare PPO

## 2022-03-06 ENCOUNTER — Encounter: Payer: Self-pay | Admitting: Internal Medicine

## 2022-03-06 ENCOUNTER — Other Ambulatory Visit: Payer: Self-pay

## 2022-03-06 DIAGNOSIS — I129 Hypertensive chronic kidney disease with stage 1 through stage 4 chronic kidney disease, or unspecified chronic kidney disease: Secondary | ICD-10-CM | POA: Insufficient documentation

## 2022-03-06 DIAGNOSIS — I2510 Atherosclerotic heart disease of native coronary artery without angina pectoris: Secondary | ICD-10-CM | POA: Insufficient documentation

## 2022-03-06 DIAGNOSIS — E1122 Type 2 diabetes mellitus with diabetic chronic kidney disease: Secondary | ICD-10-CM | POA: Insufficient documentation

## 2022-03-06 DIAGNOSIS — C3431 Malignant neoplasm of lower lobe, right bronchus or lung: Secondary | ICD-10-CM | POA: Insufficient documentation

## 2022-03-06 DIAGNOSIS — D4100 Neoplasm of uncertain behavior of unspecified kidney: Secondary | ICD-10-CM | POA: Diagnosis not present

## 2022-03-06 DIAGNOSIS — Z79899 Other long term (current) drug therapy: Secondary | ICD-10-CM | POA: Diagnosis not present

## 2022-03-06 DIAGNOSIS — C7951 Secondary malignant neoplasm of bone: Secondary | ICD-10-CM | POA: Diagnosis not present

## 2022-03-06 DIAGNOSIS — Z5111 Encounter for antineoplastic chemotherapy: Secondary | ICD-10-CM | POA: Diagnosis not present

## 2022-03-06 DIAGNOSIS — C7970 Secondary malignant neoplasm of unspecified adrenal gland: Secondary | ICD-10-CM | POA: Diagnosis not present

## 2022-03-06 DIAGNOSIS — Z7984 Long term (current) use of oral hypoglycemic drugs: Secondary | ICD-10-CM | POA: Diagnosis not present

## 2022-03-06 DIAGNOSIS — K219 Gastro-esophageal reflux disease without esophagitis: Secondary | ICD-10-CM | POA: Diagnosis not present

## 2022-03-06 DIAGNOSIS — N183 Chronic kidney disease, stage 3 unspecified: Secondary | ICD-10-CM | POA: Diagnosis not present

## 2022-03-06 DIAGNOSIS — D649 Anemia, unspecified: Secondary | ICD-10-CM | POA: Diagnosis not present

## 2022-03-06 DIAGNOSIS — Z7982 Long term (current) use of aspirin: Secondary | ICD-10-CM | POA: Insufficient documentation

## 2022-03-06 DIAGNOSIS — Z87891 Personal history of nicotine dependence: Secondary | ICD-10-CM | POA: Diagnosis not present

## 2022-03-06 DIAGNOSIS — C771 Secondary and unspecified malignant neoplasm of intrathoracic lymph nodes: Secondary | ICD-10-CM | POA: Insufficient documentation

## 2022-03-06 DIAGNOSIS — N529 Male erectile dysfunction, unspecified: Secondary | ICD-10-CM | POA: Insufficient documentation

## 2022-03-06 LAB — COMPREHENSIVE METABOLIC PANEL
ALT: 29 U/L (ref 0–44)
AST: 28 U/L (ref 15–41)
Albumin: 3.6 g/dL (ref 3.5–5.0)
Alkaline Phosphatase: 81 U/L (ref 38–126)
Anion gap: 8 (ref 5–15)
BUN: 28 mg/dL — ABNORMAL HIGH (ref 8–23)
CO2: 23 mmol/L (ref 22–32)
Calcium: 8.7 mg/dL — ABNORMAL LOW (ref 8.9–10.3)
Chloride: 102 mmol/L (ref 98–111)
Creatinine, Ser: 1.43 mg/dL — ABNORMAL HIGH (ref 0.61–1.24)
GFR, Estimated: 50 mL/min — ABNORMAL LOW (ref >60)
Glucose, Bld: 94 mg/dL (ref 70–99)
Potassium: 4.1 mmol/L (ref 3.5–5.1)
Sodium: 133 mmol/L — ABNORMAL LOW (ref 135–145)
Total Bilirubin: 0.5 mg/dL (ref 0.3–1.2)
Total Protein: 6.8 g/dL (ref 6.5–8.1)

## 2022-03-06 LAB — TSH: TSH: 1.051 u[IU]/mL (ref 0.350–4.500)

## 2022-03-06 LAB — CBC WITH DIFFERENTIAL/PLATELET
Abs Immature Granulocytes: 0.01 10*3/uL (ref 0.00–0.07)
Basophils Absolute: 0 10*3/uL (ref 0.0–0.1)
Basophils Relative: 0 %
Eosinophils Absolute: 0.1 10*3/uL (ref 0.0–0.5)
Eosinophils Relative: 2 %
HCT: 41.4 % (ref 39.0–52.0)
Hemoglobin: 13.9 g/dL (ref 13.0–17.0)
Immature Granulocytes: 0 %
Lymphocytes Relative: 46 %
Lymphs Abs: 2.8 10*3/uL (ref 0.7–4.0)
MCH: 30.3 pg (ref 26.0–34.0)
MCHC: 33.6 g/dL (ref 30.0–36.0)
MCV: 90.2 fL (ref 80.0–100.0)
Monocytes Absolute: 0.5 10*3/uL (ref 0.1–1.0)
Monocytes Relative: 8 %
Neutro Abs: 2.7 10*3/uL (ref 1.7–7.7)
Neutrophils Relative %: 44 %
Platelets: 163 10*3/uL (ref 150–400)
RBC: 4.59 MIL/uL (ref 4.22–5.81)
RDW: 15 % (ref 11.5–15.5)
WBC: 6.2 10*3/uL (ref 4.0–10.5)
nRBC: 0 % (ref 0.0–0.2)

## 2022-03-06 MED ORDER — HEPARIN SOD (PORK) LOCK FLUSH 100 UNIT/ML IV SOLN
INTRAVENOUS | Status: AC
  Administered 2022-03-06: 500 [IU]
  Filled 2022-03-06: qty 5

## 2022-03-06 MED ORDER — PROCHLORPERAZINE MALEATE 10 MG PO TABS
10.0000 mg | ORAL_TABLET | Freq: Once | ORAL | Status: DC
  Filled 2022-03-06: qty 1

## 2022-03-06 MED ORDER — SODIUM CHLORIDE 0.9 % IV SOLN
Freq: Once | INTRAVENOUS | Status: AC
  Filled 2022-03-06: qty 250

## 2022-03-06 MED ORDER — SODIUM CHLORIDE 0.9 % IV SOLN
200.0000 mg | Freq: Once | INTRAVENOUS | Status: AC
  Administered 2022-03-06: 200 mg via INTRAVENOUS
  Filled 2022-03-06: qty 200

## 2022-03-06 MED ORDER — HEPARIN SOD (PORK) LOCK FLUSH 100 UNIT/ML IV SOLN
500.0000 [IU] | Freq: Once | INTRAVENOUS | Status: AC | PRN
  Filled 2022-03-06: qty 5

## 2022-03-06 NOTE — Assessment & Plan Note (Addendum)
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 No new or progressive findings to suggest recurrent or metastatic disease. ? ?# proceed with Keytruda maintenance Labs today reviewed;  acceptable for treatment today.  Patient is meeting with his primary care physician next week; to get a referral to a local oncology/in [place].  ? ?# Anemia-mild-13.2/suspect CKD.  iron studies-IDA. continue gentle iron; Colonoscopy- awaiting GI referral/PCP.  ? 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  [No Biopsy]c 1.7- currently 2.2cm;- on surveillance  [s/p evaluation with urology at [facility]-/March 2023 for an MRI of the abdomen with and without contrast. Exophytic mass anterior upper pole left kidney measuring 2.1 x 2.2 cm has mildly increased in size since the 2022 exam where dimensions were 1.8 x 1.8 cm.  ? 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  ? ?# DM-2;PBF- 133; MAY 2022-Hb1a C-6.5. On Metformin 500 mg BID; refilled;  monitor now- STABLE. ? ?# DISPOSITION: later appt in AM ?# Keytruda today ?# follow up in 3 weeks- X-NP-MD; labs- cbc/cmp; Keytruda;--Dr.B ? ? ? ? ?

## 2022-03-06 NOTE — Patient Instructions (Signed)
[facility] CANCER CTR AT [place]  Discharge Instructions: ?Thank you for choosing [facility] to provide your oncology and hematology care.  ?If you have a lab appointment with the [facility], please go directly to the [facility] and check in at the registration area. ? ?Wear comfortable clothing and clothing appropriate for easy access to any Portacath or PICC line.  ? ?We strive to give you quality time with your provider. You may need to reschedule your appointment if you arrive late (15 or more minutes).  Arriving late affects you and other patients whose appointments are after yours.  Also, if you miss three or more appointments without notifying the office, you may be dismissed from the clinic at the provider?s discretion.    ?  ?For prescription refill requests, have your pharmacy contact our office and allow 72 hours for refills to be completed.   ? ?Today you received the following chemotherapy and/or immunotherapy agents KEYTRUDA    ?  ?To help prevent nausea and vomiting after your treatment, we encourage you to take your nausea medication as directed. ? ?BELOW ARE SYMPTOMS THAT SHOULD BE REPORTED IMMEDIATELY: ?*FEVER GREATER THAN 100.4 F (38 ?C) OR HIGHER ?*CHILLS OR SWEATING ?*NAUSEA AND VOMITING THAT IS NOT CONTROLLED WITH YOUR NAUSEA MEDICATION ?*UNUSUAL SHORTNESS OF BREATH ?*UNUSUAL BRUISING OR BLEEDING ?*URINARY PROBLEMS (pain or burning when urinating, or frequent urination) ?*BOWEL PROBLEMS (unusual diarrhea, constipation, pain near the anus) ?TENDERNESS IN MOUTH AND THROAT WITH OR WITHOUT PRESENCE OF ULCERS (sore throat, sores in mouth, or a toothache) ?UNUSUAL RASH, SWELLING OR PAIN  ?UNUSUAL VAGINAL DISCHARGE OR ITCHING  ? ?Items with * indicate a potential emergency and should be followed up as soon as possible or go to the Emergency Department if any problems should occur. ? ?Please show the CHEMOTHERAPY ALERT CARD or IMMUNOTHERAPY ALERT CARD at check-in to  the Emergency Department and triage nurse. ? ?Should you have questions after your visit or need to cancel or reschedule your appointment, please contact [facility] CANCER [place] AT [place]  [phone] and follow the prompts.  Office hours are 8:00 a.m. to 4:30 p.m. Monday - Friday. Please note that voicemails left after 4:00 p.m. may not be returned until the following business day.  We are closed weekends and major holidays. You have access to a nurse at all times for urgent questions. Please call the main number to the clinic [phone] and follow the prompts. ? ?For any non-urgent questions, you may also contact your provider using MyChart. We now offer e-Visits for anyone 28 and older to request care online for non-urgent symptoms. For details visit [email or web address]. ?  ?Also download the MyChart app! Go to the app store, search "MyChart", open the app, select [facility], and log in with your MyChart username and password. ? ?Due to Covid, a mask is required upon entering the hospital/clinic. If you do not have a mask, one will be given to you upon arrival. For doctor visits, patients may have 1 support person aged 62 or older with them. For treatment visits, patients cannot have anyone with them due to current Covid guidelines and our immunocompromised population.  ? ?Pembrolizumab injection ?What is this medication? ?PEMBROLIZUMAB (pem broe liz ue mab) is a monoclonal antibody. It is used to treat certain types of cancer. ?This medicine may be used for other purposes; ask your health care provider or pharmacist if you have questions. ?COMMON BRAND NAME(S): Keytruda ?What should I tell my care team  before I take this medication? ?They need to know if you have any of these conditions: ?autoimmune diseases like Crohn's disease, ulcerative colitis, or lupus ?have had or planning to have an allogeneic stem cell transplant (uses someone else's stem cells) ?history of organ transplant ?history  of chest radiation ?nervous system problems like myasthenia gravis or Guillain-Barre syndrome ?an unusual or allergic reaction to pembrolizumab, other medicines, foods, dyes, or preservatives ?pregnant or trying to get pregnant ?breast-feeding ?How should I use this medication? ?This medicine is for infusion into a vein. It is given by a health care professional in a hospital or clinic setting. ?A special MedGuide will be given to you before each treatment. Be sure to read this information carefully each time. ?Talk to your pediatrician regarding the use of this medicine in children. While this drug may be prescribed for children as young as 6 months for selected conditions, precautions do apply. ?Overdosage: If you think you have taken too much of this medicine contact a poison control center or emergency room at once. ?NOTE: This medicine is only for you. Do not share this medicine with others. ?What if I miss a dose? ?It is important not to miss your dose. Call your doctor or health care professional if you are unable to keep an appointment. ?What may interact with this medication? ?Interactions have not been studied. ?This list may not describe all possible interactions. Give your health care provider a list of all the medicines, herbs, non-prescription drugs, or dietary supplements you use. Also tell them if you smoke, drink alcohol, or use illegal drugs. Some items may interact with your medicine. ?What should I watch for while using this medication? ?Your condition will be monitored carefully while you are receiving this medicine. ?You may need blood work done while you are taking this medicine. ?Do not become pregnant while taking this medicine or for 4 months after stopping it. Women should inform their doctor if they wish to become pregnant or think they might be pregnant. There is a potential for serious side effects to an unborn child. Talk to your health care professional or pharmacist for more  information. Do not breast-feed an infant while taking this medicine or for 4 months after the last dose. ?What side effects may I notice from receiving this medication? ?Side effects that you should report to your doctor or health care professional as soon as possible: ?allergic reactions like skin rash, itching or hives, swelling of the face, lips, or tongue ?bloody or black, tarry ?breathing problems ?changes in vision ?chest pain ?chills ?confusion ?constipation ?cough ?diarrhea ?dizziness or feeling faint or lightheaded ?fast or irregular heartbeat ?fever ?flushing ?joint pain ?low blood counts - this medicine may decrease the number of white blood cells, red blood cells and platelets. You may be at increased risk for infections and bleeding. ?muscle pain ?muscle weakness ?pain, tingling, numbness in the hands or feet ?persistent headache ?redness, blistering, peeling or loosening of the skin, including inside the mouth ?signs and symptoms of high blood sugar such as dizziness; dry mouth; dry skin; fruity breath; nausea; stomach pain; increased hunger or thirst; increased urination ?signs and symptoms of kidney injury like trouble passing urine or change in the amount of urine ?signs and symptoms of liver injury like dark urine, light-colored stools, loss of appetite, nausea, right upper belly pain, yellowing of the eyes or skin ?sweating ?swollen lymph nodes ?weight loss ?Side effects that usually do not require medical attention (report to your doctor or  health care professional if they continue or are bothersome): ?decreased appetite ?hair loss ?tiredness ?This list may not describe all possible side effects. Call your doctor for medical advice about side effects. You may report side effects to FDA at 1-800-FDA-1088. ?Where should I keep my medication? ?This drug is given in a hospital or clinic and will not be stored at home. ?NOTE: This sheet is a summary. It may not cover all possible information. If you  have questions about this medicine, talk to your doctor, pharmacist, or health care provider. ?? 2022 Elsevier/Gold Standard (2021-08-26 00:00:00) ? ?

## 2022-03-06 NOTE — Progress Notes (Signed)
[name] ? ?Patient Care Team: ?Cox, Okey Regal, MD as PCP - General (Family Medicine) ?Telford Nab, RN as Sales executive ? ?CHIEF COMPLAINTS/PURPOSE OF CONSULTATION: lung cancer ? ?#  ?Oncology History Overview Note  ?# July 2021- RLL- 9.4 cm x 7.5 cm x 6.7 cm lobulated low-attenuation heterogeneous lung mass within the posteromedial aspect of the right lower lobe, at the level of the right hilum; positive for hilar; mediastinal adenopathy; PET scan-July 2021 + for adrenal metastases; CT-guided biopsy-non-small cell favor adenocarcinoma; necrosis ? ?# carbo-alimtac  [name]; NGS pending] cycle #2 carbo Alimta Keytruda; 10/18/2020-maintenance Keytruda ? ?# 4 mm cervical sp[ine vertebral body-indeterminate lesion monitor for now ? ?# CAD- 1995 s/p cath; No stents; # ? CKD ? ?# NGS/MOLECULAR TESTS: TPS =70%; rest ** ? ?# PALLIATIVE CARE EVALUATION:  ? ? ? ?DIAGNOSIS: Lung cancer ? ?STAGE: 4       ;  GOALS: Palliative ? ?CURRENT/MOST RECENT THERAPY : Carbo Alimta Keytruda ? ?  ?Cancer of lower lobe of right lung ([place])  ?07/05/2020 Initial Diagnosis  ? Cancer of lower lobe of right lung [facility]) ?  ?07/26/2020 -  Chemotherapy  ? Patient is on Treatment Plan : LUNG CARBOplatin / Pemetrexed / Pembrolizumab q21d Induction x 4 cycles / Maintenance Pemetrexed + Pembrolizumab  ?   ? ? ?HISTORY OF PRESENTING ILLNESS: Ambulating independently.  Accompanied by his wife. ? ?Ralph Williams 81 y.o.  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 ?Just returned from trip from [place]  ? ?No nausea no vomiting.  No cough.  No chest pain.  No headaches.  Denies any fatigue.  No constipation no diarrhea. ? ?Review of Systems  ?Constitutional:  Negative for chills, diaphoresis, fever, malaise/fatigue and weight loss.  ?HENT:  Negative for nosebleeds and sore throat.   ?Eyes:  Negative for double vision.   ?Respiratory:  Negative for hemoptysis, sputum production and shortness of breath.   ?Cardiovascular:  Negative for chest pain, palpitations, orthopnea and leg swelling.  ?Gastrointestinal:  Negative for abdominal pain, blood in stool, constipation, diarrhea, heartburn, melena, nausea and vomiting.  ?Genitourinary:  Negative for dysuria, frequency and urgency.  ?Musculoskeletal:  Negative for back pain and joint pain.  ?Neurological:  Negative for dizziness, tingling, focal weakness, weakness and headaches.  ?Endo/Heme/Allergies:  Does not bruise/bleed easily.  ?Psychiatric/Behavioral:  Negative for depression. The patient is not nervous/anxious.    ? ?MEDICAL HISTORY:  ?Past Medical History:  ?Diagnosis Date  ? Chronic renal insufficiency   ? Diabetes mellitus without complication ([facility])   ? Pt states it's diet controlled.  ? GERD (gastroesophageal reflux disease)   ? Hyperlipidemia associated with type 2 diabetes mellitus ([facility])   ? Hypertension   ? Lung mass   ? Non-small cell lung cancer, right ([facility]) 06/2020  ? ? ?SURGICAL HISTORY: ?Past Surgical History:  ?Procedure Laterality Date  ? IR IMAGING GUIDED PORT INSERTION  07/22/2020  ? 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 ? ?SOCIAL HISTORY: ?Social History  ? ?Socioeconomic History  ? Marital status: Married  ?  Spouse name: Not on file  ? Number of children: Not on file  ? Years of education: Not on file  ? Highest education level: Not on file  ?Occupational History  ? Not on file  ?Tobacco Use  ? Smoking status: Former  ?  Types: Cigarettes  ?  Quit date:  07/23/2007  ?  Years since quitting: 14.6  ? Smokeless tobacco: Never  ?Vaping Use  ? Vaping Use: Never used  ?Substance and Sexual Activity  ? Alcohol use: Not Currently  ?  Comment: none for 5 months  ? Drug use: Never  ? Sexual activity: Not on file  ?Other Topics Concern  ? Not on file  ?Social History Narrative  ? Moved from [place]; lives in [place] in 2021; daughter lives next door. Quit smoking- 13 years ago.  Social alcohol. 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   ? ?Social Determinants of Health  ? ?Financial Resource Strain: Not on file  ?Food Insecurity: Not on file  ?Transportation Needs: Not on file  ?Physical Activity: Not on file  ?Stress: Not on file  ?Social Connections: Not on file  ?Intimate Partner Violence: Not on file  ? ? ?FAMILY HISTORY: ?Family History  ?Problem Relation Age of Onset  ? Liver cancer Sister   ? Cancer - Other Brother   ?     bone cancer [2001]  ? ? ?ALLERGIES:  is allergic to niacin and related. ? ?MEDICATIONS:  ?Current Outpatient Medications  ?Medication Sig Dispense Refill  ? acetaminophen (TYLENOL) 500 MG tablet Take 2 tablets by mouth every 8 (eight) hours as needed.    ? aspirin 325 MG EC tablet Take 1 tablet by mouth daily.    ? atenolol (TENORMIN) 25 MG tablet Take 1 tablet by mouth daily.    ? atorvastatin (LIPITOR) 40 MG tablet Take 1 tablet by mouth daily.    ? hydrochlorothiazide (HYDRODIURIL) 12.5 MG tablet Take 12.5 mg by mouth daily.    ? lidocaine-prilocaine (EMLA) cream Apply 1 application topically as needed. 30 g 4  ? losartan (COZAAR) 50 MG tablet Take 1 tablet by mouth daily.    ? Melatonin 10 MG TABS Take 1 tablet by mouth at bedtime.    ? metFORMIN (GLUCOPHAGE) 500 MG tablet TAKE ONE TABLET BY MOUTH TWICE A DAY WITH A MEAL 60 tablet 3  ? Omega-3 1000 MG CAPS Take 1,000 mg by mouth in the morning and at bedtime.    ? omeprazole (PRILOSEC) 20 MG capsule Take 20 mg by mouth in the morning and at bedtime.    ? tadalafil (CIALIS) 20 MG tablet Take 1 tablet by mouth daily.    ? ondansetron (ZOFRAN) 8 MG tablet One pill every 8 hours as needed for nausea/vomitting. (Patient not taking: Reported on 03/06/2022) 40 tablet 1  ? prochlorperazine (COMPAZINE) 10 MG tablet Take 1 tablet (10 mg total) by mouth every 6 (six) hours as needed for nausea or vomiting. (Patient not taking: Reported on 03/06/2022) 40 tablet 1  ? ?No current facility-administered  medications for this visit.  ? ?Facility-Administered Medications Ordered in Other Visits  ?Medication Dose Route Frequency Provider Last Rate Last Admin  ? heparin lock flush 100 unit/mL  500 Units Intracatheter Once PRN Cammie Sickle, MD      ? prochlorperazine (COMPAZINE) tablet 10 mg  10 mg Oral Once Cammie Sickle, MD      ? prochlorperazine (COMPAZINE) tablet 10 mg  10 mg Oral Once Cammie Sickle, MD      ? ? ?  ?. ? ?PHYSICAL EXAMINATION: ?ECOG PERFORMANCE STATUS: 1 - Symptomatic but completely ambulatory ? ?Vitals:  ? 03/06/22 1300  ?BP: (!) 112/56  ?Pulse: (!) 58  ?Resp: 18  ?Temp: (!) 97.1 ?F (36.2 ?C)  ?SpO2: 96%  ? ?Filed Weights  ?  03/06/22 1300  ?Weight: 245 lb 8 oz (111.4 kg)  ? ? ? ?Physical Exam ?Constitutional:   ?   Comments: Ambulating independently.  Accompanied by his wife.   ?HENT:  ?   Head: Normocephalic and atraumatic.  ?   Mouth/Throat:  ?   Pharynx: No oropharyngeal exudate.  ?Eyes:  ?   Pupils: Pupils are equal, round, and reactive to light.  ?Cardiovascular:  ?   Rate and Rhythm: Normal rate and regular rhythm.  ?Pulmonary:  ?   Effort: Pulmonary effort is normal. No respiratory distress.  ?   Breath sounds: Normal breath sounds. No wheezing.  ?Abdominal:  ?   General: Bowel sounds are normal. There is no distension.  ?   Palpations: Abdomen is soft. There is no mass.  ?   Tenderness: There is no abdominal tenderness. There is no guarding or rebound.  ?Musculoskeletal:     ?   General: No tenderness. Normal range of motion.  ?   Cervical back: Normal range of motion and neck supple.  ?Skin: ?   General: Skin is warm.  ?Neurological:  ?   Mental Status: He is alert and oriented to person, place, and time.  ?Psychiatric:     ?   Mood and Affect: Affect normal.  ? ? ? ?LABORATORY DATA:  ?I have reviewed the data as listed ?Lab Results  ?Component Value Date  ? WBC 6.2 03/06/2022  ? HGB 13.9 03/06/2022  ? HCT 41.4 03/06/2022  ? MCV 90.2 03/06/2022  ? PLT 163 03/06/2022   ? ?Recent Labs  ?  01/23/22 ?1230 02/13/22 ?1011 03/06/22 ?1212  ?NA 134* 135 133*  ?K 4.1 3.9 4.1  ?CL 104 104 102  ?CO2 24 24 23   ?GLUCOSE 104* 96 94  ?BUN 25* 28* 28*  ?CREATININE 1.20 1.38* 1.43*  ?CALCIUM 8.4*

## 2022-03-27 ENCOUNTER — Inpatient Hospital Stay: Payer: Medicare PPO | Attending: Oncology

## 2022-03-27 ENCOUNTER — Inpatient Hospital Stay: Payer: Medicare PPO

## 2022-03-27 ENCOUNTER — Ambulatory Visit: Payer: Medicare PPO

## 2022-03-27 ENCOUNTER — Encounter: Payer: Self-pay | Admitting: Nurse Practitioner

## 2022-03-27 ENCOUNTER — Inpatient Hospital Stay: Payer: Medicare PPO | Admitting: Nurse Practitioner

## 2022-03-27 ENCOUNTER — Ambulatory Visit: Payer: Medicare PPO | Admitting: Nurse Practitioner

## 2022-03-27 ENCOUNTER — Other Ambulatory Visit: Payer: Medicare PPO

## 2022-03-27 VITALS — BP 117/60 | HR 54 | Temp 97.8°F | Ht 67.0 in | Wt 247.4 lb

## 2022-03-27 DIAGNOSIS — D649 Anemia, unspecified: Secondary | ICD-10-CM | POA: Diagnosis not present

## 2022-03-27 DIAGNOSIS — E1122 Type 2 diabetes mellitus with diabetic chronic kidney disease: Secondary | ICD-10-CM | POA: Insufficient documentation

## 2022-03-27 DIAGNOSIS — C3431 Malignant neoplasm of lower lobe, right bronchus or lung: Secondary | ICD-10-CM | POA: Insufficient documentation

## 2022-03-27 DIAGNOSIS — Z5112 Encounter for antineoplastic immunotherapy: Secondary | ICD-10-CM | POA: Diagnosis present

## 2022-03-27 DIAGNOSIS — N183 Chronic kidney disease, stage 3 unspecified: Secondary | ICD-10-CM | POA: Diagnosis not present

## 2022-03-27 DIAGNOSIS — N1831 Chronic kidney disease, stage 3a: Secondary | ICD-10-CM | POA: Diagnosis not present

## 2022-03-27 DIAGNOSIS — Z85528 Personal history of other malignant neoplasm of kidney: Secondary | ICD-10-CM | POA: Insufficient documentation

## 2022-03-27 DIAGNOSIS — Z79899 Other long term (current) drug therapy: Secondary | ICD-10-CM | POA: Insufficient documentation

## 2022-03-27 DIAGNOSIS — C642 Malignant neoplasm of left kidney, except renal pelvis: Secondary | ICD-10-CM | POA: Diagnosis not present

## 2022-03-27 DIAGNOSIS — C7970 Secondary malignant neoplasm of unspecified adrenal gland: Secondary | ICD-10-CM | POA: Insufficient documentation

## 2022-03-27 LAB — COMPREHENSIVE METABOLIC PANEL
ALT: 24 U/L (ref 0–44)
AST: 23 U/L (ref 15–41)
Albumin: 3.6 g/dL (ref 3.5–5.0)
Alkaline Phosphatase: 67 U/L (ref 38–126)
Anion gap: 5 (ref 5–15)
BUN: 32 mg/dL — ABNORMAL HIGH (ref 8–23)
CO2: 23 mmol/L (ref 22–32)
Calcium: 8.3 mg/dL — ABNORMAL LOW (ref 8.9–10.3)
Chloride: 107 mmol/L (ref 98–111)
Creatinine, Ser: 1.55 mg/dL — ABNORMAL HIGH (ref 0.61–1.24)
GFR, Estimated: 45 mL/min — ABNORMAL LOW (ref >60)
Glucose, Bld: 100 mg/dL — ABNORMAL HIGH (ref 70–99)
Potassium: 3.8 mmol/L (ref 3.5–5.1)
Sodium: 135 mmol/L (ref 135–145)
Total Bilirubin: 0.4 mg/dL (ref 0.3–1.2)
Total Protein: 6.5 g/dL (ref 6.5–8.1)

## 2022-03-27 LAB — CBC WITH DIFFERENTIAL/PLATELET
Abs Immature Granulocytes: 0.01 10*3/uL (ref 0.00–0.07)
Basophils Absolute: 0 10*3/uL (ref 0.0–0.1)
Basophils Relative: 0 %
Eosinophils Absolute: 0.1 10*3/uL (ref 0.0–0.5)
Eosinophils Relative: 2 %
HCT: 40.1 % (ref 39.0–52.0)
Hemoglobin: 13.3 g/dL (ref 13.0–17.0)
Immature Granulocytes: 0 %
Lymphocytes Relative: 43 %
Lymphs Abs: 2.2 10*3/uL (ref 0.7–4.0)
MCH: 30 pg (ref 26.0–34.0)
MCHC: 33.2 g/dL (ref 30.0–36.0)
MCV: 90.5 fL (ref 80.0–100.0)
Monocytes Absolute: 0.5 10*3/uL (ref 0.1–1.0)
Monocytes Relative: 9 %
Neutro Abs: 2.4 10*3/uL (ref 1.7–7.7)
Neutrophils Relative %: 46 %
Platelets: 147 10*3/uL — ABNORMAL LOW (ref 150–400)
RBC: 4.43 MIL/uL (ref 4.22–5.81)
RDW: 14.8 % (ref 11.5–15.5)
WBC: 5.2 10*3/uL (ref 4.0–10.5)
nRBC: 0 % (ref 0.0–0.2)

## 2022-03-27 LAB — TSH: TSH: 0.915 u[IU]/mL (ref 0.350–4.500)

## 2022-03-27 MED ORDER — HEPARIN SOD (PORK) LOCK FLUSH 100 UNIT/ML IV SOLN
INTRAVENOUS | Status: AC
  Filled 2022-03-27: qty 5

## 2022-03-27 MED ORDER — PROCHLORPERAZINE MALEATE 10 MG PO TABS: 10.0000 mg | ORAL_TABLET | Freq: Once | ORAL | Status: DC

## 2022-03-27 MED ORDER — SODIUM CHLORIDE 0.9 % IV SOLN
Freq: Once | INTRAVENOUS | Status: AC
  Filled 2022-03-27: qty 250

## 2022-03-27 MED ORDER — SODIUM CHLORIDE 0.9 % IV SOLN
200.0000 mg | Freq: Once | INTRAVENOUS | Status: AC
  Administered 2022-03-27: 200 mg via INTRAVENOUS
  Filled 2022-03-27: qty 200

## 2022-03-27 MED ORDER — HEPARIN SOD (PORK) LOCK FLUSH 100 UNIT/ML IV SOLN
500.0000 [IU] | Freq: Once | INTRAVENOUS | Status: AC | PRN
  Administered 2022-03-27: 500 [IU]
  Filled 2022-03-27: qty 5

## 2022-03-27 NOTE — Progress Notes (Signed)
Creatinine 1.55, Per Gust Rung RN per Beckey Rutter okay to proceed with [facility].  ? ?

## 2022-03-27 NOTE — Patient Instructions (Signed)
[facility] CANCER CTR AT [place]  Discharge Instructions: ?Thank you for choosing [facility] to provide your oncology and hematology care.  ?If you have a lab appointment with the [facility], please go directly to the [facility] and check in at the registration area. ? ?Wear comfortable clothing and clothing appropriate for easy access to any Portacath or PICC line.  ? ?We strive to give you quality time with your provider. You may need to reschedule your appointment if you arrive late (15 or more minutes).  Arriving late affects you and other patients whose appointments are after yours.  Also, if you miss three or more appointments without notifying the office, you may be dismissed from the clinic at the provider?s discretion.    ?  ?For prescription refill requests, have your pharmacy contact our office and allow 72 hours for refills to be completed.   ? ?Today you received the following chemotherapy and/or immunotherapy agents Keytruda    ?  ?To help prevent nausea and vomiting after your treatment, we encourage you to take your nausea medication as directed. ? ?BELOW ARE SYMPTOMS THAT SHOULD BE REPORTED IMMEDIATELY: ?*FEVER GREATER THAN 100.4 F (38 ?C) OR HIGHER ?*CHILLS OR SWEATING ?*NAUSEA AND VOMITING THAT IS NOT CONTROLLED WITH YOUR NAUSEA MEDICATION ?*UNUSUAL SHORTNESS OF BREATH ?*UNUSUAL BRUISING OR BLEEDING ?*URINARY PROBLEMS (pain or burning when urinating, or frequent urination) ?*BOWEL PROBLEMS (unusual diarrhea, constipation, pain near the anus) ?TENDERNESS IN MOUTH AND THROAT WITH OR WITHOUT PRESENCE OF ULCERS (sore throat, sores in mouth, or a toothache) ?UNUSUAL RASH, SWELLING OR PAIN  ?UNUSUAL VAGINAL DISCHARGE OR ITCHING  ? ?Items with * indicate a potential emergency and should be followed up as soon as possible or go to the Emergency Department if any problems should occur. ? ?Please show the CHEMOTHERAPY ALERT CARD or IMMUNOTHERAPY ALERT CARD at check-in to  the Emergency Department and triage nurse. ? ?Should you have questions after your visit or need to cancel or reschedule your appointment, please contact [facility] CANCER [place] AT [place]  [phone] and follow the prompts.  Office hours are 8:00 a.m. to 4:30 p.m. Monday - Friday. Please note that voicemails left after 4:00 p.m. may not be returned until the following business day.  We are closed weekends and major holidays. You have access to a nurse at all times for urgent questions. Please call the main number to the clinic [phone] and follow the prompts. ? ?For any non-urgent questions, you may also contact your provider using MyChart. We now offer e-Visits for anyone 71 and older to request care online for non-urgent symptoms. For details visit [email or web address]. ?  ?Also download the MyChart app! Go to the app store, search "MyChart", open the app, select [facility], and log in with your MyChart username and password. ? ?Due to Covid, a mask is required upon entering the hospital/clinic. If you do not have a mask, one will be given to you upon arrival. For doctor visits, patients may have 1 support person aged 61 or older with them. For treatment visits, patients cannot have anyone with them due to current Covid guidelines and our immunocompromised population.  ?

## 2022-03-27 NOTE — Progress Notes (Signed)
[facility] ?CONSULT NOTE ? ?Patient Care Team: ?Cox, Okey Regal, MD as PCP - General (Family Medicine) ?Telford Nab, RN as Sales executive ? ?CHIEF COMPLAINTS/PURPOSE OF CONSULTATION: lung cancer ? ?#  ?Oncology History Overview Note  ?# July 2021- RLL- 9.4 cm x 7.5 cm x 6.7 cm lobulated low-attenuation heterogeneous lung mass within the posteromedial aspect of the right lower lobe, at the level of the right hilum; positive for hilar; mediastinal adenopathy; PET scan-July 2021 + for adrenal metastases; CT-guided biopsy-non-small cell favor adenocarcinoma; necrosis ? ?# carbo-alimtac  [name]; NGS pending] cycle #2 carbo Alimta Keytruda; 10/18/2020-maintenance Keytruda ? ?# 4 mm cervical sp[ine vertebral body-indeterminate lesion monitor for now ? ?# CAD- 1995 s/p cath; No stents; # ? CKD ? ?# NGS/MOLECULAR TESTS: TPS =70%; rest ** ? ?# PALLIATIVE CARE EVALUATION:  ? ? ? ?DIAGNOSIS: Lung cancer ? ?STAGE: 4       ;  GOALS: Palliative ? ?CURRENT/MOST RECENT THERAPY : Carbo Alimta Keytruda ? ?  ?Cancer of lower lobe of right lung ([place])  ?07/05/2020 Initial Diagnosis  ? Cancer of lower lobe of right lung [facility]) ?  ?07/26/2020 -  Chemotherapy  ? Patient is on Treatment Plan : LUNG CARBOplatin / Pemetrexed / Pembrolizumab q21d Induction x 4 cycles / Maintenance Pemetrexed + Pembrolizumab  ?   ? ? ?HISTORY OF PRESENTING ILLNESS: Ambulating independently.  Accompanied by his wife. ? ?Ralph Williams 81 y.o.  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 Continues to feel well. No nausea, vomiting, diarrhea, rash, cough, chest pain, headaches, fatigue. He has moved to [place] and established care with oncology there. He plans to receive one more treatment in [place] after today then start treatment in [place]. Oncology in [place] discussed option of  receiving treatment at 6 week interval which patient is interested in due to plans for future travel.  ? ?Review of Systems  ?Constitutional:  Negative for chills, diaphoresis, fever, malaise/fatigue and weight loss.  ?HENT:  Negative for nosebleeds and sore throat.   ?Eyes:  Negative for double vision.  ?Respiratory:  Negative for hemoptysis, sputum production and shortness of breath.   ?Cardiovascular:  Negative for chest pain, palpitations, orthopnea and leg swelling.  ?Gastrointestinal:  Negative for abdominal pain, blood in stool, constipation, diarrhea, heartburn, melena, nausea and vomiting.  ?Genitourinary:  Negative for dysuria, frequency and urgency.  ?Musculoskeletal:  Negative for back pain and joint pain.  ?Neurological:  Negative for dizziness, tingling, focal weakness, weakness and headaches.  ?Endo/Heme/Allergies:  Does not bruise/bleed easily.  ?Psychiatric/Behavioral:  Negative for depression. The patient is not nervous/anxious.    ? ? ?MEDICAL HISTORY:  ?Past Medical History:  ?Diagnosis Date  ? Chronic renal insufficiency   ? Diabetes mellitus without complication ([place])   ? Pt states it's diet controlled.  ? GERD (gastroesophageal reflux disease)   ? Hyperlipidemia associated with type 2 diabetes mellitus ([place])   ? Hypertension   ? Lung mass   ? Non-small cell lung cancer, right ([place]) 06/2020  ? ? ?SURGICAL HISTORY: ?Past Surgical History:  ?Procedure Laterality Date  ? IR IMAGING GUIDED PORT INSERTION  07/22/2020  ? 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 ? ?SOCIAL HISTORY: ?Social History  ? ?Socioeconomic History  ? Marital status: Married  ?  Spouse name: Not on file  ? Number of children: Not on file  ?  Years of education: Not on file  ? Highest education level: Not on file  ?Occupational History  ? Not on file  ?Tobacco Use  ? Smoking status: Former  ?  Types: Cigarettes  ?  Quit date: 07/23/2007  ?  Years since quitting: 14.6  ? Smokeless tobacco: Never  ?Vaping Use  ? Vaping Use: Never used  ?Substance  and Sexual Activity  ? Alcohol use: Not Currently  ?  Comment: none for 5 months  ? Drug use: Never  ? Sexual activity: Not on file  ?Other Topics Concern  ? Not on file  ?Social History Narrative  ? Moved from [place]; lives in [place] in 2021; daughter lives next door. Quit smoking- 13 years ago. Social alcohol. 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   ? ?Social Determinants of Health  ? ?Financial Resource Strain: Not on file  ?Food Insecurity: Not on file  ?Transportation Needs: Not on file  ?Physical Activity: Not on file  ?Stress: Not on file  ?Social Connections: Not on file  ?Intimate Partner Violence: Not on file  ? ? ?FAMILY HISTORY: ?Family History  ?Problem Relation Age of Onset  ? Liver cancer Sister   ? Cancer - Other Brother   ?     bone cancer [2001]  ? ? ?ALLERGIES:  is allergic to niacin and related. ? ? ?MEDICATIONS:  ?Current Outpatient Medications  ?Medication Sig Dispense Refill  ? acetaminophen (TYLENOL) 500 MG tablet Take 2 tablets by mouth every 8 (eight) hours as needed.    ? aspirin 325 MG EC tablet Take 1 tablet by mouth daily.    ? atenolol (TENORMIN) 25 MG tablet Take 1 tablet by mouth daily.    ? atorvastatin (LIPITOR) 40 MG tablet Take 1 tablet by mouth daily.    ? hydrochlorothiazide (HYDRODIURIL) 12.5 MG tablet Take 12.5 mg by mouth daily.    ? lidocaine-prilocaine (EMLA) cream Apply 1 application topically as needed. 30 g 4  ? losartan (COZAAR) 50 MG tablet Take 1 tablet by mouth daily.    ? Melatonin 10 MG TABS Take 1 tablet by mouth at bedtime.    ? metFORMIN (GLUCOPHAGE) 500 MG tablet TAKE ONE TABLET BY MOUTH TWICE A DAY WITH A MEAL 60 tablet 3  ? Omega-3 1000 MG CAPS Take 1,000 mg by mouth in the morning and at bedtime.    ? omeprazole (PRILOSEC) 20 MG capsule Take 20 mg by mouth in the morning and at bedtime.    ? ondansetron (ZOFRAN) 8 MG tablet One pill every 8 hours as needed for nausea/vomitting. 40 tablet 1  ? prochlorperazine (COMPAZINE) 10 MG  tablet Take 1 tablet (10 mg total) by mouth every 6 (six) hours as needed for nausea or vomiting. 40 tablet 1  ? tadalafil (CIALIS) 20 MG tablet Take 1 tablet by mouth daily.    ? ?No current facility-administered medications for this visit.  ? ?Facility-Administered Medications Ordered in Other Visits  ?Medication Dose Route Frequency Provider Last Rate Last Admin  ? heparin lock flush 100 unit/mL  500 Units Intracatheter Once PRN Cammie Sickle, MD      ? prochlorperazine (COMPAZINE) tablet 10 mg  10 mg Oral Once Cammie Sickle, MD      ?. ? ?PHYSICAL EXAMINATION: ?ECOG PERFORMANCE STATUS: 1 - Symptomatic but completely ambulatory ? ?Vitals:  ? 03/27/22 1312  ?BP: 117/60  ?Pulse: (!) 54  ?Temp: 97.8 ?F (36.6 ?C)  ?SpO2: 98%  ? ?Filed Weights  ?  03/27/22 1312  ?Weight: 247 lb 6.4 oz (112.2 kg)  ? ? ?Physical Exam ?Constitutional:   ?   Comments: Ambulating independently.  Accompanied by his wife.   ?HENT:  ?   Head: Normocephalic and atraumatic.  ?   Mouth/Throat:  ?   Pharynx: No oropharyngeal exudate.  ?Eyes:  ?   Pupils: Pupils are equal, round, and reactive to light.  ?Cardiovascular:  ?   Rate and Rhythm: Normal rate and regular rhythm.  ?Pulmonary:  ?   Effort: Pulmonary effort is normal. No respiratory distress.  ?   Breath sounds: Normal breath sounds. No wheezing.  ?Abdominal:  ?   General: Bowel sounds are normal. There is no distension.  ?   Palpations: Abdomen is soft. There is no mass.  ?   Tenderness: There is no abdominal tenderness. There is no guarding or rebound.  ?Musculoskeletal:     ?   General: No tenderness. Normal range of motion.  ?   Cervical back: Normal range of motion and neck supple.  ?Skin: ?   General: Skin is warm.  ?Neurological:  ?   Mental Status: He is alert and oriented to person, place, and time.  ?Psychiatric:     ?   Mood and Affect: Affect normal.  ? ? ? ?LABORATORY DATA:  ?I have reviewed the data as listed ?Lab Results  ?Component Value Date  ? WBC 6.2  03/06/2022  ? HGB 13.9 03/06/2022  ? HCT 41.4 03/06/2022  ? MCV 90.2 03/06/2022  ? PLT 163 03/06/2022  ? ?Recent Labs  ?  01/23/22 ?1230 02/13/22 ?1011 03/06/22 ?1212  ?NA 134* 135 133*  ?K 4.1 3.9 4.1

## 2022-03-28 ENCOUNTER — Encounter: Payer: Self-pay | Admitting: Internal Medicine

## 2022-04-13 IMAGING — CT NM PET TUM IMG INITIAL (PI) SKULL BASE T - THIGH
1 of 9 series · 1 of 25 positions shown · non-contrast
Comparison: Chest CT 06/28/2020

CLINICAL DATA: Initial treatment strategy for chest CT
demonstrating right lower lobe lung mass, thoracic adenopathy.

EXAM:
NUCLEAR MEDICINE PET SKULL BASE TO THIGH
TECHNIQUE: 12.6 mCi F-18 FDG was injected intravenously. Full-ring PET imaging
was performed from the skull base to thigh after the radiotracer. CT
data was obtained and used for attenuation correction and anatomic
localization.
Fasting blood glucose: 103 mg/dl

[Series 3: ct wb 5.0 b30f · axial · 5.0mm · 0.98mm/px · 1 of 329 slices shown]
[im 329/329  brain]
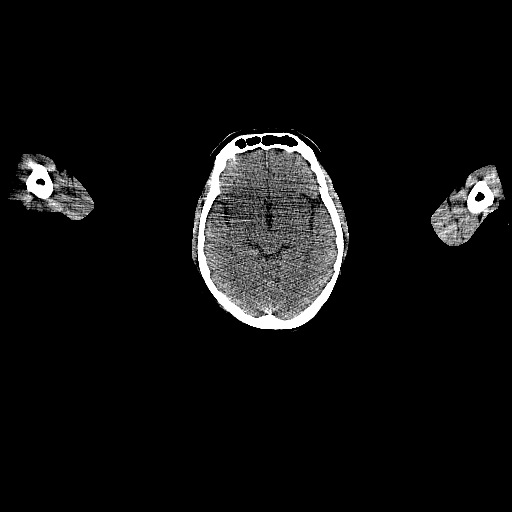

[1 of 25 positions shown; findings below may reference images not displayed]

FINDINGS: Mediastinal blood pool activity: SUV max

Liver activity: SUV max NA

NECK: No areas of abnormal hypermetabolism.

Incidental CT findings: Right maxillary sinus mucous retention cyst
or polyp. No cervical adenopathy. Left carotid atherosclerosis.

CHEST: Bilateral mediastinal hypermetabolic adenopathy. Example AP
window node at 1.2 cm and a S.U.V. max of 11.4 on 79/3.

Subcarinal nodal mass measures 2.6 cm and a S.U.V. max of 13.7 on
93/3. Similar to on the prior exam.

The central right lower lobe lung mass with direct mediastinal
invasion measures 8.8 x 6.7 cm and a S.U.V. max of 18.8 on 102/3.

Incidental CT findings: Aortic and coronary artery atherosclerosis.

ABDOMEN/PELVIS: Left adrenal nodule measures 9 mm and a S.U.V. max
of 7.1 on 143/3. This is similar in size to on the prior diagnostic
CT. No abdominopelvic nodal hypermetabolism. Low anal
hypermetabolism is without CT correlate and favored to be
physiologic.

Incidental CT findings: Aortic atherosclerosis. An upper pole right
renal 1.4 cm lesion measures 30 HU on 148/3, up to 76 HU on the
prior, postcontrast CT.

An upper pole left renal lesion measures 25 HU on 148/3, up to 53 HU
on the prior diagnostic CT. 1.4 cm.

SKELETON: No abnormal marrow activity.

Incidental CT findings: No acute osseous abnormality.
IMPRESSION: 1. Right lower lobe primary bronchogenic carcinoma with bilateral
mediastinal nodal and left adrenal metastasis.
2. Bilateral indeterminate renal lesions. Suspicious for solid renal
cell carcinoma or carcinomas, given comparison to prior contrast
enhanced chest CT. These could either be re-evaluated at follow-up
or more entirely characterized with pre and post contrast renal
protocol CT or MRI.
3. Incidental findings, including: Coronary artery atherosclerosis.
Aortic Atherosclerosis ([id number]). Sinus disease.

## 2022-04-17 ENCOUNTER — Inpatient Hospital Stay: Payer: Medicare PPO

## 2022-04-17 ENCOUNTER — Inpatient Hospital Stay: Payer: Medicare PPO | Admitting: Internal Medicine

## 2022-04-17 DIAGNOSIS — C3431 Malignant neoplasm of lower lobe, right bronchus or lung: Secondary | ICD-10-CM

## 2022-04-17 DIAGNOSIS — Z5112 Encounter for antineoplastic immunotherapy: Secondary | ICD-10-CM | POA: Diagnosis not present

## 2022-04-17 LAB — COMPREHENSIVE METABOLIC PANEL
ALT: 35 U/L (ref 0–44)
AST: 31 U/L (ref 15–41)
Albumin: 3.9 g/dL (ref 3.5–5.0)
Alkaline Phosphatase: 70 U/L (ref 38–126)
Anion gap: 5 (ref 5–15)
BUN: 29 mg/dL — ABNORMAL HIGH (ref 8–23)
CO2: 23 mmol/L (ref 22–32)
Calcium: 8.4 mg/dL — ABNORMAL LOW (ref 8.9–10.3)
Chloride: 104 mmol/L (ref 98–111)
Creatinine, Ser: 1.4 mg/dL — ABNORMAL HIGH (ref 0.61–1.24)
GFR, Estimated: 51 mL/min — ABNORMAL LOW (ref >60)
Glucose, Bld: 95 mg/dL (ref 70–99)
Potassium: 4 mmol/L (ref 3.5–5.1)
Sodium: 132 mmol/L — ABNORMAL LOW (ref 135–145)
Total Bilirubin: 0.6 mg/dL (ref 0.3–1.2)
Total Protein: 7.2 g/dL (ref 6.5–8.1)

## 2022-04-17 LAB — CBC WITH DIFFERENTIAL/PLATELET
Abs Immature Granulocytes: 0.01 10*3/uL (ref 0.00–0.07)
Basophils Absolute: 0 10*3/uL (ref 0.0–0.1)
Basophils Relative: 0 %
Eosinophils Absolute: 0.1 10*3/uL (ref 0.0–0.5)
Eosinophils Relative: 2 %
HCT: 41.5 % (ref 39.0–52.0)
Hemoglobin: 14.3 g/dL (ref 13.0–17.0)
Immature Granulocytes: 0 %
Lymphocytes Relative: 42 %
Lymphs Abs: 2.3 10*3/uL (ref 0.7–4.0)
MCH: 31 pg (ref 26.0–34.0)
MCHC: 34.5 g/dL (ref 30.0–36.0)
MCV: 90 fL (ref 80.0–100.0)
Monocytes Absolute: 0.5 10*3/uL (ref 0.1–1.0)
Monocytes Relative: 9 %
Neutro Abs: 2.6 10*3/uL (ref 1.7–7.7)
Neutrophils Relative %: 47 %
Platelets: 163 10*3/uL (ref 150–400)
RBC: 4.61 MIL/uL (ref 4.22–5.81)
RDW: 14.3 % (ref 11.5–15.5)
WBC: 5.5 10*3/uL (ref 4.0–10.5)
nRBC: 0 % (ref 0.0–0.2)

## 2022-04-17 MED ORDER — SODIUM CHLORIDE 0.9 % IV SOLN
200.0000 mg | Freq: Once | INTRAVENOUS | Status: AC
  Administered 2022-04-17: 200 mg via INTRAVENOUS
  Filled 2022-04-17: qty 8

## 2022-04-17 MED ORDER — SODIUM CHLORIDE 0.9 % IV SOLN
Freq: Once | INTRAVENOUS | Status: AC
  Filled 2022-04-17: qty 250

## 2022-04-17 MED ORDER — HEPARIN SOD (PORK) LOCK FLUSH 100 UNIT/ML IV SOLN
INTRAVENOUS | Status: AC
  Filled 2022-04-17: qty 5

## 2022-04-17 MED ORDER — PROCHLORPERAZINE MALEATE 10 MG PO TABS: 10.0000 mg | ORAL_TABLET | Freq: Once | ORAL | Status: DC

## 2022-04-17 MED ORDER — HEPARIN SOD (PORK) LOCK FLUSH 100 UNIT/ML IV SOLN
500.0000 [IU] | Freq: Once | INTRAVENOUS | Status: AC | PRN
  Administered 2022-04-17: 500 [IU]
  Filled 2022-04-17: qty 5

## 2022-04-17 NOTE — Progress Notes (Signed)
[facility] ?CONSULT NOTE ? ?Patient Care Team: ?Cox, Okey Regal, MD as PCP - General (Family Medicine) ?Telford Nab, RN as Sales executive ? ?CHIEF COMPLAINTS/PURPOSE OF CONSULTATION: lung cancer ? ?#  ?Oncology History Overview Note  ?# July 2021- RLL- 9.4 cm x 7.5 cm x 6.7 cm lobulated low-attenuation heterogeneous lung mass within the posteromedial aspect of the right lower lobe, at the level of the right hilum; positive for hilar; mediastinal adenopathy; PET scan-July 2021 + for adrenal metastases; CT-guided biopsy-non-small cell favor adenocarcinoma; necrosis ? ?# carbo-alimtac  [name]; NGS pending] cycle #2 carbo Alimta Keytruda; 10/18/2020-maintenance Keytruda ? ?# 4 mm cervical sp[ine vertebral body-indeterminate lesion monitor for now ? ?# CAD- 1995 s/p cath; No stents; # ? CKD ? ?# NGS/MOLECULAR TESTS: TPS =70%; rest ** ? ?# PALLIATIVE CARE EVALUATION:  ? ? ? ?DIAGNOSIS: Lung cancer ? ?STAGE: 4       ;  GOALS: Palliative ? ?CURRENT/MOST RECENT THERAPY : Carbo Alimta Keytruda ? ?  ?Cancer of lower lobe of right lung ([place])  ?07/05/2020 Initial Diagnosis  ? Cancer of lower lobe of right lung [facility]) ?  ?07/26/2020 -  Chemotherapy  ? Patient is on Treatment Plan : LUNG CARBOplatin / Pemetrexed / Pembrolizumab q21d Induction x 4 cycles / Maintenance Pemetrexed + Pembrolizumab  ? ?  ?  ? ? ?HISTORY OF PRESENTING ILLNESS: Ambulating independently.  Accompanied by his wife. ? ?Ralph Williams 81 y.o.  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 ?Overall doing well.  He has established care with oncology locally in [place] area.  ? ?No nausea no vomiting.  No cough.  No chest pain.  No headaches.  Denies any fatigue.  No constipation no diarrhea. ? ?Review of Systems  ?Constitutional:  Negative for chills, diaphoresis, fever, malaise/fatigue and weight loss.  ?HENT:  Negative for nosebleeds and sore  throat.   ?Eyes:  Negative for double vision.  ?Respiratory:  Negative for hemoptysis, sputum production and shortness of breath.   ?Cardiovascular:  Negative for chest pain, palpitations, orthopnea and leg swelling.  ?Gastrointestinal:  Negative for abdominal pain, blood in stool, constipation, diarrhea, heartburn, melena, nausea and vomiting.  ?Genitourinary:  Negative for dysuria, frequency and urgency.  ?Musculoskeletal:  Negative for back pain and joint pain.  ?Neurological:  Negative for dizziness, tingling, focal weakness, weakness and headaches.  ?Endo/Heme/Allergies:  Does not bruise/bleed easily.  ?Psychiatric/Behavioral:  Negative for depression. The patient is not nervous/anxious.    ? ?MEDICAL HISTORY:  ?Past Medical History:  ?Diagnosis Date  ? Chronic renal insufficiency   ? Diabetes mellitus without complication ([place])   ? Pt states it's diet controlled.  ? GERD (gastroesophageal reflux disease)   ? Hyperlipidemia associated with type 2 diabetes mellitus ([place])   ? Hypertension   ? Lung mass   ? Non-small cell lung cancer, right ([place]) 06/2020  ? ? ?SURGICAL HISTORY: ?Past Surgical History:  ?Procedure Laterality Date  ? IR IMAGING GUIDED PORT INSERTION  07/22/2020  ? 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 ? ?SOCIAL HISTORY: ?Social History  ? ?Socioeconomic History  ? Marital status: Married  ?  Spouse name: Not on file  ? Number of children: Not on file  ? Years of education: Not on file  ? Highest education level: Not on file  ?Occupational History  ? Not on file  ?Tobacco Use  ? Smoking status: Former  ?  Types: Cigarettes  ?  Quit date: 07/23/2007  ?  Years since quitting: 14.7  ? Smokeless tobacco: Never  ?Vaping Use  ? Vaping Use: Never used  ?Substance and Sexual Activity  ? Alcohol use: Not Currently  ?  Comment: none for 5 months  ? Drug use: Never  ? Sexual activity: Not on file  ?Other Topics Concern  ? Not on file  ?Social History Narrative  ? Moved from [place]; lives in [place] in 2021; daughter  lives next door. Quit smoking- 13 years ago. Social alcohol. 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   ? ?Social Determinants of Health  ? ?Financial Resource Strain: Not on file  ?Food Insecurity: Not on file  ?Transportation Needs: Not on file  ?Physical Activity: Not on file  ?Stress: Not on file  ?Social Connections: Not on file  ?Intimate Partner Violence: Not on file  ? ? ?FAMILY HISTORY: ?Family History  ?Problem Relation Age of Onset  ? Liver cancer Sister   ? Cancer - Other Brother   ?     bone cancer [2001]  ? ? ?ALLERGIES:  is allergic to niacin and related. ? ?MEDICATIONS:  ?Current Outpatient Medications  ?Medication Sig Dispense Refill  ? acetaminophen (TYLENOL) 500 MG tablet Take 2 tablets by mouth every 8 (eight) hours as needed.    ? aspirin 325 MG EC tablet Take 1 tablet by mouth daily.    ? atenolol (TENORMIN) 25 MG tablet Take 1 tablet by mouth daily.    ? atorvastatin (LIPITOR) 40 MG tablet Take 1 tablet by mouth daily.    ? hydrochlorothiazide (HYDRODIURIL) 12.5 MG tablet Take 12.5 mg by mouth daily.    ? lidocaine-prilocaine (EMLA) cream Apply 1 application topically as needed. 30 g 4  ? losartan (COZAAR) 50 MG tablet Take 1 tablet by mouth daily.    ? Melatonin 10 MG TABS Take 1 tablet by mouth at bedtime.    ? metFORMIN (GLUCOPHAGE) 500 MG tablet TAKE ONE TABLET BY MOUTH TWICE A DAY WITH A MEAL 60 tablet 3  ? Omega-3 1000 MG CAPS Take 1,000 mg by mouth in the morning and at bedtime.    ? omeprazole (PRILOSEC) 20 MG capsule Take 20 mg by mouth in the morning and at bedtime.    ? ondansetron (ZOFRAN) 8 MG tablet One pill every 8 hours as needed for nausea/vomitting. 40 tablet 1  ? prochlorperazine (COMPAZINE) 10 MG tablet Take 1 tablet (10 mg total) by mouth every 6 (six) hours as needed for nausea or vomiting. 40 tablet 1  ? tadalafil (CIALIS) 20 MG tablet Take 1 tablet by mouth daily.    ? ?No current facility-administered medications for this visit.   ? ?Facility-Administered Medications Ordered in Other Visits  ?Medication Dose Route Frequency Provider Last Rate Last Admin  ? 0.9 %  sodium chloride infusion   Intravenous Once Charlaine Dalton R, MD      ? heparin lock flush 100 unit/mL  500 Units Intracatheter Once PRN Cammie Sickle, MD      ? heparin lock flush 100 unit/mL  500 Units Intracatheter Once PRN Cammie Sickle, MD      ? pembrolizumab [facility]) 200 mg in sodium chloride 0.9 % 50 mL chemo infusion  200 mg Intravenous Once Cammie Sickle, MD      ? prochlorperazine (COMPAZINE) tablet 10 mg  10 mg Oral Once Cammie Sickle, MD      ? prochlorperazine (COMPAZINE) tablet 10  mg  10 mg Oral Once Cammie Sickle, MD      ? ? ?  ?. ? ?PHYSICAL EXAMINATION: ?ECOG PERFORMANCE STATUS: 1 - Symptomatic but completely ambulatory ? ?Vitals:  ? 04/17/22 1300  ?BP: 116/62  ?Pulse: (!) 57  ?Temp: (!) 96.3 ?F (35.7 ?C)  ? ?Filed Weights  ? 04/17/22 1300  ?Weight: 242 lb 3.2 oz (109.9 kg)  ? ? ? ?Physical Exam ?Constitutional:   ?   Comments: Ambulating independently.  Accompanied by his wife.   ?HENT:  ?   Head: Normocephalic and atraumatic.  ?   Mouth/Throat:  ?   Pharynx: No oropharyngeal exudate.  ?Eyes:  ?   Pupils: Pupils are equal, round, and reactive to light.  ?Cardiovascular:  ?   Rate and Rhythm: Normal rate and regular rhythm.  ?Pulmonary:  ?   Effort: Pulmonary effort is normal. No respiratory distress.  ?   Breath sounds: Normal breath sounds. No wheezing.  ?Abdominal:  ?   General: Bowel sounds are normal. There is no distension.  ?   Palpations: Abdomen is soft. There is no mass.  ?   Tenderness: There is no abdominal tenderness. There is no guarding or rebound.  ?Musculoskeletal:     ?   General: No tenderness. Normal range of motion.  ?   Cervical back: Normal range of motion and neck supple.  ?Skin: ?   General: Skin is warm.  ?Neurological:  ?   Mental Status: He is alert and oriented to person, place, and time.   ?Psychiatric:     ?   Mood and Affect: Affect normal.  ? ? ? ?LABORATORY DATA:  ?I have reviewed the data as listed ?Lab Results  ?Component Value Date  ? WBC 5.5 04/17/2022  ? HGB 14.3 04/17/2022  ? HCT 41.5 04/17/2022  ?

## 2022-04-17 NOTE — Progress Notes (Signed)
Patient denies new problems/concerns today.   °

## 2022-04-17 NOTE — Patient Instructions (Signed)
[facility] CANCER CTR AT [place]  Discharge Instructions: ?Thank you for choosing [facility] to provide your oncology and hematology care.  ?If you have a lab appointment with the [facility], please go directly to the [facility] and check in at the registration area. ? ?Wear comfortable clothing and clothing appropriate for easy access to any Portacath or PICC line.  ? ?We strive to give you quality time with your provider. You may need to reschedule your appointment if you arrive late (15 or more minutes).  Arriving late affects you and other patients whose appointments are after yours.  Also, if you miss three or more appointments without notifying the office, you may be dismissed from the clinic at the provider?s discretion.    ?  ?For prescription refill requests, have your pharmacy contact our office and allow 72 hours for refills to be completed.   ? ?Today you received the following chemotherapy and/or immunotherapy agents Keytruda    ?  ?To help prevent nausea and vomiting after your treatment, we encourage you to take your nausea medication as directed. ? ?BELOW ARE SYMPTOMS THAT SHOULD BE REPORTED IMMEDIATELY: ?*FEVER GREATER THAN 100.4 F (38 ?C) OR HIGHER ?*CHILLS OR SWEATING ?*NAUSEA AND VOMITING THAT IS NOT CONTROLLED WITH YOUR NAUSEA MEDICATION ?*UNUSUAL SHORTNESS OF BREATH ?*UNUSUAL BRUISING OR BLEEDING ?*URINARY PROBLEMS (pain or burning when urinating, or frequent urination) ?*BOWEL PROBLEMS (unusual diarrhea, constipation, pain near the anus) ?TENDERNESS IN MOUTH AND THROAT WITH OR WITHOUT PRESENCE OF ULCERS (sore throat, sores in mouth, or a toothache) ?UNUSUAL RASH, SWELLING OR PAIN  ?UNUSUAL VAGINAL DISCHARGE OR ITCHING  ? ?Items with * indicate a potential emergency and should be followed up as soon as possible or go to the Emergency Department if any problems should occur. ? ?Please show the CHEMOTHERAPY ALERT CARD or IMMUNOTHERAPY ALERT CARD at check-in to  the Emergency Department and triage nurse. ? ?Should you have questions after your visit or need to cancel or reschedule your appointment, please contact [facility] CANCER [place] AT [place]  [phone] and follow the prompts.  Office hours are 8:00 a.m. to 4:30 p.m. Monday - Friday. Please note that voicemails left after 4:00 p.m. may not be returned until the following business day.  We are closed weekends and major holidays. You have access to a nurse at all times for urgent questions. Please call the main number to the clinic [phone] and follow the prompts. ? ?For any non-urgent questions, you may also contact your provider using MyChart. We now offer e-Visits for anyone 28 and older to request care online for non-urgent symptoms. For details visit [email or web address]. ?  ?Also download the MyChart app! Go to the app store, search "MyChart", open the app, select [facility], and log in with your MyChart username and password. ? ?Due to Covid, a mask is required upon entering the hospital/clinic. If you do not have a mask, one will be given to you upon arrival. For doctor visits, patients may have 1 support person aged 70 or older with them. For treatment visits, patients cannot have anyone with them due to current Covid guidelines and our immunocompromised population.  ?

## 2022-04-17 NOTE — Assessment & Plan Note (Signed)
#   Lung cancer-non-small cell favor adeno ca; Stage IV [adrenal metastases] CT scan- FEB, 23rd-  2023-Stable Post treatment scarring retro hilar right lung similar to prior. No new or progressive findings to suggest recurrent or metastatic disease. ? ?# proceed with Keytruda maintenance Labs today reviewed;  acceptable for treatment today.proceed with further treatment/surveilaince with local oncology/in [place].  ? ?# Anemia-mild-14-/suspect CKD. Iron studies-IDA. continue gentle iron; ? Colonoscopy- awaiting GI referral/PCP.  ? 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  [No Biopsy] [s/p evaluation with urology at [facility]-/March 2023 for an MRI of the abdomen with and without contrast. Exophytic mass anterior upper pole left kidney measuring 2.1 x 2.2 cm has mildly increased in size since the 2022 exam where dimensions were 1.8 x 1.8 cc].  As per urology awaiting repeat MRI in September 2023 -consider ablation if worsening.  ? 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  ? ?# DM-2;PBF- 104; MAY 2022-Hb1a C-6.5. On Metformin 500 mg BID; refilled;  monitor now- STABLE. ? ?# DISPOSITION:  ?# Keytruda today ?# follow up as needed-;--Dr.B ? ? ? ? ?

## 2022-04-18 IMAGING — DX DG CHEST 1V PORT
1 series · 1 of 1 positions shown · non-contrast
Comparison: 06/11/2020; chest CT-06/28/2020; PET-CT-07/11/2020

CLINICAL DATA: Post right lower lobe pulmonary mass biopsy.

EXAM:
PORTABLE CHEST 1 VIEW

[chest ap]
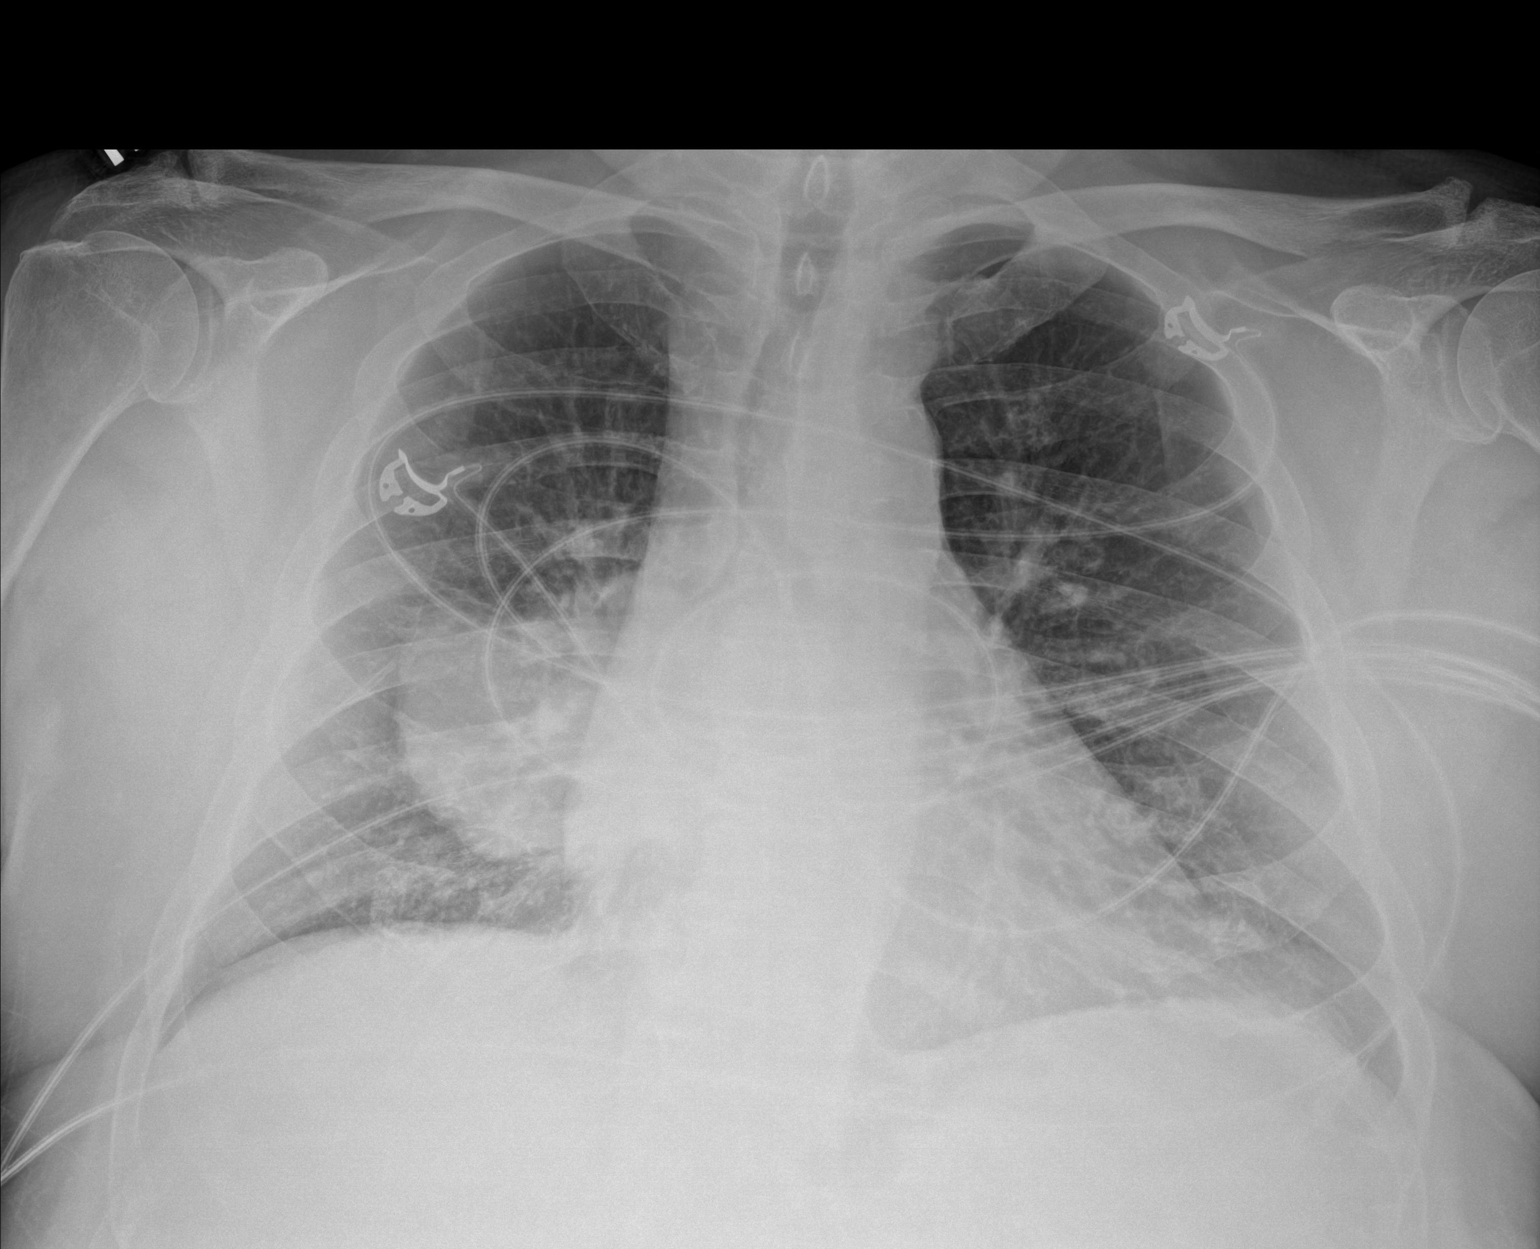

[1 of 1 positions shown; findings below may reference images not displayed]

FINDINGS: Grossly unchanged cardiac silhouette and mediastinal contours with
persistent partial obscuration of the right heart border secondary
to known large right lower lobe pulmonary mass. No evidence of
complication following recent right lower lobe pulmonary mass
biopsy. Specifically, no pneumothorax or pleural effusion.

No new focal airspace opacities. No evidence of edema. No acute
osseous abnormalities. Degenerative change the bilateral
glenohumeral joints is suspected though incompletely evaluated.
IMPRESSION: No evidence of complication following right lower lobe pulmonary
mass biopsy. Specifically, no pneumothorax.

## 2022-04-23 ENCOUNTER — Telehealth: Payer: Self-pay | Admitting: *Deleted

## 2022-04-23 NOTE — Telephone Encounter (Signed)
Got a call from [facility] in [place] that patient has moved back to [facility] and is now being seen in the office there.  Pam at that office is saying that we still have the authorization active through [facility] for his Beryle Flock.  She needs somebody from authorization to call in and cancel that so that they are able to get the approval to do it down in [place].  I have sent a message to Barnett Hatter who helps [place] with authorizations to see if she could help in this matter.  The telephone number to [facility] is [phone]. ?Seth Bake sent me a message back that she will take care of it and call Pam to get it done for the patient ?

## 2022-10-08 ENCOUNTER — Other Ambulatory Visit: Payer: Self-pay | Admitting: Internal Medicine

## 2022-10-09 ENCOUNTER — Encounter: Payer: Self-pay | Admitting: Internal Medicine
